# Patient Record
Sex: Female | Born: 1935
Health system: Southern US, Community
[De-identification: ages and names within clinical notes are randomized; demographics above are authoritative.]

## PROBLEM LIST (undated history)

## (undated) DIAGNOSIS — I517 Cardiomegaly: Secondary | ICD-10-CM

## (undated) DIAGNOSIS — I340 Nonrheumatic mitral (valve) insufficiency: Secondary | ICD-10-CM

## (undated) DIAGNOSIS — E538 Deficiency of other specified B group vitamins: Secondary | ICD-10-CM

## (undated) DIAGNOSIS — Z87891 Personal history of nicotine dependence: Secondary | ICD-10-CM

## (undated) DIAGNOSIS — I7090 Unspecified atherosclerosis: Secondary | ICD-10-CM

## (undated) DIAGNOSIS — I519 Heart disease, unspecified: Secondary | ICD-10-CM

## (undated) DIAGNOSIS — I10 Essential (primary) hypertension: Secondary | ICD-10-CM

## (undated) DIAGNOSIS — N183 Chronic kidney disease, stage 3 unspecified: Secondary | ICD-10-CM

## (undated) DIAGNOSIS — I739 Peripheral vascular disease, unspecified: Secondary | ICD-10-CM

## (undated) DIAGNOSIS — E785 Hyperlipidemia, unspecified: Secondary | ICD-10-CM

## (undated) DIAGNOSIS — E669 Obesity, unspecified: Secondary | ICD-10-CM

## (undated) DIAGNOSIS — E559 Vitamin D deficiency, unspecified: Secondary | ICD-10-CM

## (undated) DIAGNOSIS — R7301 Impaired fasting glucose: Secondary | ICD-10-CM

## (undated) DIAGNOSIS — I701 Atherosclerosis of renal artery: Secondary | ICD-10-CM

## (undated) DIAGNOSIS — I071 Rheumatic tricuspid insufficiency: Secondary | ICD-10-CM

## (undated) DIAGNOSIS — I2510 Atherosclerotic heart disease of native coronary artery without angina pectoris: Secondary | ICD-10-CM

## (undated) HISTORY — DX: Rheumatic tricuspid insufficiency: I07.1

## (undated) HISTORY — DX: Essential (primary) hypertension: I10

## (undated) HISTORY — PX: LAPAROSCOPY: SHX197

## (undated) HISTORY — DX: Personal history of nicotine dependence: Z87.891

## (undated) HISTORY — DX: Deficiency of other specified B group vitamins: E53.8

## (undated) HISTORY — DX: Cardiomegaly: I51.7

## (undated) HISTORY — DX: Heart disease, unspecified: I51.9

## (undated) HISTORY — DX: Atherosclerotic heart disease of native coronary artery without angina pectoris: I25.10

## (undated) HISTORY — DX: Peripheral vascular disease, unspecified: I73.9

## (undated) HISTORY — DX: Chronic kidney disease, stage 3 (moderate): N18.3

## (undated) HISTORY — DX: Obesity, unspecified: E66.9

## (undated) HISTORY — DX: Vitamin D deficiency, unspecified: E55.9

## (undated) HISTORY — DX: Unspecified atherosclerosis: I70.90

## (undated) HISTORY — DX: Hyperlipidemia, unspecified: E78.5

## (undated) HISTORY — DX: Atherosclerosis of renal artery: I70.1

## (undated) HISTORY — DX: Impaired fasting glucose: R73.01

## (undated) HISTORY — DX: Nonrheumatic mitral (valve) insufficiency: I34.0

## (undated) HISTORY — DX: Chronic kidney disease, stage 3 unspecified: N18.30

## (undated) HISTORY — PX: CERVICAL DISCECTOMY: SHX98

---

## 2001-09-06 HISTORY — PX: ILIAC ARTERY STENT: SHX1786

## 2002-10-07 HISTORY — PX: OTHER SURGICAL HISTORY: SHX169

## 2004-10-06 ENCOUNTER — Ambulatory Visit: Payer: Self-pay | Admitting: Family Medicine

## 2005-07-05 ENCOUNTER — Ambulatory Visit: Payer: Self-pay | Admitting: Family Medicine

## 2005-12-16 ENCOUNTER — Ambulatory Visit: Payer: Self-pay | Admitting: Family Medicine

## 2007-01-03 ENCOUNTER — Ambulatory Visit: Payer: Self-pay | Admitting: Family Medicine

## 2008-03-05 ENCOUNTER — Ambulatory Visit: Payer: Self-pay | Admitting: Family Medicine

## 2009-03-06 ENCOUNTER — Ambulatory Visit: Payer: Self-pay | Admitting: Family Medicine

## 2011-09-27 DIAGNOSIS — E7800 Pure hypercholesterolemia, unspecified: Secondary | ICD-10-CM | POA: Diagnosis not present

## 2011-09-27 DIAGNOSIS — Z79899 Other long term (current) drug therapy: Secondary | ICD-10-CM | POA: Diagnosis not present

## 2011-09-29 DIAGNOSIS — I10 Essential (primary) hypertension: Secondary | ICD-10-CM | POA: Diagnosis not present

## 2012-01-07 DIAGNOSIS — H40019 Open angle with borderline findings, low risk, unspecified eye: Secondary | ICD-10-CM | POA: Diagnosis not present

## 2012-02-07 DIAGNOSIS — E7800 Pure hypercholesterolemia, unspecified: Secondary | ICD-10-CM | POA: Diagnosis not present

## 2012-02-07 DIAGNOSIS — R7989 Other specified abnormal findings of blood chemistry: Secondary | ICD-10-CM | POA: Diagnosis not present

## 2012-02-09 DIAGNOSIS — I10 Essential (primary) hypertension: Secondary | ICD-10-CM | POA: Diagnosis not present

## 2012-02-11 DIAGNOSIS — M76899 Other specified enthesopathies of unspecified lower limb, excluding foot: Secondary | ICD-10-CM | POA: Diagnosis not present

## 2012-06-14 DIAGNOSIS — Z23 Encounter for immunization: Secondary | ICD-10-CM | POA: Diagnosis not present

## 2013-01-17 DIAGNOSIS — H40019 Open angle with borderline findings, low risk, unspecified eye: Secondary | ICD-10-CM | POA: Diagnosis not present

## 2013-01-17 DIAGNOSIS — H2510 Age-related nuclear cataract, unspecified eye: Secondary | ICD-10-CM | POA: Diagnosis not present

## 2013-02-22 DIAGNOSIS — E669 Obesity, unspecified: Secondary | ICD-10-CM | POA: Diagnosis not present

## 2013-02-22 DIAGNOSIS — I10 Essential (primary) hypertension: Secondary | ICD-10-CM | POA: Diagnosis not present

## 2013-02-22 DIAGNOSIS — E7800 Pure hypercholesterolemia, unspecified: Secondary | ICD-10-CM | POA: Diagnosis not present

## 2013-02-22 DIAGNOSIS — R7301 Impaired fasting glucose: Secondary | ICD-10-CM | POA: Diagnosis not present

## 2013-02-22 DIAGNOSIS — Z79899 Other long term (current) drug therapy: Secondary | ICD-10-CM | POA: Diagnosis not present

## 2013-02-22 DIAGNOSIS — F17200 Nicotine dependence, unspecified, uncomplicated: Secondary | ICD-10-CM | POA: Diagnosis not present

## 2013-03-21 DIAGNOSIS — I10 Essential (primary) hypertension: Secondary | ICD-10-CM | POA: Diagnosis not present

## 2013-03-21 DIAGNOSIS — E7800 Pure hypercholesterolemia, unspecified: Secondary | ICD-10-CM | POA: Diagnosis not present

## 2013-05-31 DIAGNOSIS — Z23 Encounter for immunization: Secondary | ICD-10-CM | POA: Diagnosis not present

## 2013-09-20 DIAGNOSIS — E7800 Pure hypercholesterolemia, unspecified: Secondary | ICD-10-CM | POA: Diagnosis not present

## 2013-09-20 DIAGNOSIS — F17200 Nicotine dependence, unspecified, uncomplicated: Secondary | ICD-10-CM | POA: Diagnosis not present

## 2013-09-20 DIAGNOSIS — I10 Essential (primary) hypertension: Secondary | ICD-10-CM | POA: Diagnosis not present

## 2013-09-20 DIAGNOSIS — R7301 Impaired fasting glucose: Secondary | ICD-10-CM | POA: Diagnosis not present

## 2014-01-15 DIAGNOSIS — H40019 Open angle with borderline findings, low risk, unspecified eye: Secondary | ICD-10-CM | POA: Diagnosis not present

## 2014-01-15 DIAGNOSIS — H2510 Age-related nuclear cataract, unspecified eye: Secondary | ICD-10-CM | POA: Diagnosis not present

## 2014-03-18 DIAGNOSIS — I10 Essential (primary) hypertension: Secondary | ICD-10-CM | POA: Diagnosis not present

## 2014-03-18 DIAGNOSIS — R7301 Impaired fasting glucose: Secondary | ICD-10-CM | POA: Diagnosis not present

## 2014-03-18 DIAGNOSIS — E7800 Pure hypercholesterolemia, unspecified: Secondary | ICD-10-CM | POA: Diagnosis not present

## 2014-05-21 DIAGNOSIS — R3915 Urgency of urination: Secondary | ICD-10-CM | POA: Diagnosis not present

## 2014-05-21 DIAGNOSIS — E7800 Pure hypercholesterolemia, unspecified: Secondary | ICD-10-CM | POA: Diagnosis not present

## 2014-05-21 DIAGNOSIS — I10 Essential (primary) hypertension: Secondary | ICD-10-CM | POA: Diagnosis not present

## 2014-05-21 DIAGNOSIS — N183 Chronic kidney disease, stage 3 unspecified: Secondary | ICD-10-CM | POA: Diagnosis not present

## 2014-05-28 DIAGNOSIS — Z23 Encounter for immunization: Secondary | ICD-10-CM | POA: Diagnosis not present

## 2014-07-22 DIAGNOSIS — E559 Vitamin D deficiency, unspecified: Secondary | ICD-10-CM | POA: Diagnosis not present

## 2014-07-22 DIAGNOSIS — I10 Essential (primary) hypertension: Secondary | ICD-10-CM | POA: Diagnosis not present

## 2014-07-22 DIAGNOSIS — N183 Chronic kidney disease, stage 3 (moderate): Secondary | ICD-10-CM | POA: Diagnosis not present

## 2014-07-22 DIAGNOSIS — E669 Obesity, unspecified: Secondary | ICD-10-CM | POA: Diagnosis not present

## 2014-07-22 DIAGNOSIS — R413 Other amnesia: Secondary | ICD-10-CM | POA: Diagnosis not present

## 2014-07-22 DIAGNOSIS — E663 Overweight: Secondary | ICD-10-CM | POA: Diagnosis not present

## 2014-07-22 DIAGNOSIS — E780 Pure hypercholesterolemia: Secondary | ICD-10-CM | POA: Diagnosis not present

## 2014-08-12 DIAGNOSIS — Z0131 Encounter for examination of blood pressure with abnormal findings: Secondary | ICD-10-CM | POA: Diagnosis not present

## 2014-08-12 DIAGNOSIS — N183 Chronic kidney disease, stage 3 (moderate): Secondary | ICD-10-CM | POA: Diagnosis not present

## 2014-08-12 DIAGNOSIS — I10 Essential (primary) hypertension: Secondary | ICD-10-CM | POA: Diagnosis not present

## 2014-08-14 DIAGNOSIS — I701 Atherosclerosis of renal artery: Secondary | ICD-10-CM | POA: Diagnosis not present

## 2014-08-19 DIAGNOSIS — N183 Chronic kidney disease, stage 3 (moderate): Secondary | ICD-10-CM | POA: Diagnosis not present

## 2014-08-21 ENCOUNTER — Ambulatory Visit: Payer: Self-pay | Admitting: Family Medicine

## 2014-08-21 DIAGNOSIS — I358 Other nonrheumatic aortic valve disorders: Secondary | ICD-10-CM | POA: Diagnosis not present

## 2014-08-21 DIAGNOSIS — I071 Rheumatic tricuspid insufficiency: Secondary | ICD-10-CM | POA: Diagnosis not present

## 2014-08-21 DIAGNOSIS — I340 Nonrheumatic mitral (valve) insufficiency: Secondary | ICD-10-CM | POA: Diagnosis not present

## 2014-08-22 ENCOUNTER — Encounter: Payer: Self-pay | Admitting: *Deleted

## 2014-09-04 ENCOUNTER — Ambulatory Visit: Payer: Self-pay | Admitting: Family Medicine

## 2014-09-04 DIAGNOSIS — K760 Fatty (change of) liver, not elsewhere classified: Secondary | ICD-10-CM | POA: Diagnosis not present

## 2014-09-04 DIAGNOSIS — I2510 Atherosclerotic heart disease of native coronary artery without angina pectoris: Secondary | ICD-10-CM | POA: Diagnosis not present

## 2014-09-04 DIAGNOSIS — Z87891 Personal history of nicotine dependence: Secondary | ICD-10-CM | POA: Diagnosis not present

## 2014-09-04 DIAGNOSIS — I739 Peripheral vascular disease, unspecified: Secondary | ICD-10-CM | POA: Diagnosis not present

## 2014-09-09 DIAGNOSIS — I10 Essential (primary) hypertension: Secondary | ICD-10-CM | POA: Diagnosis not present

## 2014-09-09 DIAGNOSIS — N183 Chronic kidney disease, stage 3 (moderate): Secondary | ICD-10-CM | POA: Diagnosis not present

## 2014-09-09 DIAGNOSIS — R809 Proteinuria, unspecified: Secondary | ICD-10-CM | POA: Diagnosis not present

## 2014-09-18 DIAGNOSIS — E782 Mixed hyperlipidemia: Secondary | ICD-10-CM | POA: Diagnosis not present

## 2014-09-18 DIAGNOSIS — R079 Chest pain, unspecified: Secondary | ICD-10-CM | POA: Diagnosis not present

## 2014-09-18 DIAGNOSIS — I739 Peripheral vascular disease, unspecified: Secondary | ICD-10-CM | POA: Diagnosis not present

## 2014-09-18 DIAGNOSIS — I10 Essential (primary) hypertension: Secondary | ICD-10-CM | POA: Diagnosis not present

## 2014-09-23 DIAGNOSIS — I739 Peripheral vascular disease, unspecified: Secondary | ICD-10-CM | POA: Insufficient documentation

## 2014-09-23 DIAGNOSIS — E782 Mixed hyperlipidemia: Secondary | ICD-10-CM | POA: Insufficient documentation

## 2014-09-23 DIAGNOSIS — I10 Essential (primary) hypertension: Secondary | ICD-10-CM | POA: Insufficient documentation

## 2014-09-23 DIAGNOSIS — R0789 Other chest pain: Secondary | ICD-10-CM | POA: Diagnosis not present

## 2014-10-16 DIAGNOSIS — I739 Peripheral vascular disease, unspecified: Secondary | ICD-10-CM | POA: Diagnosis not present

## 2014-10-16 DIAGNOSIS — E782 Mixed hyperlipidemia: Secondary | ICD-10-CM | POA: Diagnosis not present

## 2014-10-16 DIAGNOSIS — I10 Essential (primary) hypertension: Secondary | ICD-10-CM | POA: Diagnosis not present

## 2014-10-28 DIAGNOSIS — I10 Essential (primary) hypertension: Secondary | ICD-10-CM | POA: Diagnosis not present

## 2014-10-28 DIAGNOSIS — E785 Hyperlipidemia, unspecified: Secondary | ICD-10-CM | POA: Diagnosis not present

## 2014-11-12 DIAGNOSIS — I10 Essential (primary) hypertension: Secondary | ICD-10-CM | POA: Diagnosis not present

## 2014-11-12 DIAGNOSIS — I739 Peripheral vascular disease, unspecified: Secondary | ICD-10-CM | POA: Diagnosis not present

## 2014-11-12 DIAGNOSIS — E782 Mixed hyperlipidemia: Secondary | ICD-10-CM | POA: Diagnosis not present

## 2014-11-14 DIAGNOSIS — R809 Proteinuria, unspecified: Secondary | ICD-10-CM | POA: Diagnosis not present

## 2014-11-14 DIAGNOSIS — R609 Edema, unspecified: Secondary | ICD-10-CM | POA: Diagnosis not present

## 2014-11-14 DIAGNOSIS — N183 Chronic kidney disease, stage 3 (moderate): Secondary | ICD-10-CM | POA: Diagnosis not present

## 2014-11-14 DIAGNOSIS — I10 Essential (primary) hypertension: Secondary | ICD-10-CM | POA: Diagnosis not present

## 2014-12-05 DIAGNOSIS — E785 Hyperlipidemia, unspecified: Secondary | ICD-10-CM | POA: Diagnosis not present

## 2014-12-05 DIAGNOSIS — I6529 Occlusion and stenosis of unspecified carotid artery: Secondary | ICD-10-CM | POA: Diagnosis not present

## 2014-12-05 DIAGNOSIS — I722 Aneurysm of renal artery: Secondary | ICD-10-CM | POA: Diagnosis not present

## 2014-12-05 DIAGNOSIS — I70213 Atherosclerosis of native arteries of extremities with intermittent claudication, bilateral legs: Secondary | ICD-10-CM | POA: Diagnosis not present

## 2014-12-05 DIAGNOSIS — I150 Renovascular hypertension: Secondary | ICD-10-CM | POA: Diagnosis not present

## 2014-12-05 DIAGNOSIS — E669 Obesity, unspecified: Secondary | ICD-10-CM | POA: Diagnosis not present

## 2014-12-06 HISTORY — PX: OTHER SURGICAL HISTORY: SHX169

## 2014-12-10 DIAGNOSIS — E782 Mixed hyperlipidemia: Secondary | ICD-10-CM | POA: Diagnosis not present

## 2014-12-10 DIAGNOSIS — I10 Essential (primary) hypertension: Secondary | ICD-10-CM | POA: Diagnosis not present

## 2014-12-10 DIAGNOSIS — I739 Peripheral vascular disease, unspecified: Secondary | ICD-10-CM | POA: Diagnosis not present

## 2014-12-17 ENCOUNTER — Ambulatory Visit
Admit: 2014-12-17 | Disposition: A | Discharge status: Home / Self Care | Payer: Self-pay | Attending: Vascular Surgery | Admitting: Vascular Surgery

## 2014-12-17 DIAGNOSIS — I739 Peripheral vascular disease, unspecified: Secondary | ICD-10-CM | POA: Diagnosis not present

## 2014-12-17 DIAGNOSIS — I701 Atherosclerosis of renal artery: Secondary | ICD-10-CM | POA: Diagnosis not present

## 2014-12-17 DIAGNOSIS — I10 Essential (primary) hypertension: Secondary | ICD-10-CM | POA: Diagnosis not present

## 2014-12-17 DIAGNOSIS — Z79899 Other long term (current) drug therapy: Secondary | ICD-10-CM | POA: Diagnosis not present

## 2014-12-17 DIAGNOSIS — J449 Chronic obstructive pulmonary disease, unspecified: Secondary | ICD-10-CM | POA: Diagnosis not present

## 2014-12-17 DIAGNOSIS — E785 Hyperlipidemia, unspecified: Secondary | ICD-10-CM | POA: Diagnosis not present

## 2014-12-17 DIAGNOSIS — E669 Obesity, unspecified: Secondary | ICD-10-CM | POA: Diagnosis not present

## 2014-12-17 LAB — BUN: BUN: 18 mg/dL

## 2014-12-17 LAB — CREATININE, SERUM
Creatinine: 0.86 mg/dL
EGFR (African American): >60
EGFR (Non-African Amer.): >60

## 2014-12-20 ENCOUNTER — Ambulatory Visit
Admit: 2014-12-20 | Disposition: A | Discharge status: Home / Self Care | Payer: Self-pay | Attending: Vascular Surgery | Admitting: Vascular Surgery

## 2014-12-20 DIAGNOSIS — I6529 Occlusion and stenosis of unspecified carotid artery: Secondary | ICD-10-CM | POA: Diagnosis not present

## 2014-12-20 DIAGNOSIS — J449 Chronic obstructive pulmonary disease, unspecified: Secondary | ICD-10-CM | POA: Diagnosis not present

## 2014-12-20 DIAGNOSIS — I10 Essential (primary) hypertension: Secondary | ICD-10-CM | POA: Diagnosis not present

## 2014-12-20 DIAGNOSIS — Z79899 Other long term (current) drug therapy: Secondary | ICD-10-CM | POA: Diagnosis not present

## 2014-12-20 DIAGNOSIS — I7090 Unspecified atherosclerosis: Secondary | ICD-10-CM | POA: Diagnosis not present

## 2014-12-20 DIAGNOSIS — I499 Cardiac arrhythmia, unspecified: Secondary | ICD-10-CM | POA: Diagnosis not present

## 2014-12-20 DIAGNOSIS — I70293 Other atherosclerosis of native arteries of extremities, bilateral legs: Secondary | ICD-10-CM | POA: Diagnosis not present

## 2014-12-20 DIAGNOSIS — I7100 Dissection of unspecified site of aorta: Secondary | ICD-10-CM | POA: Diagnosis not present

## 2014-12-20 DIAGNOSIS — E785 Hyperlipidemia, unspecified: Secondary | ICD-10-CM | POA: Diagnosis not present

## 2014-12-20 DIAGNOSIS — E669 Obesity, unspecified: Secondary | ICD-10-CM | POA: Diagnosis not present

## 2014-12-20 DIAGNOSIS — I701 Atherosclerosis of renal artery: Secondary | ICD-10-CM | POA: Diagnosis not present

## 2014-12-21 DIAGNOSIS — I70293 Other atherosclerosis of native arteries of extremities, bilateral legs: Secondary | ICD-10-CM | POA: Diagnosis not present

## 2014-12-21 DIAGNOSIS — E785 Hyperlipidemia, unspecified: Secondary | ICD-10-CM | POA: Diagnosis not present

## 2014-12-21 DIAGNOSIS — Z79899 Other long term (current) drug therapy: Secondary | ICD-10-CM | POA: Diagnosis not present

## 2014-12-21 DIAGNOSIS — I6529 Occlusion and stenosis of unspecified carotid artery: Secondary | ICD-10-CM | POA: Diagnosis not present

## 2014-12-21 DIAGNOSIS — I10 Essential (primary) hypertension: Secondary | ICD-10-CM | POA: Diagnosis not present

## 2014-12-21 DIAGNOSIS — I701 Atherosclerosis of renal artery: Secondary | ICD-10-CM | POA: Diagnosis not present

## 2014-12-30 DIAGNOSIS — E669 Obesity, unspecified: Secondary | ICD-10-CM | POA: Diagnosis not present

## 2014-12-30 DIAGNOSIS — I7100 Dissection of unspecified site of aorta: Secondary | ICD-10-CM | POA: Diagnosis not present

## 2014-12-30 DIAGNOSIS — I150 Renovascular hypertension: Secondary | ICD-10-CM | POA: Diagnosis not present

## 2014-12-30 DIAGNOSIS — I701 Atherosclerosis of renal artery: Secondary | ICD-10-CM | POA: Diagnosis not present

## 2014-12-30 DIAGNOSIS — I7090 Unspecified atherosclerosis: Secondary | ICD-10-CM | POA: Diagnosis not present

## 2014-12-30 DIAGNOSIS — E785 Hyperlipidemia, unspecified: Secondary | ICD-10-CM | POA: Diagnosis not present

## 2014-12-30 DIAGNOSIS — I10 Essential (primary) hypertension: Secondary | ICD-10-CM | POA: Diagnosis not present

## 2015-01-05 NOTE — Op Note (Signed)
PATIENT NAME:  Angela Duffy, GELINAS MR#:  [id number] DATE OF BIRTH:  Jan 28, 1935  DATE OF PROCEDURE:  12/17/2014  PREOPERATIVE DIAGNOSES:  1. Malignant hypertension.  2. Renal artery stenosis.  3. Peripheral vascular disease.    POSTOPERATIVE DIAGNOSES: 1. Malignant hypertension.  2. Renal artery stenosis.  3. Peripheral vascular disease.   4. Complication, vascular device with occlusion of aortic stent.   PROCEDURE PERFORMED:   1. Introduction catheter into right common femoral artery.  2. Introduction catheter into left common femoral artery.   SURGEON:  Katha Cabal, MD.  SEDATION:   Versed plus fentanyl.   CONTRAST USED:  Isovue 12 mL.   FLUOROSCOPY TIME:  8.2 minutes.   INDICATIONS:  Ms. Hladik is a 80 year old woman with malignant hypertension and renal artery stenosis by noninvasive studies.  She is presenting for intervention of the renal artery  for treatment of her blood pressure.  The risks and benefits are reviewed.  All questions answered.  The patient agrees to proceed.   DESCRIPTION OF PROCEDURE:  The patient is taken to special procedures and placed in the supine position.  After adequate sedation is achieved, both groins are prepped and draped in a sterile fashion.  Ultrasound is placed in a sterile sleeve and the left common femoral artery is identified.  It is echolucent and pulsatile indicating patency, images recorded for the permanent record, and under real-time visualization, a microneedle is inserted.  Microwire is inserted, microsheath followed by a J-wire and a 5-French sheath.  The wire will not advance, so a catheter is utilized.  Hand injection through the catheter demonstrates occlusion of the common iliac.  Therefore, attempts to obtain access to the renal artery from the right side are now undertaken.  Ultrasound is utilized.  The femoral is noted to be pulsatile.  Images recorded for the permanent record and access is obtained in a similar  fashion.  Again, a sheath is utilized. Catheter and wire are advanced; however, once again there is non-crossing of the common and therefore hand injection is utilized which demonstrates occlusion of the common.  Attempts at crossing these lesions are unsuccessful and therefore access to the renal artery is not available at this point, and the procedure is terminated.  Sheaths are pulled, pressure is held, and there are no immediate complications.   INTERPRETATION:  Imaging demonstrates bilateral common iliac artery occlusion.  Stent is in the aorta which is likely also occluded.  Patient will be rescheduled for a brachial approach with an open cutdown in order to obtain access to the right renal artery.    ____________________________ Katha Cabal, MD ggs:kc D: 12/18/2014 17:56:00 ET T: 12/18/2014 22:46:50 ET JOB#: [id number]  cc: Katha Cabal, MD, <Dictator> Katha Cabal MD ELECTRONICALLY SIGNED 12/24/2014 12:55

## 2015-01-05 NOTE — Op Note (Signed)
PATIENT NAME:  Angela Duffy, Angela Duffy MR#:  [id number] DATE OF BIRTH:  04/29/1935  PREOPERATIVE DIAGNOSES:  1. Malignant hypertension.  2. Renal artery stenosis.  3. Peripheral vascular disease.    POSTOPERATIVE DIAGNOSES: 1. Malignant hypertension.  2. Renal artery stenosis.  3. Peripheral vascular disease.   4. Complication, vascular device with occlusion of aortic stent.   PROCEDURE PERFORMED:   1. Introduction catheter into right common femoral artery.  2. Introduction catheter into left common femoral artery.   SURGEON:  Katha Cabal, MD.  SEDATION:   Versed plus fentanyl.   CONTRAST USED:  Isovue 12 mL.   FLUOROSCOPY TIME:  8.2 minutes.   INDICATIONS:  [name] is a 80 year old woman with malignant hypertension and renal artery stenosis by noninvasive studies.  She is presenting for intervention of the renal artery  for treatment of her blood pressure.  The risks and benefits are reviewed.  All questions answered.  The patient agrees to proceed.   DESCRIPTION OF PROCEDURE:  The patient is taken to special procedures and placed in the supine position.  After adequate sedation is achieved, both groins are prepped and draped in a sterile fashion.  Ultrasound is placed in a sterile sleeve and the left common femoral artery is identified.  It is echolucent and pulsatile indicating patency, images recorded for the permanent record, and under real-time visualization, a microneedle is inserted.  Microwire is inserted, microsheath followed by a J-wire and a 5-French sheath.  The wire will not advance, so a catheter is utilized.  Hand injection through the catheter demonstrates occlusion of the common iliac.  Therefore, attempts to obtain access to the renal artery from the right side are now undertaken.  Ultrasound is utilized.  The femoral is noted to be pulsatile.  Images recorded for the permanent record and access is obtained in a similar fashion.  Again, a sheath is utilized.  Catheter and wire are advanced; however, once again there is non-crossing of the common and therefore hand injection is utilized which demonstrates occlusion of the common.  Attempts at crossing these lesions are unsuccessful and therefore access to the renal artery is not available at this point, and the procedure is terminated.  Sheaths are pulled, pressure is held, and there are no immediate complications.   INTERPRETATION:  Imaging demonstrates bilateral common iliac artery occlusion.  Stent is in the aorta which is likely also occluded.  Patient will be rescheduled for a brachial approach with an open cutdown in order to obtain access to the right renal artery.    ____________________________ Katha Cabal, MD ggs:kc D: 12/18/2014 17:56:54 ET T: 12/18/2014 22:46:50 ET JOB#: [id number]  cc: Katha Cabal, MD, <Dictator>

## 2015-01-05 NOTE — Op Note (Signed)
PATIENT NAME:  Angela Duffy, CARLINO MR#:  [id number] DATE OF BIRTH:  [birth year]  DATE OF PROCEDURE:  12/20/2014  PREOPERATIVE DIAGNOSES:  1.  Malignant hypertension.  2.  Renal artery stenosis.  3.  Atherosclerotic occlusive disease with distal aortic occlusion.   POSTOPERATIVE DIAGNOSES: 1.  Malignant hypertension.  2.  Renal artery stenosis.  3.  Atherosclerotic occlusive disease with distal aortic occlusion  PROCEDURES PERFORMED:  1.  Open exposure of left brachial artery via cutdown.  2.  Introduction catheter into renal artery for selective imaging.  3.  Angioplasty and stent placement right renal artery.   SURGEON: Hortencia Pilar, M.D.   SEDATION:  Anesthesia was present for MAC anesthesia.   CONTRAST USED:  53 mL Isovue.   FLUOROSCOPY TIME:  7.9 minutes.   ACCESS:  Open brachial artery 6-French sheath left side.   INDICATIONS FOR PROCEDURE:  [name] is a 80 year old woman with profound hypertension and consistently having systolics in the 474 range who is presenting for treatment of her renal artery stenosis.  She recently underwent angiography from a femoral approach and was found to have distal aortic occlusion.  Therefore she is now undergoing approach via the brachial artery.  The risks and benefits are reviewed.  All questions answered.  The patient has agreed to proceed.   DESCRIPTION OF PROCEDURE:  The patient is taken to special procedures and placed in the supine position after adequate anesthesia is induced and appropriate invasive monitors have been placed.  She is positioned supine with her left arm extended, palm upward.  The left arm is prepped and draped in a sterile fashion.  Appropriate timeout is called.   A small linear incision is created just above the antecubital crease medially and the brachial artery is exposed.  It is then looped proximally and distally.  Using a micropuncture needle, access to the brachial artery is obtained.  A microwire was  followed by micro sheath.  J-wire was followed by a 6-French sheath.  Once access has been secured, 4000 units of heparin was given.   Working with [employer] and a Kumpe catheter, the wire and catheter are negotiated into the aortic arch and then down the descending.  Wire is then advanced down into the distal aorta and subsequently a 6-French sheath is advanced.  AP projection of the aorta is obtained through a pigtail catheter.  The renal arteries are localized and initially diagnostic imaging is performed.  With the pigtail catheter positioned just above the renal arteries, magnified imaging in several different views is obtained of the left from the catheter in the aorta, verifying, although there atherosclerotic disease present, it does not appear to exceed 30% diameter reduction.   A multipurpose catheter and Glidewire are then advanced into the right renal artery where distal imaging is obtained verifying that the stenosis is at the origin and distally the renal artery is patent.  A 0.014 wire is then exchanged for the Glide.  The sheath is advanced down to just above the origin of the right renal artery, and magnified imaging is obtained on the right.  A 5 x 18 Herculink stent is then selected, advanced across the lesion, and inflated to 12 atmospheres for 30 seconds.  Followup imaging demonstrates excellent result.  There is no less than 5% residual stenosis.   The wire and sheath are then removed.  The brachial artery is controlled with the Silastic      vessel loops and 3 interrupted 6-0 Prolene sutures were used  to repair the brachial artery.  The wound was then irrigated.  Surgicel was placed and the subcutaneous tissues were reapproximated with 3-0 Vicryl and the skin was closed with 4-0 Monocryl subcuticular and Dermabond was applied.  The patient tolerated the procedure well.  There were no other immediate complications.   INTERPRETATION:  The initial imaging in the AP projection demonstrates  diffuse atherosclerotic changes.  Previously placed aortic stent is noted.  It is occluded.  The left renal artery as noted above under magnified imaging in different views demonstrates approximately a 30% diameter reduction.  The right was greater than 90.  Following angioplasty and stent placement, there appears to be less than 5% residual stenosis with rapid flow to the right kidney. Distal runoff is preserved.   SUMMARY:  Successful open angioplasty and stent placement right renal artery.    ____________________________ Katha Cabal, MD ggs:852 D: 12/23/2014 09:14:33 ET T: 12/23/2014 15:47:45 ET JOB#: [id number]  cc: Katha Cabal, MD, <Dictator> Katha Cabal MD ELECTRONICALLY SIGNED 12/24/2014 12:56

## 2015-01-05 NOTE — Discharge Summary (Signed)
Dates of Admission and Diagnosis:  Date of Admission 20-Dec-2014   Date of Discharge 21-Dec-2014   Admitting Diagnosis Renal Artery Stenosis/ Hypertension   Final Diagnosis Renal Artery Stenosis/Hypertension    Chief Complaint/History of Present Illness Patient with hypertension noted to have severe renal artery stenosis. Admitted for right renal artery angioplasty and stenting.   Allergies:  Penicillin: Rash  Neomycin: Itching, Rash, Swelling  Metoprolol: Swelling  Sulfa drugs: N/V/Diarrhea  Cozaar: Swelling  Lisinopril: Dizzy/Fainting  Felodipine: Swelling  Pertinent Past History:  Pertinent Past History Hypertension   Hospital Course:  Hospital Course Right renal artery stenosis. Patient had left brachial approach right renal artery stenting. She tolerated the procedure well. Remained hemodynamically stable. Discharged on Post operative day 1   Condition on Discharge Good   Code Status:  Code Status Full Code   DISCHARGE INSTRUCTIONS HOME MEDS:  Medication Reconciliation: Patient's Home Medications at Discharge:     Medication Instructions  simvastatin 40 mg oral tablet  1 tab(s) orally once a day (at bedtime)   clonidine 0.1 mg oral tablet  1 tab(s) orally 2 times a day   clopidogrel 75 mg oral tablet  1 tab(s) orally once a day   hydrochlorothiazide-triamterene 25 mg-37.5 mg oral tablet  1 tab(s) orally once a day   aspirin 81 mg oral tablet  1 tab(s) orally once a day   vitamin b12  1 milliliter(s) orally once a day   magnesium gluconate 250 mg oral tablet  1 tab(s) orally once a day     Physician's Instructions:  Home Health? No   Treatments None   Dressing Care may shower in 2 days, keep dry   Home Oxygen? No   Diet Low Fat, Low Cholesterol   Activity Limitations No heavy lifting   Return to Work 1 week   Time frame for Follow Up Appointment 1-2 weeks  Dr. Wynonia Sours, Gregory(Attending Physician): [facility] [address], [place], [place] [address], [place] [phone]  TIME SPENT:  Total Time: Greater than 30 minutes   Electronic Signatures: Esco, Miechia A (MD)  (Signed 16-Apr-16 10:04)  Authored: ADMISSION DATE AND DIAGNOSIS, CHIEF COMPLAINT/HPI, Allergies, PERTINENT PAST HISTORY, HOSPITAL COURSE, DISCHARGE INSTRUCTIONS HOME MEDS, PATIENT INSTRUCTIONS, Follow Up Physician, TIME SPENT   Last Updated: 16-Apr-16 10:04 by Evaristo Bury (MD)

## 2015-01-16 DIAGNOSIS — H40013 Open angle with borderline findings, low risk, bilateral: Secondary | ICD-10-CM | POA: Diagnosis not present

## 2015-01-16 DIAGNOSIS — H2513 Age-related nuclear cataract, bilateral: Secondary | ICD-10-CM | POA: Diagnosis not present

## 2015-01-22 IMAGING — CT CT ANGIO CHEST
2 of 6 series · 15 of 46 positions shown · IV contrast (APPLIED)
Comparison: None.

CLINICAL DATA: Diminished right upper extremity blood pressure
compared to left. History of left carotid endarterectomy, peripheral
vascular disease with prior bilateral iliac stent placement and
long-term smoking.

EXAM:
CT ANGIOGRAPHY CHEST WITH CONTRAST
TECHNIQUE: Multidetector CT imaging of the chest was performed using the
standard protocol during bolus administration of intravenous
contrast. Multiplanar CT image reconstructions and MIPs were
obtained to evaluate the vascular anatomy.
CONTRAST:  100 mL Omnipaque 350 IV

[Series 5: axial arterial · axial · arterial · 0.98mm/px · z∈[-631,-367]mm · 12 of 146 slices shown]
[im 7/146  lung]
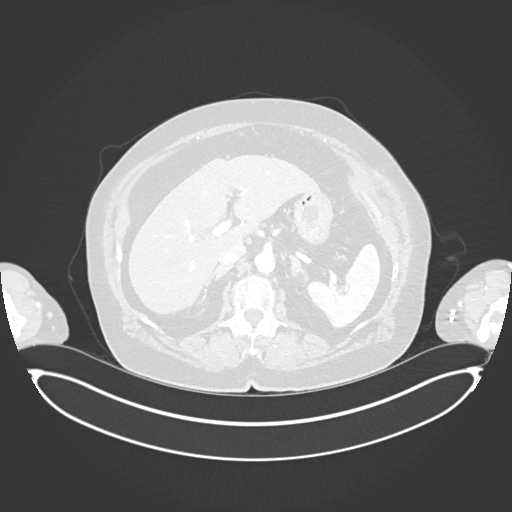
[im 20/146  soft-tissue]
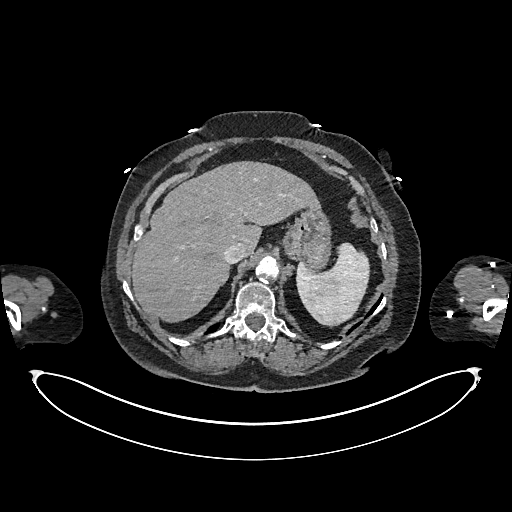
[im 33/146  lung]
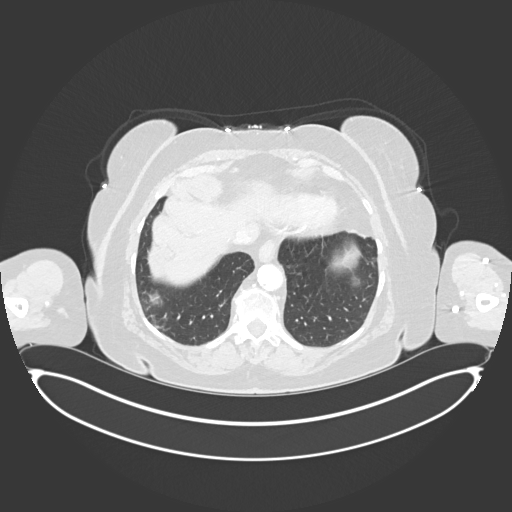
[im 47/146  soft-tissue]
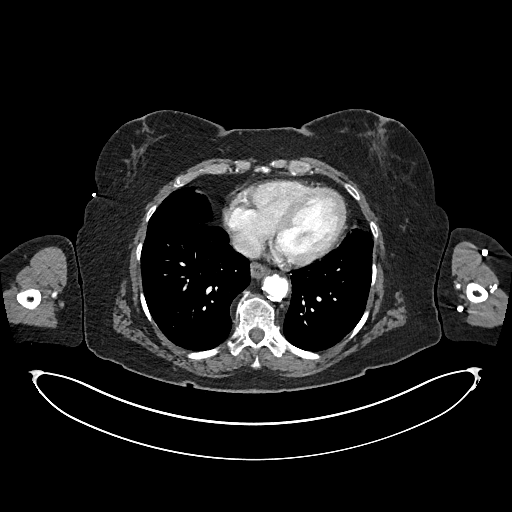
[im 53/146  lung]
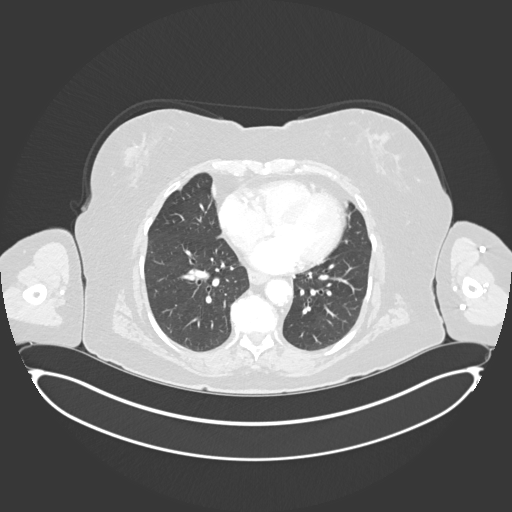
[im 66/146  soft-tissue]
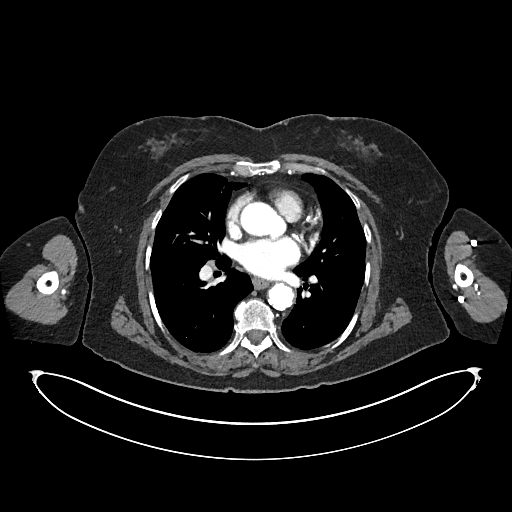
[im 80/146  lung]
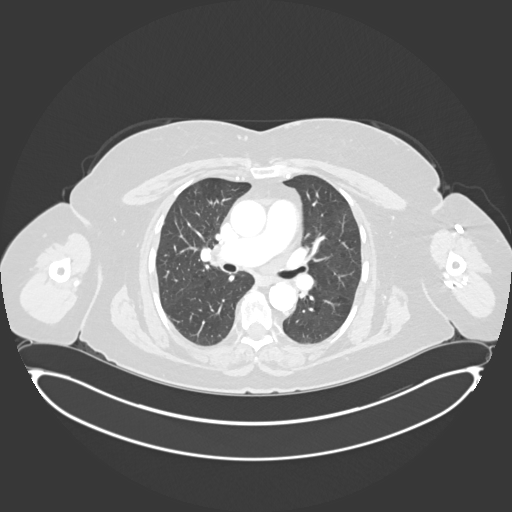
[im 93/146  soft-tissue]
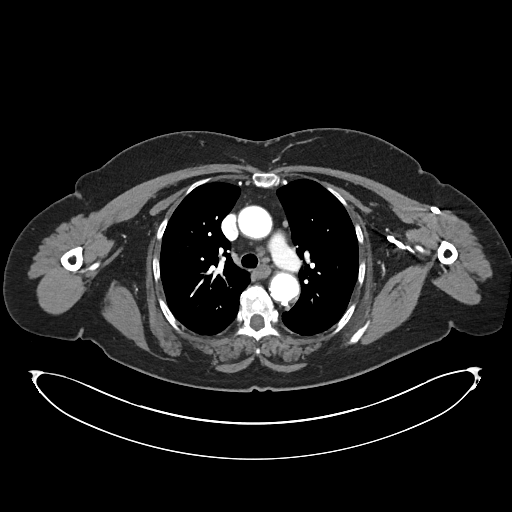
[im 99/146  lung]
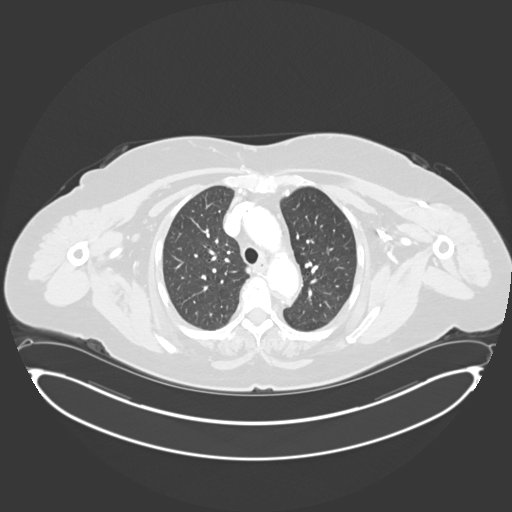
[im 113/146  soft-tissue]
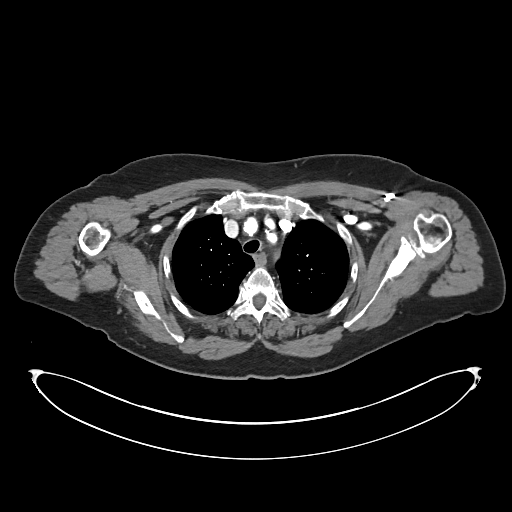
[im 126/146  lung]
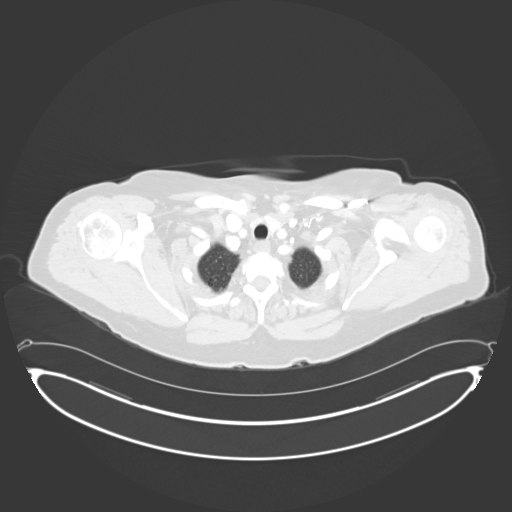
[im 139/146  soft-tissue]
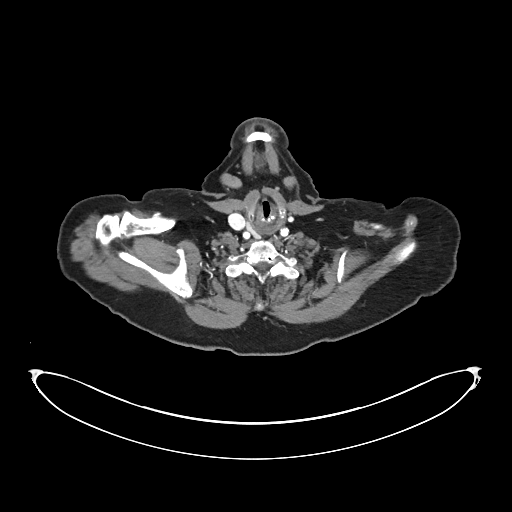

[Series 9: cor mpr · coronal · 0.59mm/px · 3 of 153 slices shown]
[im 39/153  soft-tissue]
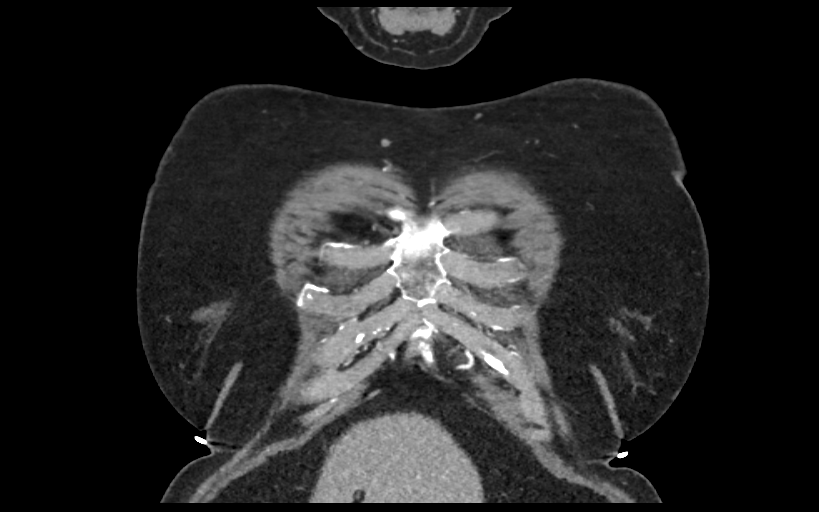
[im 77/153  soft-tissue]
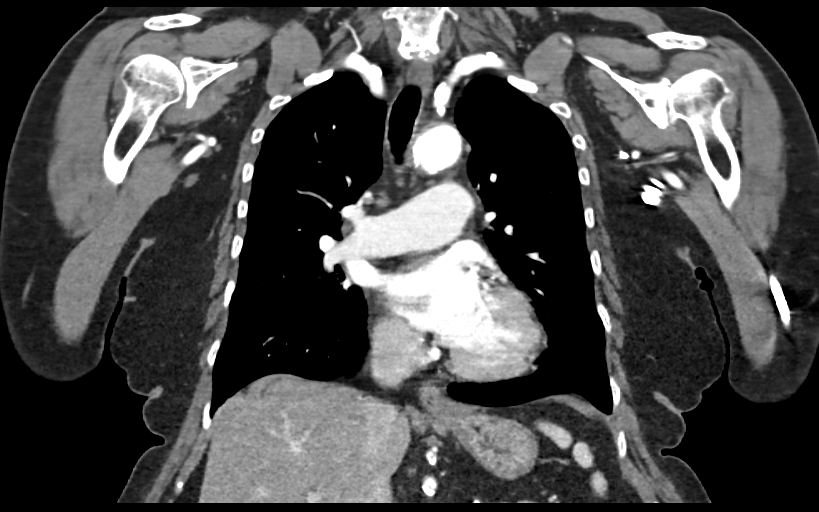
[im 115/153  soft-tissue]
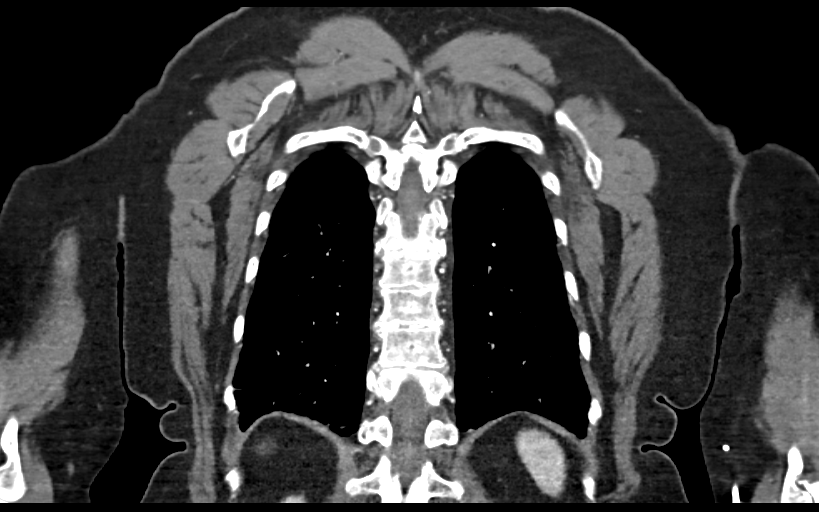

[15 of 46 positions shown; findings below may reference images not displayed]

FINDINGS: The thoracic aorta is heavily calcified with significant
atherosclerosis affecting the arch and descending thoracic aorta. No
aneurysmal disease is identified with maximal ascending aortic
diameter of 3.5 cm. There is a large eccentric calcified plaque at
the origin of the innominate artery with additional component of
soft plaque present. This causes irregular stenosis of approximately
60-70% at the innominate origin. Additional significant obstructive
disease is identified at the origin of the right subclavian artery
with heavily calcified plaque present. Stenosis at the origin
approaches 70- 80% in estimated caliber. Tandem disease involving
the innominate and subclavian origins would account for the
depressed right arm blood pressure relative to the left.

Additional calcified plaque in the right subclavian artery lies at
the vertebral artery origin. The right vertebral artery may be
stenotic at its origin but is open and small in caliber. The
contralateral left vertebral artery is open.

Irregular plaque is seen at the origin of the left common carotid
artery and within the proximal left subclavian artery without
significant stenosis identified. The rest of the visualized common
carotid artery segments axillary arteries and proximal brachial
arteries bilaterally show normal patency.

There is evidence of coronary atherosclerosis with calcified plaque
seen near the origin of the left main coronary artery as well as
calcified plaque throughout most of the proximal LAD and in the
proximal left circumflex artery. Calcified plaque is also suspected
in the right coronary artery. The heart size is normal.

No evidence of pleural or pericardial fluid. Lungs show no evidence
of edema, consolidation or nodule. No enlarged lymph nodes are seen.
Visualized airways are normally patent. No gross esophageal
abnormalities. The thyroid gland appears normal.

The visualized upper abdomen shows steatosis of the liver and
probable early cirrhotic changes of the liver with enlargement of
the left lobe and recanalization of the umbilical vein. Significant
atherosclerosis is identified in the proximal abdominal aorta with
both calcified and noncalcified plaque causing roughly 40% luminal
narrowing just above the celiac axis origin. There likely is
significant stenosis of the proximal celiac axis by plaque
approaching 70- 75% in estimated caliber. The visualized proximal
superior mesenteric artery shows mild disease.

Bony structures show degenerative changes of the thoracic spine
without evidence of fracture or lesion.

Review of the MIP images confirms the above findings.
IMPRESSION: 1. Tandem significant obstructive disease involving origins of both
the innominate artery and right subclavian artery which would
account for the depressed right arm blood pressure. Disease at the
right subclavian origin appears more significant with estimated 70-
80% stenosis. Scattered disease is also seen throughout other great
vessels, as above, an as well as throughout the thoracic aorta.
2. Coronary atherosclerosis with 3 vessel distribution of calcified
plaque is well as calcified plaque near the origin of the left main
coronary artery.
3. Hepatic steatosis and evidence by CT of early cirrhosis of the
liver.
4. Visible proximal celiac axis stenosis of 70- 75%.

## 2015-01-27 DIAGNOSIS — E780 Pure hypercholesterolemia: Secondary | ICD-10-CM | POA: Diagnosis not present

## 2015-01-27 DIAGNOSIS — E559 Vitamin D deficiency, unspecified: Secondary | ICD-10-CM | POA: Diagnosis not present

## 2015-01-27 DIAGNOSIS — I10 Essential (primary) hypertension: Secondary | ICD-10-CM | POA: Diagnosis not present

## 2015-01-27 DIAGNOSIS — I739 Peripheral vascular disease, unspecified: Secondary | ICD-10-CM | POA: Diagnosis not present

## 2015-01-27 DIAGNOSIS — R7301 Impaired fasting glucose: Secondary | ICD-10-CM | POA: Diagnosis not present

## 2015-01-27 DIAGNOSIS — N183 Chronic kidney disease, stage 3 (moderate): Secondary | ICD-10-CM | POA: Diagnosis not present

## 2015-01-27 DIAGNOSIS — T7840XA Allergy, unspecified, initial encounter: Secondary | ICD-10-CM | POA: Diagnosis not present

## 2015-01-27 DIAGNOSIS — E669 Obesity, unspecified: Secondary | ICD-10-CM | POA: Diagnosis not present

## 2015-02-06 ENCOUNTER — Other Ambulatory Visit: Payer: Self-pay | Admitting: *Deleted

## 2015-02-06 MED ORDER — CLOPIDOGREL BISULFATE 75 MG PO TABS: 75.0000 mg | ORAL_TABLET | Freq: Every day | ORAL | Status: DC

## 2015-02-06 NOTE — Telephone Encounter (Signed)
Pt came in office and was concern about her medication Clopidogrel. Pt have rx for a 30 day supply, pt ran out of meds. Pharmacy gave her an emergency supply of meds. She wants to know if Dr. Sanda Klein can prescribe her a 6 month supply of this med to the pharmacy.  Pt requesting a call back

## 2015-03-31 DIAGNOSIS — I150 Renovascular hypertension: Secondary | ICD-10-CM | POA: Diagnosis not present

## 2015-03-31 DIAGNOSIS — I7100 Dissection of unspecified site of aorta: Secondary | ICD-10-CM | POA: Diagnosis not present

## 2015-03-31 DIAGNOSIS — I701 Atherosclerosis of renal artery: Secondary | ICD-10-CM | POA: Diagnosis not present

## 2015-03-31 DIAGNOSIS — E669 Obesity, unspecified: Secondary | ICD-10-CM | POA: Diagnosis not present

## 2015-03-31 DIAGNOSIS — I722 Aneurysm of renal artery: Secondary | ICD-10-CM | POA: Diagnosis not present

## 2015-03-31 DIAGNOSIS — I70213 Atherosclerosis of native arteries of extremities with intermittent claudication, bilateral legs: Secondary | ICD-10-CM | POA: Diagnosis not present

## 2015-03-31 DIAGNOSIS — I7090 Unspecified atherosclerosis: Secondary | ICD-10-CM | POA: Diagnosis not present

## 2015-03-31 DIAGNOSIS — M79609 Pain in unspecified limb: Secondary | ICD-10-CM | POA: Diagnosis not present

## 2015-03-31 DIAGNOSIS — E785 Hyperlipidemia, unspecified: Secondary | ICD-10-CM | POA: Diagnosis not present

## 2015-03-31 DIAGNOSIS — I10 Essential (primary) hypertension: Secondary | ICD-10-CM | POA: Diagnosis not present

## 2015-04-09 ENCOUNTER — Other Ambulatory Visit: Payer: Self-pay

## 2015-04-10 MED ORDER — SIMVASTATIN 40 MG PO TABS: 40.0000 mg | ORAL_TABLET | Freq: Every day | ORAL | Status: DC

## 2015-04-11 DIAGNOSIS — I2510 Atherosclerotic heart disease of native coronary artery without angina pectoris: Secondary | ICD-10-CM | POA: Insufficient documentation

## 2015-04-11 DIAGNOSIS — E669 Obesity, unspecified: Secondary | ICD-10-CM | POA: Insufficient documentation

## 2015-04-11 DIAGNOSIS — I350 Nonrheumatic aortic (valve) stenosis: Secondary | ICD-10-CM | POA: Insufficient documentation

## 2015-04-11 DIAGNOSIS — I739 Peripheral vascular disease, unspecified: Secondary | ICD-10-CM | POA: Insufficient documentation

## 2015-04-11 DIAGNOSIS — I340 Nonrheumatic mitral (valve) insufficiency: Secondary | ICD-10-CM | POA: Insufficient documentation

## 2015-04-11 DIAGNOSIS — I7090 Unspecified atherosclerosis: Secondary | ICD-10-CM | POA: Insufficient documentation

## 2015-04-11 DIAGNOSIS — I10 Essential (primary) hypertension: Secondary | ICD-10-CM | POA: Insufficient documentation

## 2015-04-11 DIAGNOSIS — I701 Atherosclerosis of renal artery: Secondary | ICD-10-CM | POA: Insufficient documentation

## 2015-04-11 DIAGNOSIS — N1831 Chronic kidney disease, stage 3a: Secondary | ICD-10-CM | POA: Insufficient documentation

## 2015-04-11 DIAGNOSIS — I517 Cardiomegaly: Secondary | ICD-10-CM | POA: Insufficient documentation

## 2015-04-11 DIAGNOSIS — E785 Hyperlipidemia, unspecified: Secondary | ICD-10-CM | POA: Insufficient documentation

## 2015-04-11 DIAGNOSIS — I519 Heart disease, unspecified: Secondary | ICD-10-CM | POA: Insufficient documentation

## 2015-04-11 DIAGNOSIS — I071 Rheumatic tricuspid insufficiency: Secondary | ICD-10-CM | POA: Insufficient documentation

## 2015-04-11 DIAGNOSIS — E559 Vitamin D deficiency, unspecified: Secondary | ICD-10-CM | POA: Insufficient documentation

## 2015-04-11 DIAGNOSIS — R7301 Impaired fasting glucose: Secondary | ICD-10-CM | POA: Insufficient documentation

## 2015-04-11 DIAGNOSIS — Z87891 Personal history of nicotine dependence: Secondary | ICD-10-CM | POA: Insufficient documentation

## 2015-04-11 DIAGNOSIS — N183 Chronic kidney disease, stage 3 (moderate): Secondary | ICD-10-CM

## 2015-04-24 ENCOUNTER — Ambulatory Visit (INDEPENDENT_AMBULATORY_CARE_PROVIDER_SITE_OTHER): Payer: Medicare Other | Admitting: Family Medicine

## 2015-04-24 ENCOUNTER — Other Ambulatory Visit: Payer: Self-pay | Admitting: Family Medicine

## 2015-04-24 ENCOUNTER — Encounter: Payer: Self-pay | Admitting: Family Medicine

## 2015-04-24 VITALS — BP 170/71 | HR 65 | Temp 98.3°F | Ht 60.0 in | Wt 161.0 lb

## 2015-04-24 DIAGNOSIS — R809 Proteinuria, unspecified: Secondary | ICD-10-CM | POA: Diagnosis not present

## 2015-04-24 DIAGNOSIS — Z5181 Encounter for therapeutic drug level monitoring: Secondary | ICD-10-CM | POA: Diagnosis not present

## 2015-04-24 DIAGNOSIS — I10 Essential (primary) hypertension: Secondary | ICD-10-CM | POA: Diagnosis not present

## 2015-04-24 DIAGNOSIS — E785 Hyperlipidemia, unspecified: Secondary | ICD-10-CM | POA: Diagnosis not present

## 2015-04-24 DIAGNOSIS — E559 Vitamin D deficiency, unspecified: Secondary | ICD-10-CM

## 2015-04-24 DIAGNOSIS — Z87891 Personal history of nicotine dependence: Secondary | ICD-10-CM

## 2015-04-24 DIAGNOSIS — R7301 Impaired fasting glucose: Secondary | ICD-10-CM

## 2015-04-24 MED ORDER — CLOPIDOGREL BISULFATE 75 MG PO TABS: 75.0000 mg | ORAL_TABLET | Freq: Every day | ORAL | Status: DC

## 2015-04-24 MED ORDER — SIMVASTATIN 40 MG PO TABS: 40.0000 mg | ORAL_TABLET | Freq: Every day | ORAL | Status: DC

## 2015-04-24 MED ORDER — MAGNESIUM 250 MG PO TABS: 1.0000 | ORAL_TABLET | Freq: Every day | ORAL | Status: DC

## 2015-04-24 MED ORDER — VITAMIN D3 50 MCG (2000 UT) PO CAPS: 2000.0000 [IU] | ORAL_CAPSULE | Freq: Every day | ORAL | Status: DC

## 2015-04-24 NOTE — Assessment & Plan Note (Signed)
So proud of her for quitting and remaining off of cigarettes for over a year now; she qualifies for screening low-dose chest CT yearly until 2030

## 2015-04-24 NOTE — Patient Instructions (Addendum)
We'll contact you about your labs and send you a letter I am so proud of you for stopping your cigarettes and staying away from smoke You should get a yearly chest CT to screen for lung cancer until 2030 Keep trying to eat healthy and stay active Start back on your clonidine that Dr. Ubaldo Glassing prescribed Please call and give Amy the dose and frequency so she can add that to your medicine list Try to follow the DASH guidelines to help lower your blood pressure naturally  DASH Eating Plan DASH stands for "Dietary Approaches to Stop Hypertension." The DASH eating plan is a healthy eating plan that has been shown to reduce high blood pressure (hypertension). Additional health benefits may include reducing the risk of type 2 diabetes mellitus, heart disease, and stroke. The DASH eating plan may also help with weight loss. WHAT DO I NEED TO KNOW ABOUT THE DASH EATING PLAN? For the DASH eating plan, you will follow these general guidelines:  Choose foods with a percent daily value for sodium of less than 5% (as listed on the food label).  Use salt-free seasonings or herbs instead of table salt or sea salt.  Check with your health care provider or pharmacist before using salt substitutes.  Eat lower-sodium products, often labeled as "lower sodium" or "no salt added."  Eat fresh foods.  Eat more vegetables, fruits, and low-fat dairy products.  Choose whole grains. Look for the word "whole" as the first word in the ingredient list.  Choose fish and skinless chicken or [place] more often than red meat. Limit fish, poultry, and meat to 6 oz (170 g) each day.  Limit sweets, desserts, sugars, and sugary drinks.  Choose heart-healthy fats.  Limit cheese to 1 oz (28 g) per day.  Eat more home-cooked food and less restaurant, buffet, and fast food.  Limit fried foods.  Cook foods using methods other than frying.  Limit canned vegetables. If you do use them, rinse them well to decrease the  sodium.  When eating at a restaurant, ask that your food be prepared with less salt, or no salt if possible. WHAT FOODS CAN I EAT? Seek help from a dietitian for individual calorie needs. Grains Whole grain or whole wheat bread. Brown rice. Whole grain or whole wheat pasta. Quinoa, bulgur, and whole grain cereals. Low-sodium cereals. Corn or whole wheat flour tortillas. Whole grain cornbread. Whole grain crackers. Low-sodium crackers. Vegetables Fresh or frozen vegetables (raw, steamed, roasted, or grilled). Low-sodium or reduced-sodium tomato and vegetable juices. Low-sodium or reduced-sodium tomato sauce and paste. Low-sodium or reduced-sodium canned vegetables.  Fruits All fresh, canned (in natural juice), or frozen fruits. Meat and Other Protein Products Ground beef (85% or leaner), grass-fed beef, or beef trimmed of fat. Skinless chicken or [place]. Ground chicken or [place]. Pork trimmed of fat. All fish and seafood. Eggs. Dried beans, peas, or lentils. Unsalted nuts and seeds. Unsalted canned beans. Dairy Low-fat dairy products, such as skim or 1% milk, 2% or reduced-fat cheeses, low-fat ricotta or cottage cheese, or plain low-fat yogurt. Low-sodium or reduced-sodium cheeses. Fats and Oils Tub margarines without trans fats. Light or reduced-fat mayonnaise and salad dressings (reduced sodium). Avocado. Safflower, olive, or canola oils. Natural peanut or almond butter. Other Unsalted popcorn and pretzels. The items listed above may not be a complete list of recommended foods or beverages. Contact your dietitian for more options. WHAT FOODS ARE NOT RECOMMENDED? Grains White bread. White pasta. White rice. Refined cornbread. Bagels and croissants.  Crackers that contain trans fat. Vegetables Creamed or fried vegetables. Vegetables in a cheese sauce. Regular canned vegetables. Regular canned tomato sauce and paste. Regular tomato and vegetable juices. Fruits Dried fruits. Canned fruit in  light or heavy syrup. Fruit juice. Meat and Other Protein Products Fatty cuts of meat. Ribs, chicken wings, bacon, sausage, bologna, salami, chitterlings, fatback, hot dogs, bratwurst, and packaged luncheon meats. Salted nuts and seeds. Canned beans with salt. Dairy Whole or 2% milk, cream, half-and-half, and cream cheese. Whole-fat or sweetened yogurt. Full-fat cheeses or blue cheese. Nondairy creamers and whipped toppings. Processed cheese, cheese spreads, or cheese curds. Condiments Onion and garlic salt, seasoned salt, table salt, and sea salt. Canned and packaged gravies. Worcestershire sauce. Tartar sauce. Barbecue sauce. Teriyaki sauce. Soy sauce, including reduced sodium. Steak sauce. Fish sauce. Oyster sauce. Cocktail sauce. Horseradish. Ketchup and mustard. Meat flavorings and tenderizers. Bouillon cubes. Hot sauce. Tabasco sauce. Marinades. Taco seasonings. Relishes. Fats and Oils Butter, stick margarine, lard, shortening, ghee, and bacon fat. Coconut, palm kernel, or palm oils. Regular salad dressings. Other Pickles and olives. Salted popcorn and pretzels. The items listed above may not be a complete list of foods and beverages to avoid. Contact your dietitian for more information. WHERE CAN I FIND MORE INFORMATION? National Heart, Lung, and Blood Institute: travelstabloid.com Document Released: 08/12/2011 Document Revised: 01/07/2014 Document Reviewed: 06/27/2013 [facility] Patient Information 2015 [place], [place]. This information is not intended to replace advice given to you by your health care provider. Make sure you discuss any questions you have with your health care provider.

## 2015-04-24 NOTE — Assessment & Plan Note (Signed)
Start back on clonidine; patient will let us know strength and frequency to update her chart; recheck in 3 weeks

## 2015-04-24 NOTE — Assessment & Plan Note (Signed)
Check urine today; if positive, has not tolerated ACE-I or ARB

## 2015-04-24 NOTE — Assessment & Plan Note (Signed)
Check lipids today and sgpt; goal LDL less than 100 at least and under 70 ideally; reduce saturated fats, no more than 3 eggs per week

## 2015-04-24 NOTE — Assessment & Plan Note (Addendum)
I wanted to check level today but Medicare red flag appeared; will have her continue to use 2,000 iu vit D3 daily

## 2015-04-24 NOTE — Progress Notes (Signed)
BP 170/71 mmHg  Pulse 65  Temp(Src) 98.3 F (36.8 C)  Ht 5' (1.524 m)  Wt 161 lb (73.029 kg)  BMI 31.44 kg/m2  SpO2 98%   Subjective:    Patient ID: Angela Duffy, female    DOB: 09-15-1934, 80 y.o.   MRN: [id number]  HPI: Angela Duffy is a 80 y.o. female  Chief Complaint  Patient presents with  . Hyperlipidemia  . Hypertension  . IFG   Patient is here for f/u of high cholesterol; still on cholesterol medicine; last labs reviewed from February; limiting fried chicken to once a month; on statin without side effects  She has high blood pressure; 201/75 at first recheck; recheck after she sat for several minutes was 170/71; she is going to go back on the clonidine she says; she has not been taking any blood pressure medicine since leaving the hospital, it was in the 130s and 140s; she had surgery with Dr. Ronalee Belts; her arteries were blocked and her stent was blocked; she saw him for f/u; systolic was 027 mmHg in May at his office; last note reviewed; Dr. Ubaldo Glassing gave her clonidine and she has plenty of that at home; she says she'll start back, but doesn't know the strength or the frequency  She has prediabetes; limiting sweets; last A1C in January was 6.1  Vitamin D deficiency on Jan 27, 2015 was 12.5  She has positive microalbumin; she cannot tolerate ACE-I or ARB due to side effects  She was her prescriptions extended longer  She does not want DEXA, colonoscopy, tetanus today; she says we can bring up again next visit she says  Relevant past medical, surgical, family and social history reviewed and updated as indicated. Interim medical history since our last visit reviewed. Allergies and medications reviewed and updated.  Review of Systems  Cardiovascular: Positive for leg swelling. Negative for chest pain.    Per HPI unless specifically indicated above     Objective:    BP 170/71 mmHg  Pulse 65  Temp(Src) 98.3 F (36.8 C)  Ht 5' (1.524 m)  Wt 161 lb  (73.029 kg)  BMI 31.44 kg/m2  SpO2 98%  Wt Readings from Last 3 Encounters:  04/24/15 161 lb (73.029 kg)  01/27/15 156 lb (70.761 kg)    Physical Exam  Constitutional: She appears well-developed and well-nourished. No distress.  HENT:  Head: Normocephalic and atraumatic.  Eyes: EOM are normal. No scleral icterus.  Neck: No thyromegaly present.  Cardiovascular: Normal rate, regular rhythm and normal heart sounds.   No murmur heard. Pulmonary/Chest: Effort normal and breath sounds normal. No respiratory distress. She has no wheezes.  Abdominal: Soft. Bowel sounds are normal. She exhibits no distension.  Musculoskeletal: Normal range of motion. She exhibits no edema.  Neurological: She is alert. She exhibits normal muscle tone.  Skin: Skin is warm and dry. She is not diaphoretic. No pallor.  Ecchymoses on the sun-exposed surfaces of the arms  Psychiatric: She has a normal mood and affect. Her behavior is normal. Judgment and thought content normal.  talkative    Results for orders placed or performed during the hospital encounter of 12/17/14  BUN  Result Value Ref Range   BUN 18 mg/dL  Creatinine, serum  Result Value Ref Range   Creatinine 0.86 mg/dL   EGFR (African American) >60    EGFR (Non-African Amer.) >60       Assessment & Plan:   Problem List Items Addressed This Visit  Cardiovascular and Mediastinum   Hypertension    Start back on clonidine; patient will let us know strength and frequency to update her chart; recheck in 3 weeks      Relevant Medications   simvastatin (ZOCOR) 40 MG tablet     Endocrine   IFG (impaired fasting glucose)    Watch diet, limit sweets and simple carbs like white bread, white rice and work on weight loss; check A1C today      Relevant Orders   Hgb A1c w/o eAG     Other   Hyperlipidemia - Primary    Check lipids today and sgpt; goal LDL less than 100 at least and under 70 ideally; reduce saturated fats, no more than 3 eggs  per week      Relevant Medications   simvastatin (ZOCOR) 40 MG tablet   Other Relevant Orders   Lipid Panel w/o Chol/HDL Ratio   History of tobacco use    So proud of her for quitting and remaining off of cigarettes for over a year now; she qualifies for screening low-dose chest CT yearly until 2030      Vitamin D deficiency disease    I wanted to check level today but Medicare red flag appeared; will have her continue to use 2,000 iu vit D3 daily      Microalbuminuria    Check urine today; if positive, has not tolerated ACE-I or ARB      Relevant Orders   Microalbumin / creatinine urine ratio    Other Visit Diagnoses    Medication monitoring encounter        Relevant Orders    CBC with Differential/Platelet    Comprehensive metabolic panel       Follow up plan: Return in about 3 weeks (around 05/15/2015) for for hypertension.  An after-visit summary was printed and given to the patient at [place].  Please see the patient instructions which may contain other information and recommendations beyond what is mentioned above in the assessment and plan.  Orders Placed This Encounter  Procedures  . CBC with Differential/Platelet  . Comprehensive metabolic panel  . Lipid Panel w/o Chol/HDL Ratio  . Hgb A1c w/o eAG  . Microalbumin / creatinine urine ratio   Meds ordered this encounter  Medications  . simvastatin (ZOCOR) 40 MG tablet    Sig: Take 1 tablet (40 mg total) by mouth at bedtime.    Dispense:  90 tablet    Refill:  3  . clopidogrel (PLAVIX) 75 MG tablet    Sig: Take 1 tablet (75 mg total) by mouth daily.    Dispense:  90 tablet    Refill:  3  . Magnesium 250 MG TABS    Sig: Take 1 tablet (250 mg total) by mouth daily.    Dispense:  90 tablet    Refill:  1  . Cholecalciferol (VITAMIN D3) 2000 UNITS capsule    Sig: Take 1 capsule (2,000 Units total) by mouth daily.

## 2015-04-24 NOTE — Assessment & Plan Note (Signed)
Watch diet, limit sweets and simple carbs like white bread, white rice and work on weight loss; check A1C today

## 2015-04-25 LAB — COMPREHENSIVE METABOLIC PANEL
ALT: 18 IU/L (ref 0–32)
AST: 25 IU/L (ref 0–40)
Albumin/Globulin Ratio: 2 (ref 1.1–2.5)
Albumin: 4.5 g/dL (ref 3.5–4.8)
Alkaline Phosphatase: 74 IU/L (ref 39–117)
BUN/Creatinine Ratio: 15 (ref 11–26)
BUN: 12 mg/dL (ref 8–27)
Bilirubin Total: 0.4 mg/dL (ref 0.0–1.2)
CALCIUM: 9.6 mg/dL (ref 8.7–10.3)
CO2: 28 mmol/L (ref 18–29)
Chloride: 101 mmol/L (ref 97–108)
Creatinine, Ser: 0.82 mg/dL (ref 0.57–1.00)
GFR, EST AFRICAN AMERICAN: 80 mL/min/{1.73_m2} (ref >59)
GFR, EST NON AFRICAN AMERICAN: 69 mL/min/{1.73_m2} (ref >59)
GLUCOSE: 91 mg/dL (ref 65–99)
Globulin, Total: 2.3 g/dL (ref 1.5–4.5)
Potassium: 4.2 mmol/L (ref 3.5–5.2)
Sodium: 145 mmol/L — ABNORMAL HIGH (ref 134–144)
Total Protein: 6.8 g/dL (ref 6.0–8.5)

## 2015-04-25 LAB — CBC WITH DIFFERENTIAL/PLATELET
Basophils Absolute: 0 10*3/uL (ref 0.0–0.2)
Basos: 1 %
EOS (ABSOLUTE): 0.1 10*3/uL (ref 0.0–0.4)
Eos: 1 %
Hematocrit: 37.5 % (ref 34.0–46.6)
Hemoglobin: 12.4 g/dL (ref 11.1–15.9)
IMMATURE GRANULOCYTES: 0 %
Immature Grans (Abs): 0 10*3/uL (ref 0.0–0.1)
LYMPHS: 18 %
Lymphocytes Absolute: 0.9 10*3/uL (ref 0.7–3.1)
MCH: 27.8 pg (ref 26.6–33.0)
MCHC: 33.1 g/dL (ref 31.5–35.7)
MCV: 84 fL (ref 79–97)
Monocytes Absolute: 0.5 10*3/uL (ref 0.1–0.9)
Monocytes: 11 %
NEUTROS PCT: 69 %
Neutrophils Absolute: 3.5 10*3/uL (ref 1.4–7.0)
PLATELETS: 208 10*3/uL (ref 150–379)
RBC: 4.46 x10E6/uL (ref 3.77–5.28)
RDW: 15.9 % — ABNORMAL HIGH (ref 12.3–15.4)
WBC: 5.1 10*3/uL (ref 3.4–10.8)

## 2015-04-25 LAB — HGB A1C W/O EAG: Hgb A1c MFr Bld: 5.8 % — ABNORMAL HIGH (ref 4.8–5.6)

## 2015-04-25 LAB — MICROALBUMIN / CREATININE URINE RATIO

## 2015-04-25 LAB — LIPID PANEL W/O CHOL/HDL RATIO
Cholesterol, Total: 196 mg/dL (ref 100–199)
HDL: 60 mg/dL (ref >39)
LDL Calculated: 114 mg/dL — ABNORMAL HIGH (ref 0–99)
Triglycerides: 112 mg/dL (ref 0–149)
VLDL Cholesterol Cal: 22 mg/dL (ref 5–40)

## 2015-04-26 LAB — MICROALBUMIN / CREATININE URINE RATIO
Creatinine, Urine: 101.2 mg/dL
MICROALB/CREAT RATIO: 392.9 mg/g{creat} — AB (ref 0.0–30.0)
Microalbumin, Urine: 397.6 ug/mL

## 2015-04-30 ENCOUNTER — Telehealth: Payer: Self-pay | Admitting: Family Medicine

## 2015-05-13 MED ORDER — SIMVASTATIN 40 MG PO TABS: 40.0000 mg | ORAL_TABLET | Freq: Every day | ORAL | Status: DC

## 2015-05-13 NOTE — Telephone Encounter (Signed)
She had missed some days of the statin, now back on it She does eat cheese; limit saturated fats She is trying Discussed goal LDL; if she can bring it down, that's great She has not taken pneumonia vaccines at our office She took the first pneumonia shot at [facility] at age 80 Amy brought it in there to her and left it on the shelf and was coming back and she read about it and decided to not get it She has not had the PCV-13 at all; she wants it out of the chart Limit animal meat; avoid NSAIDs; discussed microalbuminuria ----------------------- AMY -- please REMOVE the PCV-13 vaccine from her history/immunizations, as she says she never got it Change the date of the PPSV-23 (Pneumovax) to age 51 (okay to put down as having it had on her 67th birthday), which is around when she got it, not given here she says Thank you

## 2015-05-14 ENCOUNTER — Ambulatory Visit: Payer: Medicare Other | Admitting: Family Medicine

## 2015-05-14 NOTE — Telephone Encounter (Signed)
Immunizations updated/corrected.

## 2015-05-21 ENCOUNTER — Encounter: Payer: Self-pay | Admitting: Family Medicine

## 2015-05-21 ENCOUNTER — Ambulatory Visit (INDEPENDENT_AMBULATORY_CARE_PROVIDER_SITE_OTHER): Payer: Medicare Other | Admitting: Family Medicine

## 2015-05-21 VITALS — BP 182/70 | HR 59 | Temp 98.4°F | Ht 60.1 in | Wt 158.0 lb

## 2015-05-21 DIAGNOSIS — I10 Essential (primary) hypertension: Secondary | ICD-10-CM

## 2015-05-21 DIAGNOSIS — I519 Heart disease, unspecified: Secondary | ICD-10-CM | POA: Diagnosis not present

## 2015-05-21 DIAGNOSIS — I517 Cardiomegaly: Secondary | ICD-10-CM

## 2015-05-21 DIAGNOSIS — E785 Hyperlipidemia, unspecified: Secondary | ICD-10-CM

## 2015-05-21 MED ORDER — TRIAMTERENE-HCTZ 37.5-25 MG PO TABS: 1.0000 | ORAL_TABLET | Freq: Every day | ORAL | Status: DC

## 2015-05-21 MED ORDER — SIMVASTATIN 40 MG PO TABS: 40.0000 mg | ORAL_TABLET | Freq: Every day | ORAL | Status: DC

## 2015-05-21 NOTE — Progress Notes (Signed)
BP 182/70 mmHg  Pulse 59  Temp(Src) 98.4 F (36.9 C)  Ht 5' 0.1" (1.527 m)  Wt 158 lb (71.668 kg)  BMI 30.74 kg/m2  SpO2 97%   Subjective:    Patient ID: Angela Duffy, female    DOB: 1935/04/29, 80 y.o.   MRN: [id number]  HPI: Angela Duffy is a 80 y.o. female  Chief Complaint  Patient presents with  . Hypertension  . Joint Swelling    ankles, started Sunday   She says the clonidine makes her feel terrible; she had to weed it out; she took one and then waited and then did a research thing; she has gained weight; "my boobs have gotten bigger" she complains; she has gotten short of breath; mouth is dry, eyes water; she bought a pill book a the drug store and she has 8 out of the side effects; she took one this morning, she took just one yesterday; she will plan to take one daily for the next 3 days and then stop per the druggist's recommendation; the druggist gave her these instructions; she wants to go back on triamterene She had enough swelling in her legs that on Saturday, she took a triamterene; she wanted to make sure it is safe to take; she used to take triamterene but stopped it when BP came back down to normal; now it's back up; she did not have any bad side effects Since getting the fluid off with the fluid pill, she says the fluid is coming off and her breathing is better; a little short-winded when doing other things; over 2-3 weeks; last echo was about 7-9 months ago; Aug 21, 2014 echocardiogram 60-65% LVEF (we reviewed it in her chart together) She can get a deep breath and hold it for a long time She ate a bag of potato chips on Saturday  High cholesterol; limits eggs; found something to cook eggs in microwave without the butter and grease; occasional omelets; needs refill of statin; mostly has oatmeal and raisins for breakfast; no RUQ pain or muscle aches on the statin  Relevant past medical, surgical, family and social history reviewed and updated as  indicated. Interim medical history since our last visit reviewed. Allergies and medications reviewed and updated. Review of Systems  Constitutional: Positive for unexpected weight change.  HENT: Negative for nosebleeds.   Respiratory: Positive for shortness of breath.   Cardiovascular: Positive for leg swelling. Negative for chest pain and palpitations.  Gastrointestinal: Negative for blood in stool.  Genitourinary: Negative for hematuria.  Skin:       Clearing brush in the yard, scraps and bruises on arms  Neurological: Negative for tremors.       Brief zip in the arm occasionally right arm  Hematological: Bruises/bleeds easily (on arms from plavix).  Per HPI unless specifically indicated above     Objective:    BP 182/70 mmHg  Pulse 59  Temp(Src) 98.4 F (36.9 C)  Ht 5' 0.1" (1.527 m)  Wt 158 lb (71.668 kg)  BMI 30.74 kg/m2  SpO2 97%  Wt Readings from Last 3 Encounters:  05/21/15 158 lb (71.668 kg)  04/24/15 161 lb (73.029 kg)  01/27/15 156 lb (70.761 kg)    Physical Exam  Constitutional: She appears well-developed and well-nourished. No distress.  HENT:  Head: Normocephalic and atraumatic.  Eyes: EOM are normal. No scleral icterus.  Neck: No JVD present. No thyromegaly present.  Cardiovascular: Normal rate, regular rhythm and normal heart sounds.   No  murmur heard. Pulmonary/Chest: Effort normal and breath sounds normal. No respiratory distress. She has no wheezes. She has no rales.  Abdominal: Soft. Bowel sounds are normal. She exhibits no distension.  Musculoskeletal: Normal range of motion. She exhibits edema (trace edema both ankles, lower extremities; skin is slightly wrinkled suggesting recent swelling which is resolving).  Neurological: She is alert. She exhibits normal muscle tone.  Skin: Skin is warm and dry. She is not diaphoretic. No pallor.  Psychiatric: She has a normal mood and affect. Her behavior is normal. Judgment and thought content normal.    Results for orders placed or performed in visit on 04/24/15  CBC with Differential/Platelet  Result Value Ref Range   WBC 5.1 3.4 - 10.8 x10E3/uL   RBC 4.46 3.77 - 5.28 x10E6/uL   Hemoglobin 12.4 11.1 - 15.9 g/dL   Hematocrit 37.5 34.0 - 46.6 %   MCV 84 79 - 97 fL   MCH 27.8 26.6 - 33.0 pg   MCHC 33.1 31.5 - 35.7 g/dL   RDW 15.9 (H) 12.3 - 15.4 %   Platelets 208 150 - 379 x10E3/uL   Neutrophils 69 %   Lymphs 18 %   Monocytes 11 %   Eos 1 %   Basos 1 %   Neutrophils Absolute 3.5 1.4 - 7.0 x10E3/uL   Lymphocytes Absolute 0.9 0.7 - 3.1 x10E3/uL   Monocytes Absolute 0.5 0.1 - 0.9 x10E3/uL   EOS (ABSOLUTE) 0.1 0.0 - 0.4 x10E3/uL   Basophils Absolute 0.0 0.0 - 0.2 x10E3/uL   Immature Granulocytes 0 %   Immature Grans (Abs) 0.0 0.0 - 0.1 x10E3/uL  Comprehensive metabolic panel  Result Value Ref Range   Glucose 91 65 - 99 mg/dL   BUN 12 8 - 27 mg/dL   Creatinine, Ser 0.82 0.57 - 1.00 mg/dL   GFR calc non Af Amer 69 >59 mL/min/1.73   GFR calc Af Amer 80 >59 mL/min/1.73   BUN/Creatinine Ratio 15 11 - 26   Sodium 145 (H) 134 - 144 mmol/L   Potassium 4.2 3.5 - 5.2 mmol/L   Chloride 101 97 - 108 mmol/L   CO2 28 18 - 29 mmol/L   Calcium 9.6 8.7 - 10.3 mg/dL   Total Protein 6.8 6.0 - 8.5 g/dL   Albumin 4.5 3.5 - 4.8 g/dL   Globulin, Total 2.3 1.5 - 4.5 g/dL   Albumin/Globulin Ratio 2.0 1.1 - 2.5   Bilirubin Total 0.4 0.0 - 1.2 mg/dL   Alkaline Phosphatase 74 39 - 117 IU/L   AST 25 0 - 40 IU/L   ALT 18 0 - 32 IU/L  Lipid Panel w/o Chol/HDL Ratio  Result Value Ref Range   Cholesterol, Total 196 100 - 199 mg/dL   Triglycerides 112 0 - 149 mg/dL   HDL 60 >39 mg/dL   VLDL Cholesterol Cal 22 5 - 40 mg/dL   LDL Calculated 114 (H) 0 - 99 mg/dL  Hgb A1c w/o eAG  Result Value Ref Range   Hgb A1c MFr Bld 5.8 (H) 4.8 - 5.6 %  Microalbumin / creatinine urine ratio  Result Value Ref Range   Creatinine, Urine CANCELED mg/dL   Microalbum.,U,Random CANCELED       Assessment & Plan:    Problem List Items Addressed This Visit      Cardiovascular and Mediastinum   Hypertension    She wishes to stop the clonidine and use just the triamterene/hctz; DASH guidelines encouraged; she will monitor and notify me if not to  goal before her follow-up appt; return in one month      Relevant Medications   triamterene-hydrochlorothiazide (MAXZIDE-25) 37.5-25 MG per tablet   simvastatin (ZOCOR) 40 MG tablet   Left ventricular diastolic dysfunction - Primary    LVEF 60% on last echo; heart rate 59 so additional beta-blocker would not be ideal, plus she reports allergic reaction to metoprolol; limit salt, work on weight loss; sodium limitation      Relevant Medications   triamterene-hydrochlorothiazide (MAXZIDE-25) 37.5-25 MG per tablet   simvastatin (ZOCOR) 40 MG tablet   Left ventricular hypertrophy    Control BP and weight      Relevant Medications   triamterene-hydrochlorothiazide (MAXZIDE-25) 37.5-25 MG per tablet   simvastatin (ZOCOR) 40 MG tablet     Other   Hyperlipidemia    Discussed dietary recommendations; LDL not to goal, but she does not want to increase med; she will try dietary modifications; continue statin, refills provided      Relevant Medications   triamterene-hydrochlorothiazide (MAXZIDE-25) 37.5-25 MG per tablet   simvastatin (ZOCOR) 40 MG tablet       Follow up plan: Return in about 4 weeks (around 06/18/2015) for blood pressure.  Meds ordered this encounter  Medications  . Cyanocobalamin (VITAMIN B-12) 1000 MCG SUBL    Sig: Place under the tongue daily.   Marland Kitchen DISCONTD: cloNIDine (CATAPRES) 0.1 MG tablet    Sig: Take 0.1 mg by mouth 2 (two) times daily.  Marland Kitchen triamterene-hydrochlorothiazide (MAXZIDE-25) 37.5-25 MG per tablet    Sig: Take 1 tablet by mouth daily.    Dispense:  30 tablet    Refill:  3  . simvastatin (ZOCOR) 40 MG tablet    Sig: Take 1 tablet (40 mg total) by mouth at bedtime.    Dispense:  30 tablet    Refill:  11   An  after-visit summary was printed and given to the patient at [place].  Please see the patient instructions which may contain other information and recommendations beyond what is mentioned above in the assessment and plan.

## 2015-05-21 NOTE — Patient Instructions (Signed)
Go back on the medicine from Dr. Ubaldo Glassing Stop the clonidine over the next couple of days Really try to limit salt / sodium Try the DASH guidelines Let me know if your swelling worsens or the shortness of breath does not go away Let Dr. Ubaldo Glassing know about your symptoms too please Our goal blood pressure is less than 150 on top; check blood pressure at home and let me or Dr. Ubaldo Glassing know if you are not to goal Limit egg yolks to three per week

## 2015-05-25 NOTE — Assessment & Plan Note (Signed)
LVEF 60% on last echo; heart rate 59 so additional beta-blocker would not be ideal, plus she reports allergic reaction to metoprolol; limit salt, work on weight loss; sodium limitation

## 2015-05-25 NOTE — Assessment & Plan Note (Signed)
Discussed dietary recommendations; LDL not to goal, but she does not want to increase med; she will try dietary modifications; continue statin, refills provided

## 2015-05-25 NOTE — Assessment & Plan Note (Signed)
Control BP and weight

## 2015-05-25 NOTE — Assessment & Plan Note (Addendum)
She wishes to stop the clonidine and use just the triamterene/hctz; DASH guidelines encouraged; she will monitor and notify me if not to goal before her follow-up appt; return in one month

## 2015-06-12 DIAGNOSIS — Z23 Encounter for immunization: Secondary | ICD-10-CM | POA: Diagnosis not present

## 2015-06-18 ENCOUNTER — Encounter: Payer: Self-pay | Admitting: Family Medicine

## 2015-06-18 ENCOUNTER — Ambulatory Visit (INDEPENDENT_AMBULATORY_CARE_PROVIDER_SITE_OTHER): Payer: Medicare Other | Admitting: Family Medicine

## 2015-06-18 VITALS — BP 196/78 | HR 75 | Temp 97.6°F | Ht 60.0 in | Wt 156.0 lb

## 2015-06-18 DIAGNOSIS — I10 Essential (primary) hypertension: Secondary | ICD-10-CM

## 2015-06-18 NOTE — Progress Notes (Signed)
BP 196/78 mmHg  Pulse 75  Temp(Src) 97.6 F (36.4 C)  Ht 5' (1.524 m)  Wt 156 lb (70.761 kg)  BMI 30.47 kg/m2  SpO2 98%   Subjective:    Patient ID: Angela Duffy, female    DOB: 1934/11/22, 80 y.o.   MRN: [id number]  HPI: Angela Duffy is a 80 y.o. female  Chief Complaint  Patient presents with  . Hypertension    She states she read in a book and wonders if the cholesterol med could be causing it to stay up   She does not think her blood pressure will ever be right; she has been taking medicine for years; it came down right after surgery, but she was in the bed a lot and her blood pressure got low; for the first six weeks, her BP was controlled; she also suspects that her cuff might not be working; when she saw Dr. Ronalee Belts, her systolic was 323 No headache and no chest pain; her eyes are getting weak, saw her eye doctor and has appt to consider getting cataracts taken out The eye surgeon had to cancel her surgery because something came up for the doctor She has family [place] which [place] just hit by a hurricane, but she is trying to not stress over it She has been on so many medicines for high blood pressure She has lost some weight; got off of the clonidine and back on triamterene; lost down from 168; hard to lose weight when you can't exercise like you want The salt is what she has to work on, she admits; she realizes that salt is hidden in everything, she eats some snacks she knows she shouldn't eat She is aware of risk of stroke; she had an event in the 1980s, trouble getting words out for a few months after fainting at work; didn't miss the step, just like a faint; they called rescue unit She does not want her medicines adjusted today  Relevant past medical, surgical, family and social history reviewed and updated as indicated. Interim medical history since our last visit reviewed. Allergies and medications reviewed and updated.  Review of  Systems  Per HPI unless specifically indicated above     Objective:    BP 196/78 mmHg  Pulse 75  Temp(Src) 97.6 F (36.4 C)  Ht 5' (1.524 m)  Wt 156 lb (70.761 kg)  BMI 30.47 kg/m2  SpO2 98%  Wt Readings from Last 3 Encounters:  06/18/15 156 lb (70.761 kg)  05/21/15 158 lb (71.668 kg)  04/24/15 161 lb (73.029 kg)    Physical Exam  Constitutional: She appears well-developed and well-nourished.  HENT:  Mouth/Throat: Mucous membranes are normal.  Eyes: EOM are normal. No scleral icterus.  Cardiovascular: Normal rate and regular rhythm.   Pulmonary/Chest: Effort normal and breath sounds normal.  Musculoskeletal: She exhibits no edema.  Psychiatric: She has a normal mood and affect. Her behavior is normal.   Results for orders placed or performed in visit on 04/24/15  CBC with Differential/Platelet  Result Value Ref Range   WBC 5.1 3.4 - 10.8 x10E3/uL   RBC 4.46 3.77 - 5.28 x10E6/uL   Hemoglobin 12.4 11.1 - 15.9 g/dL   Hematocrit 37.5 34.0 - 46.6 %   MCV 84 79 - 97 fL   MCH 27.8 26.6 - 33.0 pg   MCHC 33.1 31.5 - 35.7 g/dL   RDW 15.9 (H) 12.3 - 15.4 %   Platelets 208 150 - 379  x10E3/uL   Neutrophils 69 %   Lymphs 18 %   Monocytes 11 %   Eos 1 %   Basos 1 %   Neutrophils Absolute 3.5 1.4 - 7.0 x10E3/uL   Lymphocytes Absolute 0.9 0.7 - 3.1 x10E3/uL   Monocytes Absolute 0.5 0.1 - 0.9 x10E3/uL   EOS (ABSOLUTE) 0.1 0.0 - 0.4 x10E3/uL   Basophils Absolute 0.0 0.0 - 0.2 x10E3/uL   Immature Granulocytes 0 %   Immature Grans (Abs) 0.0 0.0 - 0.1 x10E3/uL  Comprehensive metabolic panel  Result Value Ref Range   Glucose 91 65 - 99 mg/dL   BUN 12 8 - 27 mg/dL   Creatinine, Ser 0.82 0.57 - 1.00 mg/dL   GFR calc non Af Amer 69 >59 mL/min/1.73   GFR calc Af Amer 80 >59 mL/min/1.73   BUN/Creatinine Ratio 15 11 - 26   Sodium 145 (H) 134 - 144 mmol/L   Potassium 4.2 3.5 - 5.2 mmol/L   Chloride 101 97 - 108 mmol/L   CO2 28 18 - 29 mmol/L   Calcium 9.6 8.7 - 10.3 mg/dL   Total  Protein 6.8 6.0 - 8.5 g/dL   Albumin 4.5 3.5 - 4.8 g/dL   Globulin, Total 2.3 1.5 - 4.5 g/dL   Albumin/Globulin Ratio 2.0 1.1 - 2.5   Bilirubin Total 0.4 0.0 - 1.2 mg/dL   Alkaline Phosphatase 74 39 - 117 IU/L   AST 25 0 - 40 IU/L   ALT 18 0 - 32 IU/L  Lipid Panel w/o Chol/HDL Ratio  Result Value Ref Range   Cholesterol, Total 196 100 - 199 mg/dL   Triglycerides 112 0 - 149 mg/dL   HDL 60 >39 mg/dL   VLDL Cholesterol Cal 22 5 - 40 mg/dL   LDL Calculated 114 (H) 0 - 99 mg/dL  Hgb A1c w/o eAG  Result Value Ref Range   Hgb A1c MFr Bld 5.8 (H) 4.8 - 5.6 %  Microalbumin / creatinine urine ratio  Result Value Ref Range   Creatinine, Urine CANCELED mg/dL   Microalbum.,U,Random CANCELED       Assessment & Plan:   Problem List Items Addressed This Visit      Cardiovascular and Mediastinum   Hypertension - Primary    Unfortunately, patient does not want to adjust medicine or do anything about her high blood pressure today; discussed risk of stroke, discussed s/s of stroke, reasons to call 911; advised other things she can do besides medications (avoidance of decongestants, NSAIDs, etc.); she will monitor her BP; I encouraged her to call me if she changes her mind about adjusting her blood pressure medicine          Follow up plan: Return in about 4 weeks (around 07/16/2015) for recheck blood pressure, bring your cuff with you.  An after-visit summary was printed and given to the patient at [place].  Please see the patient instructions which may contain other information and recommendations beyond what is mentioned above in the assessment and plan.

## 2015-06-18 NOTE — Patient Instructions (Addendum)
Avoid black licorice and black jelly beans Try to use PLAIN allergy or cold medicine without the decongestant Avoid: phenylephrine, phenylpropanolamine, and pseudoephredine If you need something for aches or pains, try to use Tylenol (acetaminphen) instead of non-steroidals (which include Aleve, ibuprofen, Advil, Motrin, and naproxen); non-steroidals can cause long-term kidney damage Avoid sodium/salt as much as possible and limit total daily intake to no more than 2000 mg per day (1500 mg is an even better target) If you change your mind about seeing a hypertension specialist, please let me know (I do recommend that you see them to get your pressure controlled) If you ever have signs/symptoms of stroke, call 911

## 2015-06-23 NOTE — Assessment & Plan Note (Signed)
Unfortunately, patient does not want to adjust medicine or do anything about her high blood pressure today; discussed risk of stroke, discussed s/s of stroke, reasons to call 911; advised other things she can do besides medications (avoidance of decongestants, NSAIDs, etc.); she will monitor her BP; I encouraged her to call me if she changes her mind about adjusting her blood pressure medicine

## 2015-07-21 ENCOUNTER — Ambulatory Visit (INDEPENDENT_AMBULATORY_CARE_PROVIDER_SITE_OTHER): Payer: Medicare Other | Admitting: Family Medicine

## 2015-07-21 ENCOUNTER — Encounter: Payer: Self-pay | Admitting: Family Medicine

## 2015-07-21 VITALS — BP 194/76 | HR 79 | Temp 97.7°F | Wt 153.0 lb

## 2015-07-21 DIAGNOSIS — I10 Essential (primary) hypertension: Secondary | ICD-10-CM | POA: Diagnosis not present

## 2015-07-21 DIAGNOSIS — M7552 Bursitis of left shoulder: Secondary | ICD-10-CM

## 2015-07-21 DIAGNOSIS — E785 Hyperlipidemia, unspecified: Secondary | ICD-10-CM | POA: Diagnosis not present

## 2015-07-21 DIAGNOSIS — E663 Overweight: Secondary | ICD-10-CM | POA: Diagnosis not present

## 2015-07-21 NOTE — Progress Notes (Signed)
BP 194/76 mmHg  Pulse 79  Temp(Src) 97.7 F (36.5 C)  Wt 153 lb (69.4 kg)  SpO2 96%   Subjective:    Patient ID: Angela Duffy, female    DOB: [birth year], 80 y.o.   MRN: [id number]  HPI: Angela Duffy is a 80 y.o. female  Chief Complaint  Patient presents with  . Hypertension    recheck, on her machine it was 184/83. She has really watched her diet and no salt.   She is working on losing weight on her own; really watching her salt She is checking her blood pressures at home Over the last week, her BPs vary, depending on what she has been doing She had another machine that her son bought her but the cuff was too big; she did not check her BP at home over the last week in all 140-170 at home most of the time, varies a lot; time of day, temperature, exertion, etc. She knows that she has partially clogged arteries when they couldn't get through the groin She wants to take the triamterene She still has clonidine and we looked back through the chart and found that it made her feel terrible; she does not want anything that will confuse her; the clonidine was prescribed by her cardiologist The right renal artery is where she has the stent, and the left side was about 60% open; the right was 80% closed; she sees Dr. Ronalee Belts in January, but her follow-up was Duffy; her BP was 140 at the last visit; I talked with her about seeing him sooner and having her get the left rescanned; he was not worried about the diastolic if in the [id number] She has seen Dr. Ubaldo Glassing (cardiologist), and she had the stress tests; no blockages in her heart arteries she says She has bursitis in her left shoulder and "it's killing me"; has to try to raise her left arm with her right arm; she has a name of a doctor at [facility] and she'll call She is going to have cataracts removed She is getting off of the hot dogs and red meat  Relevant past medical, surgical, family and social history reviewed and updated as  indicated. Interim medical history since our last visit reviewed. Allergies and medications reviewed and updated.  Review of Systems Per HPI unless specifically indicated above     Objective:    BP 194/76 mmHg  Pulse 79  Temp(Src) 97.7 F (36.5 C)  Wt 153 lb (69.4 kg)  SpO2 96%  Wt Readings from Last 3 Encounters:  07/21/15 153 lb (69.4 kg)  06/18/15 156 lb (70.761 kg)  05/21/15 158 lb (71.668 kg)   body mass index is 29.88 kg/(m^2).  Physical Exam  Constitutional: She appears well-developed and well-nourished.  HENT:  Mouth/Throat: Mucous membranes are normal.  Eyes: EOM are normal. No scleral icterus.  Cardiovascular: Normal rate and regular rhythm.   Pulmonary/Chest: Effort normal and breath sounds normal.  Abdominal: Soft. Bowel sounds are normal. She exhibits no abdominal bruit.  Musculoskeletal: She exhibits no edema.       Left shoulder: She exhibits decreased range of motion. She exhibits no swelling and no deformity.  Neurological: She is alert.  Skin: Skin is warm and dry.  Psychiatric: She has a normal mood and affect. Her behavior is normal.   Results for orders placed or performed in visit on 04/24/15  CBC with Differential/Platelet  Result Value Ref Range   WBC 5.1 3.4 - 10.8 x10E3/uL  RBC 4.46 3.77 - 5.28 x10E6/uL   Hemoglobin 12.4 11.1 - 15.9 g/dL   Hematocrit 37.5 34.0 - 46.6 %   MCV 84 79 - 97 fL   MCH 27.8 26.6 - 33.0 pg   MCHC 33.1 31.5 - 35.7 g/dL   RDW 15.9 (H) 12.3 - 15.4 %   Platelets 208 150 - 379 x10E3/uL   Neutrophils 69 %   Lymphs 18 %   Monocytes 11 %   Eos 1 %   Basos 1 %   Neutrophils Absolute 3.5 1.4 - 7.0 x10E3/uL   Lymphocytes Absolute 0.9 0.7 - 3.1 x10E3/uL   Monocytes Absolute 0.5 0.1 - 0.9 x10E3/uL   EOS (ABSOLUTE) 0.1 0.0 - 0.4 x10E3/uL   Basophils Absolute 0.0 0.0 - 0.2 x10E3/uL   Immature Granulocytes 0 %   Immature Grans (Abs) 0.0 0.0 - 0.1 x10E3/uL  Comprehensive metabolic panel  Result Value Ref Range   Glucose  91 65 - 99 mg/dL   BUN 12 8 - 27 mg/dL   Creatinine, Ser 0.82 0.57 - 1.00 mg/dL   GFR calc non Af Amer 69 >59 mL/min/1.73   GFR calc Af Amer 80 >59 mL/min/1.73   BUN/Creatinine Ratio 15 11 - 26   Sodium 145 (H) 134 - 144 mmol/L   Potassium 4.2 3.5 - 5.2 mmol/L   Chloride 101 97 - 108 mmol/L   CO2 28 18 - 29 mmol/L   Calcium 9.6 8.7 - 10.3 mg/dL   Total Protein 6.8 6.0 - 8.5 g/dL   Albumin 4.5 3.5 - 4.8 g/dL   Globulin, Total 2.3 1.5 - 4.5 g/dL   Albumin/Globulin Ratio 2.0 1.1 - 2.5   Bilirubin Total 0.4 0.0 - 1.2 mg/dL   Alkaline Phosphatase 74 39 - 117 IU/L   AST 25 0 - 40 IU/L   ALT 18 0 - 32 IU/L  Lipid Panel w/o Chol/HDL Ratio  Result Value Ref Range   Cholesterol, Total 196 100 - 199 mg/dL   Triglycerides 112 0 - 149 mg/dL   HDL 60 >39 mg/dL   VLDL Cholesterol Cal 22 5 - 40 mg/dL   LDL Calculated 114 (H) 0 - 99 mg/dL  Hgb A1c w/o eAG  Result Value Ref Range   Hgb A1c MFr Bld 5.8 (H) 4.8 - 5.6 %  Microalbumin / creatinine urine ratio  Result Value Ref Range   Creatinine, Urine CANCELED mg/dL   Microalbum.,U,Random CANCELED       Assessment & Plan:   Problem List Items Addressed This Visit      Cardiovascular and Mediastinum   Hypertension - Primary    I put phone calls in to her cardiologist and her vascular doctor; I am concerned that her other renal artery may be contributing to her rising pressures; we will try to get her vascular doctor to see her and I also left word for her cardiologist who prescribed the clonidine, left word about her pressures today and asked for him to contact me (he was doing a procedure at the hospital, I was told); patient will continue to monitor, do DASH guidelines; patient to call her cardiologist and/or renal vascular doctor with questions about their med changes or recommendations        Musculoskeletal and Integument   Bursitis of left shoulder    Patient will see orthopaedist        Other   Hyperlipidemia    So glad to hear  that she is reducing/eliminating hot dogs, red meat; she  is doing a Duffy job with weight loss, as well      Overweight (BMI 25.0-29.9)    Praised patient, weight category is now overweight instead of obese         I personally made phone calls to the vascular doctor and the cardiologist today  Follow up plan: Return if symptoms worsen or fail to improve.  An after-visit summary was printed and given to the patient at [place].  Please see the patient instructions which may contain other information and recommendations beyond what is mentioned above in the assessment and plan.

## 2015-07-21 NOTE — Patient Instructions (Signed)
We'll see what Dr. Ubaldo Glassing and Dr. Ronalee Belts say about your medicines and follow-up Keep up the great job with your DASH guidelines

## 2015-07-25 ENCOUNTER — Telehealth: Payer: Self-pay | Admitting: Family Medicine

## 2015-07-25 NOTE — Telephone Encounter (Signed)
I have not heard back from the cardiologist Please have her go back on the clonidine and call her cardiologist directly

## 2015-07-25 NOTE — Telephone Encounter (Signed)
She remembered why she had stopped it, she had a bunch of swelling especially in her breasts and she does not want to start it back. I advised her to call her cardiologist on Monday, she understood.

## 2015-07-26 DIAGNOSIS — M7552 Bursitis of left shoulder: Secondary | ICD-10-CM | POA: Insufficient documentation

## 2015-07-26 NOTE — Assessment & Plan Note (Signed)
So glad to hear that she is reducing/eliminating hot dogs, red meat; she is doing a good job with weight loss, as well

## 2015-07-26 NOTE — Assessment & Plan Note (Signed)
Praised patient, weight category is now overweight instead of obese

## 2015-07-26 NOTE — Assessment & Plan Note (Signed)
Patient will see orthopaedist

## 2015-07-26 NOTE — Assessment & Plan Note (Signed)
I put phone calls in to her cardiologist and her vascular doctor; I am concerned that her other renal artery may be contributing to her rising pressures; we will try to get her vascular doctor to see her and I also left word for her cardiologist who prescribed the clonidine, left word about her pressures today and asked for him to contact me (he was doing a procedure at the hospital, I was told); patient will continue to monitor, do DASH guidelines; patient to call her cardiologist and/or renal vascular doctor with questions about their med changes or recommendations

## 2015-08-22 ENCOUNTER — Ambulatory Visit (INDEPENDENT_AMBULATORY_CARE_PROVIDER_SITE_OTHER): Payer: Medicare Other | Admitting: Family Medicine

## 2015-08-22 DIAGNOSIS — Z5321 Procedure and treatment not carried out due to patient leaving prior to being seen by health care provider: Secondary | ICD-10-CM

## 2015-09-06 NOTE — Progress Notes (Signed)
**  Patient was not seen by the MD**

## 2015-09-10 ENCOUNTER — Ambulatory Visit: Payer: Medicare Other | Admitting: Family Medicine

## 2015-09-17 ENCOUNTER — Ambulatory Visit: Payer: Self-pay | Admitting: Family Medicine

## 2015-09-25 ENCOUNTER — Encounter: Payer: Self-pay | Admitting: Family Medicine

## 2015-09-25 ENCOUNTER — Ambulatory Visit (INDEPENDENT_AMBULATORY_CARE_PROVIDER_SITE_OTHER): Payer: Medicare Other | Admitting: Family Medicine

## 2015-09-25 VITALS — BP 149/81 | HR 81 | Temp 98.8°F | Ht 60.2 in | Wt 157.6 lb

## 2015-09-25 DIAGNOSIS — R7301 Impaired fasting glucose: Secondary | ICD-10-CM | POA: Diagnosis not present

## 2015-09-25 DIAGNOSIS — Z5181 Encounter for therapeutic drug level monitoring: Secondary | ICD-10-CM

## 2015-09-25 DIAGNOSIS — I7090 Unspecified atherosclerosis: Secondary | ICD-10-CM

## 2015-09-25 DIAGNOSIS — M7552 Bursitis of left shoulder: Secondary | ICD-10-CM | POA: Diagnosis not present

## 2015-09-25 DIAGNOSIS — I10 Essential (primary) hypertension: Secondary | ICD-10-CM | POA: Diagnosis not present

## 2015-09-25 DIAGNOSIS — N183 Chronic kidney disease, stage 3 unspecified: Secondary | ICD-10-CM

## 2015-09-25 DIAGNOSIS — R809 Proteinuria, unspecified: Secondary | ICD-10-CM | POA: Diagnosis not present

## 2015-09-25 DIAGNOSIS — I350 Nonrheumatic aortic (valve) stenosis: Secondary | ICD-10-CM | POA: Diagnosis not present

## 2015-09-25 LAB — MICROALBUMIN, URINE WAIVED
CREATININE, URINE WAIVED: 50 mg/dL (ref 10–300)
MICROALB, UR WAIVED: 150 mg/L — AB (ref 0–19)

## 2015-09-25 NOTE — Assessment & Plan Note (Signed)
Check urine today 

## 2015-09-25 NOTE — Progress Notes (Signed)
BP 149/81 mmHg  Pulse 81  Temp(Src) 98.8 F (37.1 C)  Ht 5' 0.2" (1.529 m)  Wt 157 lb 9.6 oz (71.487 kg)  BMI 30.58 kg/m2  SpO2 97%   Subjective:    Patient ID: Angela Duffy, female    DOB: 1935-01-15, 81 y.o.   MRN: [id number]  HPI: Angela Duffy is a 81 y.o. female  Chief Complaint  Patient presents with  . Hyperlipidemia  . Hypertension   She was eating a lot over the snow when she was stuck in the house; she has gained a little weight since last visit; now eating more fruit and trying to cut down on that extra weight  She has high blood pressure, but she hasn't had any of her BP medicine today (combo pill of two meds); because sends her to the bathroom; she thinks that with all the medicines she has been on, not just the ones we prescribed here but from other doctors as well; the clonidine caused breast enlargement; that was from Dr. Ubaldo Glassing; she was going to try it again, but then remembered the breast engorgement and then never started back; her first pill was enalapril and she had a reaction to the first BP pill she has taken; she has taken a little bit of everything; she says that her pills have interfered with her quality of life; she wants to just eat better and limit stress and deal with it that way; took aleve for six days for frozen left shoulder  High cholesterol; atherosclerosis in multiple locations; she goes to see Dr. Ronalee Belts next week; she is on the statin; did have a tic tac and some cream in her coffee this morning, but otherwise fasting  Does have a lot of cramps in her legs, sides of the calves  Relevant past medical, surgical, family and social history reviewed and updated as indicated. Interim medical history since our last visit reviewed. Allergies and medications reviewed and updated.  Review of Systems  Constitutional: Positive for unexpected weight change (gained weight over snow holiday).  Cardiovascular: Negative for chest pain and leg  swelling.  Neurological: Negative for headaches.  Per HPI unless specifically indicated above     Objective:    BP 149/81 mmHg  Pulse 81  Temp(Src) 98.8 F (37.1 C)  Ht 5' 0.2" (1.529 m)  Wt 157 lb 9.6 oz (71.487 kg)  BMI 30.58 kg/m2  SpO2 97%  Wt Readings from Last 3 Encounters:  09/25/15 157 lb 9.6 oz (71.487 kg)  07/21/15 153 lb (69.4 kg)  06/18/15 156 lb (70.761 kg)    Today's Vitals   09/25/15 1052 09/25/15 1120 09/25/15 1121  BP: 195/72 172/80 149/81  Pulse: 81    Temp: 98.8 F (37.1 C)    Height: 5' 0.2" (1.529 m)    Weight: 157 lb 9.6 oz (71.487 kg)    SpO2: 97%    lower blood pressure is in the RIGHT arm  Physical Exam  Constitutional: She appears well-developed and well-nourished. No distress.  HENT:  Head: Normocephalic and atraumatic.  Eyes: EOM are normal. No scleral icterus.  Neck: No thyromegaly present.  Cardiovascular: Normal rate, regular rhythm and normal heart sounds.   No murmur heard. Pulmonary/Chest: Effort normal and breath sounds normal. No respiratory distress. She has no wheezes.  Abdominal: Soft. Bowel sounds are normal. She exhibits no distension.  Musculoskeletal: Normal range of motion. She exhibits no edema.  Neurological: She is alert. She exhibits normal muscle tone.  Skin:  Skin is warm and dry. She is not diaphoretic. No pallor.  Psychiatric: She has a normal mood and affect. Her behavior is normal. Judgment and thought content normal.      Assessment & Plan:   Problem List Items Addressed This Visit      Cardiovascular and Mediastinum   Hypertension - Primary    I had a lengthy talk with patient about her blood pressure, the difference left to right arm, what medicines she has tried in the past, risk of stroke, etc; she does not want any additional medicine, despite the risk of stroke or secondary problems from high pressure; she wants to try relaxation, stress reduction, weight loss, less salt, DASH guidelines, etc.; I again  offered additional medicine, pointed out risk of stroke again and she still declined      Aortic stenosis    She sees Dr. Ubaldo Glassing; widened pulse pressure      Atherosclerosis    on Chest CT 08/2014; 70-80% innominate artery, right subclavian artery, 75% celiac artery, 3 vessel coronary artery involvement; hx of left carotid endarterectomy; she sees vascular doctor, Dr. Ronalee Belts, next week; check lipids today and goal LDL under 70      Relevant Orders   Lipid Panel w/o Chol/HDL Ratio (Completed)     Endocrine   IFG (impaired fasting glucose)    Check A1c      Relevant Orders   Hgb A1c w/o eAG (Completed)     Musculoskeletal and Integument   Bursitis of left shoulder    Patient will call and make her own appt        Genitourinary   CKD (chronic kidney disease) stage 3, GFR 30-59 ml/min    Check creatinine today; avoid NSAIDs        Other   Microalbuminuria    Check urine today      Relevant Orders   Hgb A1c w/o eAG (Completed)   Microalbumin, Urine Waived (Completed)   Medication monitoring encounter   Relevant Orders   CBC with Differential/Platelet (Completed)   Comprehensive metabolic panel (Completed)      Follow up plan: Return in about 4 weeks (around 10/23/2015) for folllow-up, Medicare visit when due.  An after-visit summary was printed and given to the patient at [place].  Please see the patient instructions which may contain other information and recommendations beyond what is mentioned above in the assessment and plan.  Orders Placed This Encounter  Procedures  . Hgb A1c w/o eAG  . Lipid Panel w/o Chol/HDL Ratio  . CBC with Differential/Platelet  . Comprehensive metabolic panel  . Microalbumin, Urine Waived

## 2015-09-25 NOTE — Patient Instructions (Addendum)
Your goal blood pressure is less than 150 mmHg on top. Try to follow the DASH guidelines (DASH stands for Dietary Approaches to Stop Hypertension) Try to limit the sodium in your diet.  Ideally, consume less than 1.5 grams (less than 1,500mg ) per day. Do not add salt when cooking or at the table.  Check the sodium amount on labels when shopping, and choose items lower in sodium when given a choice. Avoid or limit foods that already contain a lot of sodium. Eat a diet rich in fruits and vegetables and whole grains. Please let Dr. Ronalee Belts know that your blood pressure was lower in the right arm today, and he can check your for a blockage there If you need something for aches or pains, try to use Tylenol (acetaminphen) instead of non-steroidals (which include Aleve, ibuprofen, Advil, Motrin, and naproxen); non-steroidals can cause long-term kidney damage Try to use PLAIN allergy medicine without the decongestant Avoid: phenylephrine, phenylpropanolamine, and pseudoephredine Please do take your blood pressure medicine as soon as you get home I am so glad that you are trying to limit your stress Try to limit saturated fats in your diet (bologna, hot dogs, barbeque, cheeseburgers, hamburgers, steak, bacon, sausage, cheese, etc.) and get more fresh fruits, vegetables, and whole grains Try to lose 5 pounds over the next 1-2 months, and that should help bring your blood pressure down as well  DASH Eating Plan DASH stands for "Dietary Approaches to Stop Hypertension." The DASH eating plan is a healthy eating plan that has been shown to reduce high blood pressure (hypertension). Additional health benefits may include reducing the risk of type 2 diabetes mellitus, heart disease, and stroke. The DASH eating plan may also help with weight loss. WHAT DO I NEED TO KNOW ABOUT THE DASH EATING PLAN? For the DASH eating plan, you will follow these general guidelines:  Choose foods with a percent daily value for sodium  of less than 5% (as listed on the food label).  Use salt-free seasonings or herbs instead of table salt or sea salt.  Check with your health care provider or pharmacist before using salt substitutes.  Eat lower-sodium products, often labeled as "lower sodium" or "no salt added."  Eat fresh foods.  Eat more vegetables, fruits, and low-fat dairy products.  Choose whole grains. Look for the word "whole" as the first word in the ingredient list.  Choose fish and skinless chicken or [place] more often than red meat. Limit fish, poultry, and meat to 6 oz (170 g) each day.  Limit sweets, desserts, sugars, and sugary drinks.  Choose heart-healthy fats.  Limit cheese to 1 oz (28 g) per day.  Eat more home-cooked food and less restaurant, buffet, and fast food.  Limit fried foods.  Cook foods using methods other than frying.  Limit canned vegetables. If you do use them, rinse them well to decrease the sodium.  When eating at a restaurant, ask that your food be prepared with less salt, or no salt if possible. WHAT FOODS CAN I EAT? Seek help from a dietitian for individual calorie needs. Grains Whole grain or whole wheat bread. Brown rice. Whole grain or whole wheat pasta. Quinoa, bulgur, and whole grain cereals. Low-sodium cereals. Corn or whole wheat flour tortillas. Whole grain cornbread. Whole grain crackers. Low-sodium crackers. Vegetables Fresh or frozen vegetables (raw, steamed, roasted, or grilled). Low-sodium or reduced-sodium tomato and vegetable juices. Low-sodium or reduced-sodium tomato sauce and paste. Low-sodium or reduced-sodium canned vegetables.  Fruits All fresh,  canned (in natural juice), or frozen fruits. Meat and Other Protein Products Ground beef (85% or leaner), grass-fed beef, or beef trimmed of fat. Skinless chicken or [place]. Ground chicken or [place]. Pork trimmed of fat. All fish and seafood. Eggs. Dried beans, peas, or lentils. Unsalted nuts and seeds. Unsalted  canned beans. Dairy Low-fat dairy products, such as skim or 1% milk, 2% or reduced-fat cheeses, low-fat ricotta or cottage cheese, or plain low-fat yogurt. Low-sodium or reduced-sodium cheeses. Fats and Oils Tub margarines without trans fats. Light or reduced-fat mayonnaise and salad dressings (reduced sodium). Avocado. Safflower, olive, or canola oils. Natural peanut or almond butter. Other Unsalted popcorn and pretzels. The items listed above may not be a complete list of recommended foods or beverages. Contact your dietitian for more options. WHAT FOODS ARE NOT RECOMMENDED? Grains White bread. White pasta. White rice. Refined cornbread. Bagels and croissants. Crackers that contain trans fat. Vegetables Creamed or fried vegetables. Vegetables in a cheese sauce. Regular canned vegetables. Regular canned tomato sauce and paste. Regular tomato and vegetable juices. Fruits Dried fruits. Canned fruit in light or heavy syrup. Fruit juice. Meat and Other Protein Products Fatty cuts of meat. Ribs, chicken wings, bacon, sausage, bologna, salami, chitterlings, fatback, hot dogs, bratwurst, and packaged luncheon meats. Salted nuts and seeds. Canned beans with salt. Dairy Whole or 2% milk, cream, half-and-half, and cream cheese. Whole-fat or sweetened yogurt. Full-fat cheeses or blue cheese. Nondairy creamers and whipped toppings. Processed cheese, cheese spreads, or cheese curds. Condiments Onion and garlic salt, seasoned salt, table salt, and sea salt. Canned and packaged gravies. Worcestershire sauce. Tartar sauce. Barbecue sauce. Teriyaki sauce. Soy sauce, including reduced sodium. Steak sauce. Fish sauce. Oyster sauce. Cocktail sauce. Horseradish. Ketchup and mustard. Meat flavorings and tenderizers. Bouillon cubes. Hot sauce. Tabasco sauce. Marinades. Taco seasonings. Relishes. Fats and Oils Butter, stick margarine, lard, shortening, ghee, and bacon fat. Coconut, palm kernel, or palm oils. Regular  salad dressings. Other Pickles and olives. Salted popcorn and pretzels. The items listed above may not be a complete list of foods and beverages to avoid. Contact your dietitian for more information. WHERE CAN I FIND MORE INFORMATION? National Heart, Lung, and Blood Institute: travelstabloid.com   This information is not intended to replace advice given to you by your health care provider. Make sure you discuss any questions you have with your health care provider.   Document Released: 08/12/2011 Document Revised: 09/13/2014 Document Reviewed: 06/27/2013 Elsevier Interactive Patient Education 2016 Elsevier Inc. Cholesterol Cholesterol is a white, waxy, fat-like substance needed by your body in small amounts. The liver makes all the cholesterol you need. Cholesterol is carried from the liver by the blood through the blood vessels. Deposits of cholesterol (plaque) may build up on blood vessel walls. These make the arteries narrower and stiffer. Cholesterol plaques increase the risk for heart attack and stroke.  You cannot feel your cholesterol level even if it is very high. The only way to know it is high is with a blood test. Once you know your cholesterol levels, you should keep a record of the test results. Work with your health care provider to keep your levels in the desired range.  WHAT DO THE RESULTS MEAN?  Total cholesterol is a rough measure of all the cholesterol in your blood.   LDL is the so-called bad cholesterol. This is the type that deposits cholesterol in the walls of the arteries. You want this level to be low.   HDL is the good cholesterol  because it cleans the arteries and carries the LDL away. You want this level to be high.  Triglycerides are fat that the body can either burn for energy or store. High levels are closely linked to heart disease.  WHAT ARE THE DESIRED LEVELS OF CHOLESTEROL?  Total cholesterol below 200.   LDL below  100 for people at risk, below 70 for those at very high risk.   HDL above 50 is good, above 60 is best.   Triglycerides below 150.  HOW CAN I LOWER MY CHOLESTEROL?  Diet. Follow your diet programs as directed by your health care provider.   Choose fish or white meat chicken and [place], roasted or baked. Limit fatty cuts of red meat, fried foods, and processed meats, such as sausage and lunch meats.   Eat lots of fresh fruits and vegetables.  Choose whole grains, beans, pasta, potatoes, and cereals.   Use only small amounts of olive, corn, or canola oils.   Avoid butter, mayonnaise, shortening, or palm kernel oils.  Avoid foods with trans fats.   Drink skim or nonfat milk and eat low-fat or nonfat yogurt and cheeses. Avoid whole milk, cream, ice cream, egg yolks, and full-fat cheeses.   Healthy desserts include angel food cake, ginger snaps, animal crackers, hard candy, popsicles, and low-fat or nonfat frozen yogurt. Avoid pastries, cakes, pies, and cookies.   Exercise. Follow your exercise programs as directed by your health care provider.   A regular program helps decrease LDL and raise HDL.   A regular program helps with weight control.   Do things that increase your activity level like gardening, walking, or taking the stairs. Ask your health care provider about how you can be more active in your daily life.   Medicine. Take medicine only as directed by your health care provider.   Medicine may be prescribed by your health care provider to help lower cholesterol and decrease the risk for heart disease.   If you have several risk factors, you may need medicine even if your levels are normal.   This information is not intended to replace advice given to you by your health care provider. Make sure you discuss any questions you have with your health care provider.   Document Released: 05/18/2001 Document Revised: 09/13/2014 Document Reviewed:  06/06/2013 Elsevier Interactive Patient Education [employer].

## 2015-09-25 NOTE — Assessment & Plan Note (Signed)
Check A1c. 

## 2015-09-25 NOTE — Assessment & Plan Note (Signed)
She sees Dr. Ubaldo Glassing; widened pulse pressure

## 2015-09-25 NOTE — Assessment & Plan Note (Signed)
Check creatinine today; avoid NSAIDs 

## 2015-09-25 NOTE — Assessment & Plan Note (Signed)
Patient will call and make her own appt

## 2015-09-25 NOTE — Assessment & Plan Note (Signed)
on Chest CT 08/2014; 70-80% innominate artery, right subclavian artery, 75% celiac artery, 3 vessel coronary artery involvement; hx of left carotid endarterectomy; she sees vascular doctor, Dr. Ronalee Belts, next week; check lipids today and goal LDL under 70

## 2015-09-26 LAB — COMPREHENSIVE METABOLIC PANEL
A/G RATIO: 1.6 (ref 1.1–2.5)
ALT: 21 IU/L (ref 0–32)
AST: 26 IU/L (ref 0–40)
Albumin: 4.1 g/dL (ref 3.5–4.8)
Alkaline Phosphatase: 74 IU/L (ref 39–117)
BUN/Creatinine Ratio: 15 (ref 11–26)
BUN: 12 mg/dL (ref 8–27)
Bilirubin Total: 0.3 mg/dL (ref 0.0–1.2)
CALCIUM: 9.5 mg/dL (ref 8.7–10.3)
CO2: 25 mmol/L (ref 18–29)
CREATININE: 0.79 mg/dL (ref 0.57–1.00)
Chloride: 98 mmol/L (ref 96–106)
GFR, EST AFRICAN AMERICAN: 83 mL/min/{1.73_m2} (ref >59)
GFR, EST NON AFRICAN AMERICAN: 72 mL/min/{1.73_m2} (ref >59)
GLOBULIN, TOTAL: 2.6 g/dL (ref 1.5–4.5)
Glucose: 96 mg/dL (ref 65–99)
POTASSIUM: 4 mmol/L (ref 3.5–5.2)
SODIUM: 142 mmol/L (ref 134–144)
TOTAL PROTEIN: 6.7 g/dL (ref 6.0–8.5)

## 2015-09-26 LAB — CBC WITH DIFFERENTIAL/PLATELET
BASOS: 0 %
Basophils Absolute: 0 10*3/uL (ref 0.0–0.2)
EOS (ABSOLUTE): 0.1 10*3/uL (ref 0.0–0.4)
EOS: 1 %
HEMATOCRIT: 34.7 % (ref 34.0–46.6)
Hemoglobin: 11.1 g/dL (ref 11.1–15.9)
IMMATURE GRANS (ABS): 0 10*3/uL (ref 0.0–0.1)
IMMATURE GRANULOCYTES: 0 %
LYMPHS: 13 %
Lymphocytes Absolute: 0.8 10*3/uL (ref 0.7–3.1)
MCH: 26.2 pg — ABNORMAL LOW (ref 26.6–33.0)
MCHC: 32 g/dL (ref 31.5–35.7)
MCV: 82 fL (ref 79–97)
MONOCYTES: 8 %
Monocytes Absolute: 0.5 10*3/uL (ref 0.1–0.9)
NEUTROS PCT: 78 %
Neutrophils Absolute: 4.9 10*3/uL (ref 1.4–7.0)
Platelets: 258 10*3/uL (ref 150–379)
RBC: 4.24 x10E6/uL (ref 3.77–5.28)
RDW: 16.8 % — AB (ref 12.3–15.4)
WBC: 6.3 10*3/uL (ref 3.4–10.8)

## 2015-09-26 LAB — LIPID PANEL W/O CHOL/HDL RATIO
Cholesterol, Total: 218 mg/dL — ABNORMAL HIGH (ref 100–199)
HDL: 50 mg/dL (ref >39)
LDL Calculated: 137 mg/dL — ABNORMAL HIGH (ref 0–99)
Triglycerides: 157 mg/dL — ABNORMAL HIGH (ref 0–149)
VLDL Cholesterol Cal: 31 mg/dL (ref 5–40)

## 2015-09-26 LAB — HGB A1C W/O EAG: HEMOGLOBIN A1C: 5.9 % — AB (ref 4.8–5.6)

## 2015-09-26 NOTE — Assessment & Plan Note (Signed)
I had a lengthy talk with patient about her blood pressure, the difference left to right arm, what medicines she has tried in the past, risk of stroke, etc; she does not want any additional medicine, despite the risk of stroke or secondary problems from high pressure; she wants to try relaxation, stress reduction, weight loss, less salt, DASH guidelines, etc.; I again offered additional medicine, pointed out risk of stroke again and she still declined

## 2015-09-30 ENCOUNTER — Telehealth: Payer: Self-pay | Admitting: Family Medicine

## 2015-09-30 DIAGNOSIS — I7090 Unspecified atherosclerosis: Secondary | ICD-10-CM

## 2015-09-30 DIAGNOSIS — Z5181 Encounter for therapeutic drug level monitoring: Secondary | ICD-10-CM

## 2015-09-30 DIAGNOSIS — R809 Proteinuria, unspecified: Secondary | ICD-10-CM

## 2015-09-30 DIAGNOSIS — I10 Essential (primary) hypertension: Secondary | ICD-10-CM

## 2015-09-30 DIAGNOSIS — N183 Chronic kidney disease, stage 3 unspecified: Secondary | ICD-10-CM

## 2015-09-30 NOTE — Assessment & Plan Note (Signed)
Refer to nephrologist 

## 2015-09-30 NOTE — Telephone Encounter (Signed)
Please let patient know that her prediabetes is stable Her cholesterol is too high; the LDL has actually gone up; I'd like to suggest we switch her simvastatin to something more powerful; if she is agreeable, back to me to start Crestor Her kidneys are spilling proteins, so at this point I do recommend she see a kidney doctor (nephrologist) to help [place] manage that, to preserve her kidneys for years to come, and they may find something that helps bring her BP down; I entered referral already AVOID NSAIDs completely

## 2015-10-01 MED ORDER — ROSUVASTATIN CALCIUM 10 MG PO TABS: 10.0000 mg | ORAL_TABLET | Freq: Every day | ORAL | Status: DC

## 2015-10-01 NOTE — Assessment & Plan Note (Signed)
Change statin to Crestor; recheck lipids in 6-8 weeks; goal LDL under 70

## 2015-10-01 NOTE — Telephone Encounter (Signed)
Patient notified. She she would be happy to take the Crestor. She says she took it when it first came out and it worked wonderful but it was too expensive back then. I advised her it is generic now.

## 2015-10-01 NOTE — Assessment & Plan Note (Addendum)
Check SGPT 6-8 weeks after starting Crestor

## 2015-10-01 NOTE — Telephone Encounter (Signed)
Great; new Rx sent; note to pharmacy cancel old simvastatin refills; new orders for SGPT and lipids entered, check labs 6-8 weeks after making the switch

## 2015-10-02 DIAGNOSIS — I722 Aneurysm of renal artery: Secondary | ICD-10-CM | POA: Diagnosis not present

## 2015-10-02 DIAGNOSIS — I739 Peripheral vascular disease, unspecified: Secondary | ICD-10-CM | POA: Diagnosis not present

## 2015-10-02 DIAGNOSIS — N183 Chronic kidney disease, stage 3 (moderate): Secondary | ICD-10-CM | POA: Diagnosis not present

## 2015-10-02 DIAGNOSIS — E785 Hyperlipidemia, unspecified: Secondary | ICD-10-CM | POA: Diagnosis not present

## 2015-10-02 DIAGNOSIS — I10 Essential (primary) hypertension: Secondary | ICD-10-CM | POA: Diagnosis not present

## 2015-10-02 DIAGNOSIS — I7100 Dissection of unspecified site of aorta: Secondary | ICD-10-CM | POA: Diagnosis not present

## 2015-10-02 DIAGNOSIS — I129 Hypertensive chronic kidney disease with stage 1 through stage 4 chronic kidney disease, or unspecified chronic kidney disease: Secondary | ICD-10-CM | POA: Diagnosis not present

## 2015-10-02 DIAGNOSIS — M79609 Pain in unspecified limb: Secondary | ICD-10-CM | POA: Diagnosis not present

## 2015-10-02 DIAGNOSIS — I70213 Atherosclerosis of native arteries of extremities with intermittent claudication, bilateral legs: Secondary | ICD-10-CM | POA: Diagnosis not present

## 2015-10-02 DIAGNOSIS — I701 Atherosclerosis of renal artery: Secondary | ICD-10-CM | POA: Diagnosis not present

## 2015-10-02 DIAGNOSIS — I7090 Unspecified atherosclerosis: Secondary | ICD-10-CM | POA: Diagnosis not present

## 2015-10-23 ENCOUNTER — Ambulatory Visit (INDEPENDENT_AMBULATORY_CARE_PROVIDER_SITE_OTHER): Payer: Medicare Other | Admitting: Family Medicine

## 2015-10-23 ENCOUNTER — Encounter: Payer: Self-pay | Admitting: Family Medicine

## 2015-10-23 VITALS — BP 143/85 | HR 70 | Temp 97.9°F | Ht 60.1 in | Wt 156.2 lb

## 2015-10-23 DIAGNOSIS — I10 Essential (primary) hypertension: Secondary | ICD-10-CM

## 2015-10-23 DIAGNOSIS — Z5181 Encounter for therapeutic drug level monitoring: Secondary | ICD-10-CM

## 2015-10-23 DIAGNOSIS — N183 Chronic kidney disease, stage 3 unspecified: Secondary | ICD-10-CM

## 2015-10-23 DIAGNOSIS — I739 Peripheral vascular disease, unspecified: Secondary | ICD-10-CM | POA: Diagnosis not present

## 2015-10-23 DIAGNOSIS — R809 Proteinuria, unspecified: Secondary | ICD-10-CM

## 2015-10-23 DIAGNOSIS — I701 Atherosclerosis of renal artery: Secondary | ICD-10-CM | POA: Diagnosis not present

## 2015-10-23 NOTE — Progress Notes (Signed)
BP 143/85 mmHg  Pulse 70  Temp(Src) 97.9 F (36.6 C)  Ht 5' 0.1" (1.527 m)  Wt 156 lb 3.2 oz (70.852 kg)  BMI 30.39 kg/m2  SpO2 97%   Subjective:    Patient ID: Angela Duffy, female    DOB: [birth year], 81 y.o.   MRN: [id number]  HPI: Angela Duffy is a 81 y.o. female  Chief Complaint  Patient presents with  . Hypertension  . Hyperlipidemia  . Medication Problem    pt states she called the pharmacy and they stated they never got the rx for rosuvastatin so she has still been taking simvastatin   She has blockages in her arteries and has seen Dr. Ronalee Belts; she was leaking protein in her urine and he said it was partially blocked; the left kidney artery was fine; he has already put a stent in the right renal artery; she had her left carotid cleaned out at [facility]; he did an [place] of her arteries since she was here last and he said there is no emergency but he wants to do surgery, but not an emergency; he did the scan of the chest and he said she did have some blockages in her chest and she did not surgery quite yet, that is her understanding  She has not started the crestor yet; I sent rx on 10/01/15; she called pharmacy and they said they didn't have it; my CMA called them just now and staff said that they filled it on 10/02/15 and it's there on the shelf; she has been on Crestor when it first came out and was too $$$  She feels great, but when when she takes her medicine, she feels lousy  Cannot use the left arm/shoulder to wash her hair; she does not have an orthopaedist, but the nurse at the cardiology office recommended she see someone at [facility] and she will call him to schedule her own appointment (ortho); no longer taking NSAIDs; offered pain medicine, but she got hooked on pain meds back when she had a cervical disc surgery; does not want to go back on anything like that; she can take tylenol and has heard of turmeric and is taking a little of that  She sees Dr. Candiss Norse  tomorrow for her blood pressure; she is on the triamterene/HCTZ; avoiding NSAIDs; asked about "kidney disease"; she was not aware that he had CKD  Last year is starting like last year with doctor visits and issues  She is prediabetes; last two A1cs reviewed; A1c explained in detail; she watches the sugar she eats  She wants the info so her daughter can see her chart (directing her to Barnett Applebaum for that here in our office)  Relevant past medical, surgical, family and social history reviewed and updated as indicated. Interim medical history since our last visit reviewed. Allergies and medications reviewed and updated.  Review of Systems Per HPI unless specifically indicated above     Objective:    BP 143/85 mmHg  Pulse 70  Temp(Src) 97.9 F (36.6 C)  Ht 5' 0.1" (1.527 m)  Wt 156 lb 3.2 oz (70.852 kg)  BMI 30.39 kg/m2  SpO2 97%  Wt Readings from Last 3 Encounters:  10/23/15 156 lb 3.2 oz (70.852 kg)  09/25/15 157 lb 9.6 oz (71.487 kg)  07/21/15 153 lb (69.4 kg)    Filed Vitals:   10/23/15 1025 10/23/15 1036  BP: 187/76 143/85  Pulse: 70   Temp: 97.9 F (36.6 C)   Height:  5' 0.1" (1.527 m)   Weight: 156 lb 3.2 oz (70.852 kg)   SpO2: 97%    Physical Exam  Constitutional: She appears well-developed and well-nourished. No distress.  Obese, weight down 1 pound since last visit 4 weeks ago  HENT:  Head: Normocephalic and atraumatic.  Eyes: EOM are normal. No scleral icterus.  Neck: No thyromegaly present.  Cardiovascular: Normal rate, regular rhythm and normal heart sounds.   No murmur heard. Pulmonary/Chest: Effort normal and breath sounds normal. No respiratory distress. She has no wheezes.  Musculoskeletal: Normal range of motion. She exhibits no edema.  Neurological: She is alert. She exhibits normal muscle tone.  Skin: Skin is warm and dry. She is not diaphoretic. No pallor.  Psychiatric: She has a normal mood and affect. Her behavior is normal. Judgment and thought  content normal.    Results for orders placed or performed in visit on 09/25/15  Hgb A1c w/o eAG  Result Value Ref Range   Hgb A1c MFr Bld 5.9 (H) 4.8 - 5.6 %  Lipid Panel w/o Chol/HDL Ratio  Result Value Ref Range   Cholesterol, Total 218 (H) 100 - 199 mg/dL   Triglycerides 157 (H) 0 - 149 mg/dL   HDL 50 >39 mg/dL   VLDL Cholesterol Cal 31 5 - 40 mg/dL   LDL Calculated 137 (H) 0 - 99 mg/dL  CBC with Differential/Platelet  Result Value Ref Range   WBC 6.3 3.4 - 10.8 x10E3/uL   RBC 4.24 3.77 - 5.28 x10E6/uL   Hemoglobin 11.1 11.1 - 15.9 g/dL   Hematocrit 34.7 34.0 - 46.6 %   MCV 82 79 - 97 fL   MCH 26.2 (L) 26.6 - 33.0 pg   MCHC 32.0 31.5 - 35.7 g/dL   RDW 16.8 (H) 12.3 - 15.4 %   Platelets 258 150 - 379 x10E3/uL   Neutrophils 78 %   Lymphs 13 %   Monocytes 8 %   Eos 1 %   Basos 0 %   Neutrophils Absolute 4.9 1.4 - 7.0 x10E3/uL   Lymphocytes Absolute 0.8 0.7 - 3.1 x10E3/uL   Monocytes Absolute 0.5 0.1 - 0.9 x10E3/uL   EOS (ABSOLUTE) 0.1 0.0 - 0.4 x10E3/uL   Basophils Absolute 0.0 0.0 - 0.2 x10E3/uL   Immature Granulocytes 0 %   Immature Grans (Abs) 0.0 0.0 - 0.1 x10E3/uL  Comprehensive metabolic panel  Result Value Ref Range   Glucose 96 65 - 99 mg/dL   BUN 12 8 - 27 mg/dL   Creatinine, Ser 0.79 0.57 - 1.00 mg/dL   GFR calc non Af Amer 72 >59 mL/min/1.73   GFR calc Af Amer 83 >59 mL/min/1.73   BUN/Creatinine Ratio 15 11 - 26   Sodium 142 134 - 144 mmol/L   Potassium 4.0 3.5 - 5.2 mmol/L   Chloride 98 96 - 106 mmol/L   CO2 25 18 - 29 mmol/L   Calcium 9.5 8.7 - 10.3 mg/dL   Total Protein 6.7 6.0 - 8.5 g/dL   Albumin 4.1 3.5 - 4.8 g/dL   Globulin, Total 2.6 1.5 - 4.5 g/dL   Albumin/Globulin Ratio 1.6 1.1 - 2.5   Bilirubin Total 0.3 0.0 - 1.2 mg/dL   Alkaline Phosphatase 74 39 - 117 IU/L   AST 26 0 - 40 IU/L   ALT 21 0 - 32 IU/L  Microalbumin, Urine Waived  Result Value Ref Range   Microalb, Ur Waived 150 (H) 0 - 19 mg/L   Creatinine, Urine Waived 50 10 -  300  mg/dL   Microalb/Creat Ratio >300 (H) <30 mg/g      Assessment & Plan:   Problem List Items Addressed This Visit      Cardiovascular and Mediastinum   Hypertension - Primary    We have discussed her blood pressure issues and she will see nephrologist tomorrow; risk of stroke, call 911 with any symptoms; weight loss, DASH guidelines; so glad she quit smoking; suspect vascular component to her elevated pressures, but she says she is following up and has had studies with vascular doctor; we checked her pressures again in left and right arm and she has significant difference so I suspect blockage, and patient will follow with vascular      PVD (peripheral vascular disease) ([place])    Continue to f/u with vascular; she has quit smoking; suggested statin, would love to have some reversal of plaque if we can get LDL under 70; check lipids fasting and sgpt in 6-8 weeks      Relevant Orders   Lipid Panel w/o Chol/HDL Ratio   Renal artery atherosclerosis ([place])    Managed by vascular doctor; suspect this plays a part with her HTN; seeing nephrologist tomorrow and will f/u with vascular; start statin to see if we can get some reversal of plaque      Relevant Orders   Lipid Panel w/o Chol/HDL Ratio     Genitourinary   CKD (chronic kidney disease) stage 3, GFR 30-59 ml/min    Discussed dx with patient, reviewed previous labs; reviewed urine; she sees nephrologist tomorrow        Other   Microalbuminuria    See nephrologist tomorrow; avoid NSAIDs; work to get BP under control with nephrologist, and also see vascular surgeon to see if vascular etiology which can be managed      Medication monitoring encounter    Check sgpt 6-8 weeks after starting statin      Relevant Orders   ALT      Follow up plan: Return 6-8 weeks, for fasting labs only.  cc: Dr. Candiss Norse, Dr. Ronalee Belts  Orders Placed This Encounter  Procedures  . Lipid Panel w/o Chol/HDL Ratio  . ALT   An after-visit summary was  printed and given to the patient at [place].  Please see the patient instructions which may contain other information and recommendations beyond what is mentioned above in the assessment and plan.  Face-to-face time with patient was more than 25 minutes, >50% time spent counseling and coordination of care

## 2015-10-23 NOTE — Patient Instructions (Addendum)
GINA --> please help patient with giving daughter permission to check MyChart Start the generic Crestor (rosuvastatin) Let's recheck your cholesterol (fasting) and one liver enzyme in 6-8 weeks Keep up the great job with limit salt and following DASH guidelines See the kidney doctor tomorrow We'll send a copy of the note to your vascular doctor Limit red meat Try to get more of your protein from plants instead of animals Try to limit saturated fats in your diet (bologna, hot dogs, barbeque, cheeseburgers, hamburgers, steak, bacon, sausage, cheese, etc.) and get more fresh fruits, vegetables, and whole grains

## 2015-10-24 DIAGNOSIS — R809 Proteinuria, unspecified: Secondary | ICD-10-CM | POA: Diagnosis not present

## 2015-10-24 DIAGNOSIS — I701 Atherosclerosis of renal artery: Secondary | ICD-10-CM | POA: Diagnosis not present

## 2015-10-24 DIAGNOSIS — I10 Essential (primary) hypertension: Secondary | ICD-10-CM | POA: Diagnosis not present

## 2015-11-01 NOTE — Assessment & Plan Note (Signed)
Managed by vascular doctor; suspect this plays a part with her HTN; seeing nephrologist tomorrow and will f/u with vascular; start statin to see if we can get some reversal of plaque

## 2015-11-01 NOTE — Assessment & Plan Note (Signed)
We have discussed her blood pressure issues and she will see nephrologist tomorrow; risk of stroke, call 911 with any symptoms; weight loss, DASH guidelines; so glad she quit smoking; suspect vascular component to her elevated pressures, but she says she is following up and has had studies with vascular doctor; we checked her pressures again in left and right arm and she has significant difference so I suspect blockage, and patient will follow with vascular

## 2015-11-01 NOTE — Assessment & Plan Note (Signed)
Check sgpt 6-8 weeks after starting statin

## 2015-11-01 NOTE — Assessment & Plan Note (Signed)
Discussed dx with patient, reviewed previous labs; reviewed urine; she sees nephrologist tomorrow

## 2015-11-01 NOTE — Assessment & Plan Note (Signed)
Continue to f/u with vascular; she has quit smoking; suggested statin, would love to have some reversal of plaque if we can get LDL under 70; check lipids fasting and sgpt in 6-8 weeks

## 2015-11-01 NOTE — Assessment & Plan Note (Signed)
See nephrologist tomorrow; avoid NSAIDs; work to get BP under control with nephrologist, and also see vascular surgeon to see if vascular etiology which can be managed

## 2015-11-12 DIAGNOSIS — H2512 Age-related nuclear cataract, left eye: Secondary | ICD-10-CM | POA: Diagnosis not present

## 2015-11-12 DIAGNOSIS — H353221 Exudative age-related macular degeneration, left eye, with active choroidal neovascularization: Secondary | ICD-10-CM | POA: Diagnosis not present

## 2015-11-12 DIAGNOSIS — H353112 Nonexudative age-related macular degeneration, right eye, intermediate dry stage: Secondary | ICD-10-CM | POA: Diagnosis not present

## 2015-11-12 DIAGNOSIS — H25012 Cortical age-related cataract, left eye: Secondary | ICD-10-CM | POA: Diagnosis not present

## 2015-11-12 DIAGNOSIS — H2511 Age-related nuclear cataract, right eye: Secondary | ICD-10-CM | POA: Diagnosis not present

## 2015-11-12 DIAGNOSIS — H40013 Open angle with borderline findings, low risk, bilateral: Secondary | ICD-10-CM | POA: Diagnosis not present

## 2015-11-12 DIAGNOSIS — H25011 Cortical age-related cataract, right eye: Secondary | ICD-10-CM | POA: Diagnosis not present

## 2015-11-21 ENCOUNTER — Encounter (INDEPENDENT_AMBULATORY_CARE_PROVIDER_SITE_OTHER): Payer: Medicare Other | Admitting: Ophthalmology

## 2015-11-21 DIAGNOSIS — H2513 Age-related nuclear cataract, bilateral: Secondary | ICD-10-CM

## 2015-11-21 DIAGNOSIS — H353112 Nonexudative age-related macular degeneration, right eye, intermediate dry stage: Secondary | ICD-10-CM | POA: Diagnosis not present

## 2015-11-21 DIAGNOSIS — D3131 Benign neoplasm of right choroid: Secondary | ICD-10-CM | POA: Diagnosis not present

## 2015-11-21 DIAGNOSIS — H43813 Vitreous degeneration, bilateral: Secondary | ICD-10-CM | POA: Diagnosis not present

## 2015-11-21 DIAGNOSIS — H353221 Exudative age-related macular degeneration, left eye, with active choroidal neovascularization: Secondary | ICD-10-CM | POA: Diagnosis not present

## 2015-11-21 DIAGNOSIS — H35033 Hypertensive retinopathy, bilateral: Secondary | ICD-10-CM

## 2015-11-21 DIAGNOSIS — I10 Essential (primary) hypertension: Secondary | ICD-10-CM

## 2015-11-25 DIAGNOSIS — H2511 Age-related nuclear cataract, right eye: Secondary | ICD-10-CM | POA: Diagnosis not present

## 2015-12-03 ENCOUNTER — Other Ambulatory Visit: Payer: Medicare Other

## 2015-12-05 DIAGNOSIS — H2511 Age-related nuclear cataract, right eye: Secondary | ICD-10-CM | POA: Diagnosis not present

## 2015-12-26 ENCOUNTER — Encounter (INDEPENDENT_AMBULATORY_CARE_PROVIDER_SITE_OTHER): Payer: Medicare Other | Admitting: Ophthalmology

## 2015-12-29 ENCOUNTER — Encounter (INDEPENDENT_AMBULATORY_CARE_PROVIDER_SITE_OTHER): Payer: Medicare Other | Admitting: Ophthalmology

## 2015-12-29 DIAGNOSIS — H43813 Vitreous degeneration, bilateral: Secondary | ICD-10-CM

## 2015-12-29 DIAGNOSIS — H2512 Age-related nuclear cataract, left eye: Secondary | ICD-10-CM | POA: Diagnosis not present

## 2015-12-29 DIAGNOSIS — I10 Essential (primary) hypertension: Secondary | ICD-10-CM

## 2015-12-29 DIAGNOSIS — H353221 Exudative age-related macular degeneration, left eye, with active choroidal neovascularization: Secondary | ICD-10-CM | POA: Diagnosis not present

## 2015-12-29 DIAGNOSIS — D3131 Benign neoplasm of right choroid: Secondary | ICD-10-CM | POA: Diagnosis not present

## 2015-12-29 DIAGNOSIS — H25012 Cortical age-related cataract, left eye: Secondary | ICD-10-CM | POA: Diagnosis not present

## 2015-12-29 DIAGNOSIS — H353112 Nonexudative age-related macular degeneration, right eye, intermediate dry stage: Secondary | ICD-10-CM

## 2015-12-29 DIAGNOSIS — H40013 Open angle with borderline findings, low risk, bilateral: Secondary | ICD-10-CM | POA: Diagnosis not present

## 2015-12-29 DIAGNOSIS — H35033 Hypertensive retinopathy, bilateral: Secondary | ICD-10-CM | POA: Diagnosis not present

## 2016-01-26 ENCOUNTER — Encounter (INDEPENDENT_AMBULATORY_CARE_PROVIDER_SITE_OTHER): Payer: Medicare Other | Admitting: Ophthalmology

## 2016-01-26 DIAGNOSIS — H353112 Nonexudative age-related macular degeneration, right eye, intermediate dry stage: Secondary | ICD-10-CM

## 2016-01-26 DIAGNOSIS — H43813 Vitreous degeneration, bilateral: Secondary | ICD-10-CM

## 2016-01-26 DIAGNOSIS — I10 Essential (primary) hypertension: Secondary | ICD-10-CM | POA: Diagnosis not present

## 2016-01-26 DIAGNOSIS — H353221 Exudative age-related macular degeneration, left eye, with active choroidal neovascularization: Secondary | ICD-10-CM | POA: Diagnosis not present

## 2016-01-26 DIAGNOSIS — H35033 Hypertensive retinopathy, bilateral: Secondary | ICD-10-CM | POA: Diagnosis not present

## 2016-02-17 DIAGNOSIS — H2512 Age-related nuclear cataract, left eye: Secondary | ICD-10-CM | POA: Diagnosis not present

## 2016-02-23 ENCOUNTER — Encounter (INDEPENDENT_AMBULATORY_CARE_PROVIDER_SITE_OTHER): Payer: Medicare Other | Admitting: Ophthalmology

## 2016-02-23 DIAGNOSIS — H35033 Hypertensive retinopathy, bilateral: Secondary | ICD-10-CM

## 2016-02-23 DIAGNOSIS — I10 Essential (primary) hypertension: Secondary | ICD-10-CM

## 2016-02-23 DIAGNOSIS — D3131 Benign neoplasm of right choroid: Secondary | ICD-10-CM

## 2016-02-23 DIAGNOSIS — H353221 Exudative age-related macular degeneration, left eye, with active choroidal neovascularization: Secondary | ICD-10-CM | POA: Diagnosis not present

## 2016-02-23 DIAGNOSIS — H353112 Nonexudative age-related macular degeneration, right eye, intermediate dry stage: Secondary | ICD-10-CM | POA: Diagnosis not present

## 2016-02-23 DIAGNOSIS — H43813 Vitreous degeneration, bilateral: Secondary | ICD-10-CM

## 2016-02-27 DIAGNOSIS — H2512 Age-related nuclear cataract, left eye: Secondary | ICD-10-CM | POA: Diagnosis not present

## 2016-03-31 ENCOUNTER — Encounter (INDEPENDENT_AMBULATORY_CARE_PROVIDER_SITE_OTHER): Payer: Medicare Other | Admitting: Ophthalmology

## 2016-03-31 DIAGNOSIS — D3131 Benign neoplasm of right choroid: Secondary | ICD-10-CM

## 2016-03-31 DIAGNOSIS — H353112 Nonexudative age-related macular degeneration, right eye, intermediate dry stage: Secondary | ICD-10-CM | POA: Diagnosis not present

## 2016-03-31 DIAGNOSIS — I10 Essential (primary) hypertension: Secondary | ICD-10-CM | POA: Diagnosis not present

## 2016-03-31 DIAGNOSIS — H43813 Vitreous degeneration, bilateral: Secondary | ICD-10-CM | POA: Diagnosis not present

## 2016-03-31 DIAGNOSIS — H353221 Exudative age-related macular degeneration, left eye, with active choroidal neovascularization: Secondary | ICD-10-CM

## 2016-03-31 DIAGNOSIS — H35033 Hypertensive retinopathy, bilateral: Secondary | ICD-10-CM | POA: Diagnosis not present

## 2016-05-01 ENCOUNTER — Telehealth: Payer: Self-pay | Admitting: Family Medicine

## 2016-05-01 NOTE — Telephone Encounter (Signed)
Please contact patient It's been more than six months since her last visit Find out if she's staying at [facility], and if so, she'll want to contact them to get scheduled If she is going to come here to [place], please ask her to schedule an appointment in the next several weeks and we'll get fasting labs then too Thank you

## 2016-05-03 MED ORDER — CLOPIDOGREL BISULFATE 75 MG PO TABS: 75.0000 mg | ORAL_TABLET | Freq: Every day | ORAL | 1 refills | Status: DC

## 2016-05-03 MED ORDER — TRIAMTERENE-HCTZ 37.5-25 MG PO TABS: 1.0000 | ORAL_TABLET | Freq: Every day | ORAL | 1 refills | Status: DC

## 2016-05-03 NOTE — Telephone Encounter (Signed)
We look forward to seeing her; Rxs sent as requested

## 2016-05-06 ENCOUNTER — Encounter (INDEPENDENT_AMBULATORY_CARE_PROVIDER_SITE_OTHER): Payer: Medicare Other | Admitting: Ophthalmology

## 2016-05-06 DIAGNOSIS — D3131 Benign neoplasm of right choroid: Secondary | ICD-10-CM | POA: Diagnosis not present

## 2016-05-06 DIAGNOSIS — H353112 Nonexudative age-related macular degeneration, right eye, intermediate dry stage: Secondary | ICD-10-CM

## 2016-05-06 DIAGNOSIS — H35033 Hypertensive retinopathy, bilateral: Secondary | ICD-10-CM | POA: Diagnosis not present

## 2016-05-06 DIAGNOSIS — H353221 Exudative age-related macular degeneration, left eye, with active choroidal neovascularization: Secondary | ICD-10-CM

## 2016-05-06 DIAGNOSIS — H43813 Vitreous degeneration, bilateral: Secondary | ICD-10-CM | POA: Diagnosis not present

## 2016-05-06 DIAGNOSIS — I10 Essential (primary) hypertension: Secondary | ICD-10-CM

## 2016-05-11 ENCOUNTER — Other Ambulatory Visit: Payer: Self-pay | Admitting: Family Medicine

## 2016-05-11 MED ORDER — ROSUVASTATIN CALCIUM 10 MG PO TABS: 10.0000 mg | ORAL_TABLET | Freq: Every day | ORAL | 0 refills | Status: DC

## 2016-05-11 NOTE — Telephone Encounter (Signed)
Please clarify two things: 1.  She's requesting simvastatin, but we have rosuvastatin on her list. Which one does she want and why? 2. Her appt is with Dr. Ancil Boozer. Is that correct? If she's switching, please make sure okay with Dr. Ancil Boozer. If just wrong box got clicked, please move her to my schedule. Thanks!

## 2016-05-11 NOTE — Telephone Encounter (Signed)
Pt got confused she is on rosuvastatin. Appt has also been switched to you.

## 2016-05-11 NOTE — Telephone Encounter (Signed)
Thank you :)

## 2016-05-18 ENCOUNTER — Ambulatory Visit: Payer: Medicare Other | Admitting: Family Medicine

## 2016-05-26 ENCOUNTER — Ambulatory Visit: Payer: Medicare Other | Admitting: Family Medicine

## 2016-06-02 ENCOUNTER — Encounter: Payer: Self-pay | Admitting: Family Medicine

## 2016-06-02 ENCOUNTER — Telehealth: Payer: Self-pay | Admitting: Family Medicine

## 2016-06-02 NOTE — Telephone Encounter (Signed)
Yes, I'll be glad to write her a letter for this; will be ready in five minutes

## 2016-06-02 NOTE — Telephone Encounter (Signed)
Called daughter debbie back, she will call with fax #

## 2016-06-02 NOTE — Telephone Encounter (Signed)
Patients daughter Jackelyn Poling called stating she needs a letter for the postal service stating her mom has PAD and its hard for her to walk. Daughter is trying to get her mothers mailbox moved to her side of the road so she does not have to cross the road. Daughter states it is ok to leave a message if she is unable to answer. Please advise.

## 2016-06-09 ENCOUNTER — Telehealth: Payer: Self-pay | Admitting: Family Medicine

## 2016-06-09 NOTE — Telephone Encounter (Signed)
Pt has an appt next week and will be out of Triamterene and Clopidogret to [employer].

## 2016-06-09 NOTE — Telephone Encounter (Signed)
Called pharmacy and notified.

## 2016-06-09 NOTE — Telephone Encounter (Signed)
Please verify with pharmacy I sent in a 90 day supply of both of those on 05/03/16, so she should have enough until after Thanksgiving Thank you

## 2016-06-17 ENCOUNTER — Encounter (INDEPENDENT_AMBULATORY_CARE_PROVIDER_SITE_OTHER): Payer: Medicare Other | Admitting: Ophthalmology

## 2016-06-17 DIAGNOSIS — D3131 Benign neoplasm of right choroid: Secondary | ICD-10-CM | POA: Diagnosis not present

## 2016-06-17 DIAGNOSIS — H353221 Exudative age-related macular degeneration, left eye, with active choroidal neovascularization: Secondary | ICD-10-CM

## 2016-06-17 DIAGNOSIS — H353112 Nonexudative age-related macular degeneration, right eye, intermediate dry stage: Secondary | ICD-10-CM | POA: Diagnosis not present

## 2016-06-17 DIAGNOSIS — H35033 Hypertensive retinopathy, bilateral: Secondary | ICD-10-CM

## 2016-06-17 DIAGNOSIS — H43813 Vitreous degeneration, bilateral: Secondary | ICD-10-CM | POA: Diagnosis not present

## 2016-06-17 DIAGNOSIS — I10 Essential (primary) hypertension: Secondary | ICD-10-CM

## 2016-06-18 ENCOUNTER — Ambulatory Visit (INDEPENDENT_AMBULATORY_CARE_PROVIDER_SITE_OTHER): Payer: Medicare Other | Admitting: Family Medicine

## 2016-06-18 ENCOUNTER — Encounter: Payer: Self-pay | Admitting: Family Medicine

## 2016-06-18 VITALS — BP 148/90 | HR 80 | Temp 99.2°F | Resp 16 | Ht 60.0 in | Wt 161.0 lb

## 2016-06-18 DIAGNOSIS — R7301 Impaired fasting glucose: Secondary | ICD-10-CM | POA: Diagnosis not present

## 2016-06-18 DIAGNOSIS — Z6831 Body mass index (BMI) 31.0-31.9, adult: Secondary | ICD-10-CM | POA: Diagnosis not present

## 2016-06-18 DIAGNOSIS — E782 Mixed hyperlipidemia: Secondary | ICD-10-CM | POA: Diagnosis not present

## 2016-06-18 DIAGNOSIS — N183 Chronic kidney disease, stage 3 unspecified: Secondary | ICD-10-CM

## 2016-06-18 DIAGNOSIS — E6609 Other obesity due to excess calories: Secondary | ICD-10-CM | POA: Diagnosis not present

## 2016-06-18 DIAGNOSIS — I701 Atherosclerosis of renal artery: Secondary | ICD-10-CM

## 2016-06-18 DIAGNOSIS — I10 Essential (primary) hypertension: Secondary | ICD-10-CM | POA: Diagnosis not present

## 2016-06-18 DIAGNOSIS — R809 Proteinuria, unspecified: Secondary | ICD-10-CM | POA: Diagnosis not present

## 2016-06-18 DIAGNOSIS — I350 Nonrheumatic aortic (valve) stenosis: Secondary | ICD-10-CM

## 2016-06-18 DIAGNOSIS — I2510 Atherosclerotic heart disease of native coronary artery without angina pectoris: Secondary | ICD-10-CM | POA: Diagnosis not present

## 2016-06-18 DIAGNOSIS — Z5181 Encounter for therapeutic drug level monitoring: Secondary | ICD-10-CM | POA: Diagnosis not present

## 2016-06-18 LAB — CBC WITH DIFFERENTIAL/PLATELET
BASOS ABS: 0 {cells}/uL (ref 0–200)
Basophils Relative: 0 %
EOS ABS: 50 {cells}/uL (ref 15–500)
Eosinophils Relative: 1 %
HCT: 38.1 % (ref 35.0–45.0)
HEMOGLOBIN: 12.4 g/dL (ref 11.7–15.5)
LYMPHS ABS: 800 {cells}/uL — AB (ref 850–3900)
Lymphocytes Relative: 16 %
MCH: 27 pg (ref 27.0–33.0)
MCHC: 32.5 g/dL (ref 32.0–36.0)
MCV: 83 fL (ref 80.0–100.0)
MPV: 10.2 fL (ref 7.5–12.5)
Monocytes Absolute: 500 cells/uL (ref 200–950)
Monocytes Relative: 10 %
NEUTROS ABS: 3650 {cells}/uL (ref 1500–7800)
NEUTROS PCT: 73 %
Platelets: 221 10*3/uL (ref 140–400)
RBC: 4.59 MIL/uL (ref 3.80–5.10)
RDW: 16.2 % — ABNORMAL HIGH (ref 11.0–15.0)
WBC: 5 10*3/uL (ref 3.8–10.8)

## 2016-06-18 LAB — COMPLETE METABOLIC PANEL WITH GFR
ALT: 15 U/L (ref 6–29)
AST: 23 U/L (ref 10–35)
Albumin: 3.9 g/dL (ref 3.6–5.1)
Alkaline Phosphatase: 65 U/L (ref 33–130)
BILIRUBIN TOTAL: 0.4 mg/dL (ref 0.2–1.2)
BUN: 13 mg/dL (ref 7–25)
CO2: 27 mmol/L (ref 20–31)
Calcium: 9.2 mg/dL (ref 8.6–10.4)
Chloride: 101 mmol/L (ref 98–110)
Creat: 0.9 mg/dL (ref 0.60–0.93)
GFR, EST NON AFRICAN AMERICAN: 61 mL/min (ref >=60)
GFR, Est African American: 70 mL/min (ref >=60)
GLUCOSE: 102 mg/dL — AB (ref 65–99)
Potassium: 4 mmol/L (ref 3.5–5.3)
SODIUM: 141 mmol/L (ref 135–146)
TOTAL PROTEIN: 6.6 g/dL (ref 6.1–8.1)

## 2016-06-18 LAB — LIPID PANEL
CHOLESTEROL: 206 mg/dL — AB (ref 125–200)
HDL: 54 mg/dL (ref >=46)
LDL Cholesterol: 120 mg/dL (ref <130)
TRIGLYCERIDES: 158 mg/dL — AB (ref <150)
Total CHOL/HDL Ratio: 3.8 Ratio (ref <=5.0)
VLDL: 32 mg/dL — ABNORMAL HIGH (ref <30)

## 2016-06-18 LAB — HEMOGLOBIN A1C
Hgb A1c MFr Bld: 5.6 % (ref <5.7)
MEAN PLASMA GLUCOSE: 114 mg/dL

## 2016-06-18 NOTE — Assessment & Plan Note (Signed)
Encouraged patient to see her heart doctor

## 2016-06-18 NOTE — Assessment & Plan Note (Signed)
Avoid NSAIDs 

## 2016-06-18 NOTE — Patient Instructions (Signed)
Please do see Dr. Ubaldo Glassing soon to follow-up on your heart issues We'll get labs today Your goal blood pressure is less than 140 mmHg on top. Try to follow the DASH guidelines (DASH stands for Dietary Approaches to Stop Hypertension) Try to limit the sodium in your diet.  Ideally, consume less than 1.5 grams (less than 1,500mg ) per day. Do not add salt when cooking or at the table.  Check the sodium amount on labels when shopping, and choose items lower in sodium when given a choice. Avoid or limit foods that already contain a lot of sodium. Eat a diet rich in fruits and vegetables and whole grains. Try to limit saturated fats in your diet (bologna, hot dogs, barbeque, cheeseburgers, hamburgers, steak, bacon, sausage, cheese, etc.) and get more fresh fruits, vegetables, and whole grains

## 2016-06-18 NOTE — Assessment & Plan Note (Signed)
Check today 

## 2016-06-18 NOTE — Assessment & Plan Note (Signed)
Work on modest weight loss 

## 2016-06-18 NOTE — Assessment & Plan Note (Signed)
Check labs 

## 2016-06-18 NOTE — Progress Notes (Signed)
BP (!) 148/90 (BP Location: Right Arm, Patient Position: Sitting, Cuff Size: Normal)   Pulse 80   Temp 99.2 F (37.3 C) (Oral)   Resp 16   Ht 5' (1.524 m)   Wt 161 lb (73 kg)   SpO2 94%   BMI 31.44 kg/m    Subjective:    Patient ID: Angela Duffy, female    DOB: [birth year], 81 y.o.   MRN: [id number]  HPI: Angela Duffy is a 81 y.o. female  Chief Complaint  Patient presents with  . Medication Refill   Patient is here for f/u  She is getting injections in the eye; goes once a month for shots wet macular degeneration; has had seven shots already; they showed her films; was bleeding and now it looks like a brand new eye; now getting shots every 6 weeks Both cataracts removed; can see without her glasses except for fine printe  She has gained a little weight, up five pounds from Feb b/c medicine makes her hungry  She is fasting for her labs today  Hypertension; same medicines; the bottom number is up for her today; yesterday, it was 140 something right after the shot  Was seeing Dr. Ronalee Belts for her clogged arteries, and he wanted to do another surgery but she wouldn't let him; she has a stent in right iliac, then had one put in renal artery; he wanted to do that again  She has aortic stenosis; last visit with Dr. Ubaldo Glassing was two years ago; a little DOE; able to [place] her own yard  Prediabetes; avoids sugary drinks except for chocolate milk; no sweet tea or soft drinks  Stage 3 CKD; took an aleve three days ago from working out in the yard and had back pain; just three aleves in a whole year; knows it is not Duffy for her heart and bad for kidneys  Vitamin D deficiency; taking supplement  High cholesterol; had bacon and eggs and biscuits yesterday at [place] yesterday, but that was the first time in a while; does eat "wienies" once in a while; just a treat; getting whole grains  Depression screen [facility] 2/9 06/18/2016 04/24/2015  Decreased Interest 0 1  Down,  Depressed, Hopeless 0 1  PHQ - 2 Score 0 2   Relevant past medical, surgical, family and social history reviewed Past Medical History:  Diagnosis Date  . Aortic stenosis   . Atherosclerosis    on Chest CT 08/2014; 70-80% innominate artery, right subclavian artery, 75% celiac artery, 3 vessel coronary artery involvement  . CKD (chronic kidney disease) stage 3, GFR 30-59 ml/min   . Coronary atherosclerosis   . History of tobacco use   . Hyperlipidemia   . Hypertension   . IFG (impaired fasting glucose)   . Left ventricular diastolic dysfunction   . Left ventricular hypertrophy   . Mitral valve regurgitation   . Obesity   . PVD (peripheral vascular disease) ([place])   . Renal artery atherosclerosis (HCC)    greater than 60% stenosis right renal artery  . Tricuspid regurgitation   . Vitamin B12 deficiency   . Vitamin D deficiency disease    Past Surgical History:  Procedure Laterality Date  . CAROTID SURGERY  Feb 2004  . CERVICAL DISCECTOMY  1970s  . ILIAC ARTERY STENT  2003  . KIDNEY STENT  April 2016  . LAPAROSCOPY  1970s   Family History  Problem Relation Age of Onset  . Cancer Mother  colon  . Emphysema Father   . Heart disease Father   . Cancer Sister 26    breast  . Multiple sclerosis Brother   . Hypertension Sister    Social History  Substance Use Topics  . Smoking status: Former Smoker    Types: Cigarettes    Quit date: 04/14/2014  . Smokeless tobacco: Never Used  . Alcohol use No   Interim medical history since last visit reviewed. Allergies and medications reviewed  Review of Systems  Respiratory: Positive for shortness of breath.   Cardiovascular: Negative for chest pain.   Per HPI unless specifically indicated above     Objective:    BP (!) 148/90 (BP Location: Right Arm, Patient Position: Sitting, Cuff Size: Normal)   Pulse 80   Temp 99.2 F (37.3 C) (Oral)   Resp 16   Ht 5' (1.524 m)   Wt 161 lb (73 kg)   SpO2 94%   BMI 31.44 kg/m     Wt Readings from Last 3 Encounters:  06/18/16 161 lb (73 kg)  10/23/15 156 lb 3.2 oz (70.9 kg)  09/25/15 157 lb 9.6 oz (71.5 kg)    Physical Exam  Constitutional: She appears well-developed and well-nourished. No distress.  obese  HENT:  Head: Normocephalic and atraumatic.  Eyes: EOM are normal. No scleral icterus.  Neck: Carotid bruit is not present. No thyromegaly present.  Surgical scar left side of neck  Cardiovascular: Normal rate, regular rhythm and normal heart sounds.   No murmur heard. Pulmonary/Chest: Effort normal and breath sounds normal. No respiratory distress. She has no wheezes.  Abdominal: Soft. Bowel sounds are normal. She exhibits no distension.  Musculoskeletal: Normal range of motion. She exhibits no edema.  Neurological: She is alert. She exhibits normal muscle tone.  Skin: Skin is warm and dry. She is not diaphoretic. No pallor.  Bandages over right forearm; several ecchymoses on arms  Psychiatric: She has a normal mood and affect. Her behavior is normal. Judgment and thought content normal. Her mood appears not anxious. Cognition and memory are not impaired. She does not exhibit a depressed mood.   Results for orders placed or performed in visit on 09/25/15  Hgb A1c w/o eAG  Result Value Ref Range   Hgb A1c MFr Bld 5.9 (H) 4.8 - 5.6 %  Lipid Panel w/o Chol/HDL Ratio  Result Value Ref Range   Cholesterol, Total 218 (H) 100 - 199 mg/dL   Triglycerides 157 (H) 0 - 149 mg/dL   HDL 50 >39 mg/dL   VLDL Cholesterol Cal 31 5 - 40 mg/dL   LDL Calculated 137 (H) 0 - 99 mg/dL  CBC with Differential/Platelet  Result Value Ref Range   WBC 6.3 3.4 - 10.8 x10E3/uL   RBC 4.24 3.77 - 5.28 x10E6/uL   Hemoglobin 11.1 11.1 - 15.9 g/dL   Hematocrit 34.7 34.0 - 46.6 %   MCV 82 79 - 97 fL   MCH 26.2 (L) 26.6 - 33.0 pg   MCHC 32.0 31.5 - 35.7 g/dL   RDW 16.8 (H) 12.3 - 15.4 %   Platelets 258 150 - 379 x10E3/uL   Neutrophils 78 %   Lymphs 13 %   Monocytes 8 %   Eos 1  %   Basos 0 %   Neutrophils Absolute 4.9 1.4 - 7.0 x10E3/uL   Lymphocytes Absolute 0.8 0.7 - 3.1 x10E3/uL   Monocytes Absolute 0.5 0.1 - 0.9 x10E3/uL   EOS (ABSOLUTE) 0.1 0.0 - 0.4 x10E3/uL  Basophils Absolute 0.0 0.0 - 0.2 x10E3/uL   Immature Granulocytes 0 %   Immature Grans (Abs) 0.0 0.0 - 0.1 x10E3/uL  Comprehensive metabolic panel  Result Value Ref Range   Glucose 96 65 - 99 mg/dL   BUN 12 8 - 27 mg/dL   Creatinine, Ser 0.79 0.57 - 1.00 mg/dL   GFR calc non Af Amer 72 >59 mL/min/1.73   GFR calc Af Amer 83 >59 mL/min/1.73   BUN/Creatinine Ratio 15 11 - 26   Sodium 142 134 - 144 mmol/L   Potassium 4.0 3.5 - 5.2 mmol/L   Chloride 98 96 - 106 mmol/L   CO2 25 18 - 29 mmol/L   Calcium 9.5 8.7 - 10.3 mg/dL   Total Protein 6.7 6.0 - 8.5 g/dL   Albumin 4.1 3.5 - 4.8 g/dL   Globulin, Total 2.6 1.5 - 4.5 g/dL   Albumin/Globulin Ratio 1.6 1.1 - 2.5   Bilirubin Total 0.3 0.0 - 1.2 mg/dL   Alkaline Phosphatase 74 39 - 117 IU/L   AST 26 0 - 40 IU/L   ALT 21 0 - 32 IU/L  Microalbumin, Urine Waived  Result Value Ref Range   Microalb, Ur Waived 150 (H) 0 - 19 mg/L   Creatinine, Urine Waived 50 10 - 300 mg/dL   Microalb/Creat Ratio >300 (H) <30 mg/g      Assessment & Plan:   Problem List Items Addressed This Visit      Cardiovascular and Mediastinum   Hypertension - Primary (Chronic)    Fair control, not ideal; she has been on several medications; controlling stress is important      Coronary atherosclerosis    Check lipids; get back in to see Dr. Ubaldo Glassing; continue aspirin      Aortic stenosis (Chronic)    Encouraged patient to see her heart doctor        Endocrine   IFG (impaired fasting glucose) (Chronic)    Avoid sugary drinks; check A1c      Relevant Orders   Hemoglobin A1c     Genitourinary   CKD (chronic kidney disease) stage 3, GFR 30-59 ml/min (Chronic)    Avoid NSAIDs        Other   Obesity    Work on modest weight loss      Microalbuminuria     Check today      Relevant Orders   Microalbumin / creatinine urine ratio   Medication monitoring encounter    Check labs      Relevant Orders   COMPLETE METABOLIC PANEL WITH GFR   CBC with Differential/Platelet   Hyperlipidemia    Check lipids; avoid saturated fats      Relevant Orders   Lipid panel    Other Visit Diagnoses   None.     Follow up plan: Return in about 6 months (around 12/17/2016) for fasting visit with labs, Dr. Sanda Klein.  An after-visit summary was printed and given to the patient at [place].  Please see the patient instructions which may contain other information and recommendations beyond what is mentioned above in the assessment and plan.  Meds ordered this encounter  Medications  . MAGNESIUM PO    Sig: Take by mouth.    Orders Placed This Encounter  Procedures  . Microalbumin / creatinine urine ratio  . Hemoglobin A1c  . COMPLETE METABOLIC PANEL WITH GFR  . CBC with Differential/Platelet  . Lipid panel

## 2016-06-18 NOTE — Assessment & Plan Note (Signed)
Avoid sugary drinks; check A1c

## 2016-06-18 NOTE — Assessment & Plan Note (Signed)
Check lipids; get back in to see Dr. Ubaldo Glassing; continue aspirin

## 2016-06-18 NOTE — Assessment & Plan Note (Signed)
Fair control, not ideal; she has been on several medications; controlling stress is important

## 2016-06-18 NOTE — Assessment & Plan Note (Signed)
Check lipids; avoid saturated fats 

## 2016-06-19 LAB — MICROALBUMIN / CREATININE URINE RATIO
Creatinine, Urine: 65 mg/dL (ref 20–320)
MICROALB UR: 12.2 mg/dL
Microalb Creat Ratio: 188 mcg/mg creat — ABNORMAL HIGH (ref <30)

## 2016-06-25 DIAGNOSIS — Z23 Encounter for immunization: Secondary | ICD-10-CM | POA: Diagnosis not present

## 2016-06-29 ENCOUNTER — Encounter: Payer: Self-pay | Admitting: Emergency Medicine

## 2016-06-29 ENCOUNTER — Emergency Department
Admission: EM | Admit: 2016-06-29 | Discharge: 2016-06-29 | Disposition: A | Discharge status: Home / Self Care | Payer: Medicare Other | Attending: Emergency Medicine | Admitting: Emergency Medicine

## 2016-06-29 ENCOUNTER — Emergency Department: Payer: Medicare Other

## 2016-06-29 DIAGNOSIS — Y999 Unspecified external cause status: Secondary | ICD-10-CM | POA: Insufficient documentation

## 2016-06-29 DIAGNOSIS — S0990XA Unspecified injury of head, initial encounter: Secondary | ICD-10-CM | POA: Diagnosis not present

## 2016-06-29 DIAGNOSIS — Z87891 Personal history of nicotine dependence: Secondary | ICD-10-CM | POA: Insufficient documentation

## 2016-06-29 DIAGNOSIS — Z79899 Other long term (current) drug therapy: Secondary | ICD-10-CM | POA: Diagnosis not present

## 2016-06-29 DIAGNOSIS — N183 Chronic kidney disease, stage 3 (moderate): Secondary | ICD-10-CM | POA: Diagnosis not present

## 2016-06-29 DIAGNOSIS — Z7982 Long term (current) use of aspirin: Secondary | ICD-10-CM | POA: Diagnosis not present

## 2016-06-29 DIAGNOSIS — Y9389 Activity, other specified: Secondary | ICD-10-CM | POA: Diagnosis not present

## 2016-06-29 DIAGNOSIS — I129 Hypertensive chronic kidney disease with stage 1 through stage 4 chronic kidney disease, or unspecified chronic kidney disease: Secondary | ICD-10-CM | POA: Insufficient documentation

## 2016-06-29 DIAGNOSIS — W01198A Fall on same level from slipping, tripping and stumbling with subsequent striking against other object, initial encounter: Secondary | ICD-10-CM | POA: Insufficient documentation

## 2016-06-29 DIAGNOSIS — W19XXXA Unspecified fall, initial encounter: Secondary | ICD-10-CM | POA: Diagnosis not present

## 2016-06-29 DIAGNOSIS — Y9289 Other specified places as the place of occurrence of the external cause: Secondary | ICD-10-CM | POA: Diagnosis not present

## 2016-06-29 DIAGNOSIS — I2510 Atherosclerotic heart disease of native coronary artery without angina pectoris: Secondary | ICD-10-CM | POA: Insufficient documentation

## 2016-06-29 DIAGNOSIS — S0101XA Laceration without foreign body of scalp, initial encounter: Secondary | ICD-10-CM

## 2016-06-29 LAB — CBC
HCT: 37.6 % (ref 35.0–47.0)
Hemoglobin: 12.6 g/dL (ref 12.0–16.0)
MCH: 27.6 pg (ref 26.0–34.0)
MCHC: 33.4 g/dL (ref 32.0–36.0)
MCV: 82.6 fL (ref 80.0–100.0)
Platelets: 166 10*3/uL (ref 150–440)
RBC: 4.55 MIL/uL (ref 3.80–5.20)
RDW: 16 % — AB (ref 11.5–14.5)
WBC: 15.5 10*3/uL — ABNORMAL HIGH (ref 3.6–11.0)

## 2016-06-29 LAB — BASIC METABOLIC PANEL
ANION GAP: 10 (ref 5–15)
BUN: 17 mg/dL (ref 6–20)
CHLORIDE: 100 mmol/L — AB (ref 101–111)
CO2: 26 mmol/L (ref 22–32)
CREATININE: 0.81 mg/dL (ref 0.44–1.00)
Calcium: 9.1 mg/dL (ref 8.9–10.3)
GFR calc non Af Amer: >60 mL/min (ref >60)
Glucose, Bld: 116 mg/dL — ABNORMAL HIGH (ref 65–99)
POTASSIUM: 3.2 mmol/L — AB (ref 3.5–5.1)
SODIUM: 136 mmol/L (ref 135–145)

## 2016-06-29 MED ORDER — LIDOCAINE-EPINEPHRINE (PF) 1 %-1:200000 IJ SOLN
INTRAMUSCULAR | Status: AC
  Filled 2016-06-29: qty 30

## 2016-06-29 NOTE — ED Triage Notes (Signed)
Pt in via EMS from home; pt outside when she tripped, hitting head on metal pole of carport.  Pt denies LOC, denies any pain at this time, denies any other associating symptoms. EMS reports significant blood loss with "blood squirting" initially; EMS reports bleeding controlled approximately while in route.  Pt ambulatory on scene.  Pt with laceration to left lateral head, unable to identify approximate size at this time due to area of dried blood.  Pt A/Ox4, vitals WDL, no immediate distress at this time.

## 2016-06-29 NOTE — ED Provider Notes (Addendum)
[facility] Emergency Department Provider Note  Time seen: 3:24 PM  I have reviewed the triage vital signs and the nursing notes.   HISTORY  Chief Complaint Fall and Head Injury    HPI Angela Duffy is a 81 y.o. female with a past medical history of CK D, hyperlipidemia, hypertension who presents to the emergency department after a fall. According to the patient she bent over stumbled forward and then hit her head on a metal support post. Patient denies LOC. States it didn't even hurt that bad however when she stood up she felt blood running down her neck. EMS states significant arterial bleed however it resolved holding pressure. Upon arrival to began rebleeding. Patient denies any pain or headache. Denies any lightheadedness or dizziness. States she feels well. The patient does currently take aspirin and Plavix.  Past Medical History:  Diagnosis Date  . Aortic stenosis   . Atherosclerosis    on Chest CT 08/2014; 70-80% innominate artery, right subclavian artery, 75% celiac artery, 3 vessel coronary artery involvement  . CKD (chronic kidney disease) stage 3, GFR 30-59 ml/min   . Coronary atherosclerosis   . History of tobacco use   . Hyperlipidemia   . Hypertension   . IFG (impaired fasting glucose)   . Left ventricular diastolic dysfunction   . Left ventricular hypertrophy   . Mitral valve regurgitation   . Obesity   . PVD (peripheral vascular disease) ([place])   . Renal artery atherosclerosis (HCC)    greater than 60% stenosis right renal artery  . Tricuspid regurgitation   . Vitamin B12 deficiency   . Vitamin D deficiency disease     Patient Active Problem List   Diagnosis Date Noted  . Medication monitoring encounter 09/25/2015  . Bursitis of left shoulder 07/26/2015  . Microalbuminuria 04/24/2015  . IFG (impaired fasting glucose)   . Hyperlipidemia   . Hypertension   . Obesity   . PVD (peripheral vascular disease) ([place])   . History of  tobacco use   . CKD (chronic kidney disease) stage 3, GFR 30-59 ml/min   . Vitamin D deficiency disease   . Aortic stenosis   . Left ventricular diastolic dysfunction   . Left ventricular hypertrophy   . Mitral valve regurgitation   . Tricuspid regurgitation   . Coronary atherosclerosis   . Atherosclerosis   . Renal artery atherosclerosis [facility])     Past Surgical History:  Procedure Laterality Date  . CAROTID SURGERY  Feb 2004  . CERVICAL DISCECTOMY  1970s  . ILIAC ARTERY STENT  2003  . KIDNEY STENT  April 2016  . LAPAROSCOPY  1970s    Prior to Admission medications   Medication Sig Start Date End Date Taking? Authorizing Provider  aspirin EC 81 MG tablet Take 81 mg by mouth daily.    Historical Provider, MD  Cholecalciferol (VITAMIN D3) 2000 UNITS TABS Take 2,000 Units by mouth daily.    Historical Provider, MD  clopidogrel (PLAVIX) 75 MG tablet Take 1 tablet (75 mg total) by mouth daily. 05/03/16   Arnetha Courser, MD  Cyanocobalamin (VITAMIN B-12) 1000 MCG SUBL Place under the tongue daily.     Historical Provider, MD  Magnesium 250 MG TABS Take 1 tablet (250 mg total) by mouth daily. 04/24/15   Arnetha Courser, MD  MAGNESIUM PO Take by mouth.    Historical Provider, MD  rosuvastatin (CRESTOR) 10 MG tablet Take 1 tablet (10 mg total) by mouth at bedtime. (  this replaces simvastatin) 05/11/16   Arnetha Courser, MD  triamterene-hydrochlorothiazide (MAXZIDE-25) 37.5-25 MG tablet Take 1 tablet by mouth daily. 05/03/16   Arnetha Courser, MD    Allergies  Allergen Reactions  . Cozaar [Losartan] Other (See Comments)    cramps  . Felodipine Swelling  . Lisinopril Other (See Comments)  . Metoprolol Swelling  . Sulfur Diarrhea and Nausea And Vomiting  . Neomycin Rash  . Nickel Rash  . Penicillins Rash    Family History  Problem Relation Age of Onset  . Cancer Mother     colon  . Emphysema Father   . Heart disease Father   . Cancer Sister 47    breast  . Multiple sclerosis Brother    . Hypertension Sister     Social History Social History  Substance Use Topics  . Smoking status: Former Smoker    Types: Cigarettes    Quit date: 04/14/2014  . Smokeless tobacco: Never Used  . Alcohol use No    Review of Systems Constitutional: Negative for fever. Cardiovascular: Negative for chest pain. Respiratory: Negative for shortness of breath. Gastrointestinal: Negative for abdominal pain Skin: Left scalp laceration 10-point ROS otherwise negative.  ____________________________________________   PHYSICAL EXAM:  VITAL SIGNS: ED Triage Vitals [06/29/16 1358]  Enc Vitals Group     BP (!) 158/61     Pulse Rate 85     Resp 19     Temp 98.5 F (36.9 C)     Temp Source Oral     SpO2 97 %     Weight 158 lb (71.7 kg)     Height 5' (1.524 m)     Head Circumference      Peak Flow      Pain Score      Pain Loc      Pain Edu?      Excl. in [place]?     Constitutional: Alert and oriented. Well appearing and in no distress. Eyes: Normal exam ENT   Head: Patient has an approximate 2.5 cm laceration to the left parietal scalp with a small arterial bleed.   Mouth/Throat: Mucous membranes are moist. Cardiovascular: Normal rate, regular rhythm.  Respiratory: Normal respiratory effort without tachypnea nor retractions. Breath sounds are clear  Gastrointestinal: Soft and nontender. No distention.   Musculoskeletal: Nontender with normal range of motion in all extremities. Atraumatic extremities. Neurologic:  Normal speech and language. No gross focal neurologic deficits  Skin:  Skin is warm, dry and intact.  Psychiatric: Mood and affect are normal.   ____________________________________________   CT scan of the head shows no acute intracranial abnormality. Left-sided scalp hematoma.   INITIAL IMPRESSION / ASSESSMENT AND PLAN / ED COURSE  Pertinent labs & imaging results that were available during my care of the patient were reviewed by me and considered in my  medical decision making (see chart for details).  The patient presents to the emergency department after a head injury. Denies LOC. Denies nausea or vomiting. Denies headache focal weakness or numbness. Patient does state the only reason she came in was because the bleeding would not stop at home. Patient has a small pulsatile bleed from the laceration. Direct pressure was applied, patient injected with lidocaine with epinephrine and became hemostatic. A thin single layer of Surgicel was placed in the laceration. Patient remained hemostatic for approximately 10 minutes at which time staples were placed. Patient remains hemostatic in the emergency department. CT scan of the head is negative for  intracranial abnormality. We will check basic labs. His lungs basically labs are within normal limits patient will be discharged home with PCP follow-up in 7 days for staple removal. Patient agreeable to plan.   LACERATION REPAIR Performed by: Harvest Dark Authorized by: Harvest Dark Consent: Verbal consent obtained. Risks and benefits: risks, benefits and alternatives were discussed Consent given by: patient Patient identity confirmed: provided demographic data Prepped and Draped in normal sterile fashion Wound explored  Laceration Location: left parietal scalp   Laceration Length: 2.5cm  No Foreign Bodies seen or palpated  Anesthesia: local infiltration  Local anesthetic: lidocaine 1% with epinephrine  Anesthetic total: 8 ml  Irrigation method: syringe Amount of cleaning: standard  Skin closure: staples  Number of staples: 3  Patient tolerance: Patient tolerated the procedure well with no immediate complications.   ____________________________________________   FINAL CLINICAL IMPRESSION(S) / ED DIAGNOSES  Scalp laceration Head injury    Harvest Dark, MD 06/29/16 [place], MD 06/29/16 1537

## 2016-06-29 NOTE — ED Notes (Signed)
MD to bedside at this time to suture laceration to head.

## 2016-06-29 NOTE — Discharge Instructions (Signed)
You may wash her scalp, gently with warm water and soap. Please allow to air dry do not rub. Please follow-up with your doctor or an urgent care in 7-10 days for staple removal. Return to the emergency department for any significant bleeding, lightheadedness or dizziness.

## 2016-06-29 NOTE — ED Notes (Signed)
Patient transported to CT 

## 2016-07-07 ENCOUNTER — Emergency Department
Admission: EM | Admit: 2016-07-07 | Discharge: 2016-07-07 | Disposition: A | Discharge status: Home / Self Care | Payer: Medicare Other | Attending: Emergency Medicine | Admitting: Emergency Medicine

## 2016-07-07 DIAGNOSIS — Z87891 Personal history of nicotine dependence: Secondary | ICD-10-CM | POA: Diagnosis not present

## 2016-07-07 DIAGNOSIS — Z7982 Long term (current) use of aspirin: Secondary | ICD-10-CM | POA: Insufficient documentation

## 2016-07-07 DIAGNOSIS — N183 Chronic kidney disease, stage 3 (moderate): Secondary | ICD-10-CM | POA: Insufficient documentation

## 2016-07-07 DIAGNOSIS — I129 Hypertensive chronic kidney disease with stage 1 through stage 4 chronic kidney disease, or unspecified chronic kidney disease: Secondary | ICD-10-CM | POA: Insufficient documentation

## 2016-07-07 DIAGNOSIS — Z4802 Encounter for removal of sutures: Secondary | ICD-10-CM

## 2016-07-07 NOTE — Discharge Instructions (Signed)
Continue to keep area clean and dry. Follow-up with her primary care doctor if any continued problems.

## 2016-07-07 NOTE — ED Provider Notes (Signed)
[facility] Emergency Department Provider Note  ____________________________________________   First MD Initiated Contact with Patient 07/07/16 1223     (approximate)  I have reviewed the triage vital signs and the nursing notes.   HISTORY  Chief Complaint Suture / Staple Removal   HPI Angela Duffy is a 81 y.o. female is here to have 3 staples removed. Patient states that she was in the emergency room for laceration to her scalp one week ago. She denies any problems at the staple site other than it is sore. She denies any fever or chills and has had noted drainage from the site. She denies any pain.   Past Medical History:  Diagnosis Date  . Aortic stenosis   . Atherosclerosis    on Chest CT 08/2014; 70-80% innominate artery, right subclavian artery, 75% celiac artery, 3 vessel coronary artery involvement  . CKD (chronic kidney disease) stage 3, GFR 30-59 ml/min   . Coronary atherosclerosis   . History of tobacco use   . Hyperlipidemia   . Hypertension   . IFG (impaired fasting glucose)   . Left ventricular diastolic dysfunction   . Left ventricular hypertrophy   . Mitral valve regurgitation   . Obesity   . PVD (peripheral vascular disease) ([place])   . Renal artery atherosclerosis (HCC)    greater than 60% stenosis right renal artery  . Tricuspid regurgitation   . Vitamin B12 deficiency   . Vitamin D deficiency disease     Patient Active Problem List   Diagnosis Date Noted  . Medication monitoring encounter 09/25/2015  . Bursitis of left shoulder 07/26/2015  . Microalbuminuria 04/24/2015  . IFG (impaired fasting glucose)   . Hyperlipidemia   . Hypertension   . Obesity   . PVD (peripheral vascular disease) ([place])   . History of tobacco use   . CKD (chronic kidney disease) stage 3, GFR 30-59 ml/min   . Vitamin D deficiency disease   . Aortic stenosis   . Left ventricular diastolic dysfunction   . Left ventricular hypertrophy   .  Mitral valve regurgitation   . Tricuspid regurgitation   . Coronary atherosclerosis   . Atherosclerosis   . Renal artery atherosclerosis [facility])     Past Surgical History:  Procedure Laterality Date  . CAROTID SURGERY  Feb 2004  . CERVICAL DISCECTOMY  1970s  . ILIAC ARTERY STENT  2003  . KIDNEY STENT  April 2016  . LAPAROSCOPY  1970s    Prior to Admission medications   Medication Sig Start Date End Date Taking? Authorizing Provider  aspirin EC 81 MG tablet Take 81 mg by mouth daily.    Historical Provider, MD  Cholecalciferol (VITAMIN D3) 2000 UNITS TABS Take 2,000 Units by mouth daily.    Historical Provider, MD  clopidogrel (PLAVIX) 75 MG tablet Take 1 tablet (75 mg total) by mouth daily. 05/03/16   Arnetha Courser, MD  Cyanocobalamin (VITAMIN B-12) 1000 MCG SUBL Place under the tongue daily.     Historical Provider, MD  Magnesium 250 MG TABS Take 1 tablet (250 mg total) by mouth daily. 04/24/15   Arnetha Courser, MD  MAGNESIUM PO Take by mouth.    Historical Provider, MD  rosuvastatin (CRESTOR) 10 MG tablet Take 1 tablet (10 mg total) by mouth at bedtime. (this replaces simvastatin) 05/11/16   Arnetha Courser, MD  triamterene-hydrochlorothiazide (MAXZIDE-25) 37.5-25 MG tablet Take 1 tablet by mouth daily. 05/03/16   Arnetha Courser, MD  Allergies Cozaar [losartan]; Felodipine; Lisinopril; Metoprolol; Sulfur; Neomycin; Nickel; and Penicillins  Family History  Problem Relation Age of Onset  . Cancer Mother     colon  . Emphysema Father   . Heart disease Father   . Cancer Sister 34    breast  . Multiple sclerosis Brother   . Hypertension Sister     Social History Social History  Substance Use Topics  . Smoking status: Former Smoker    Types: Cigarettes    Quit date: 04/14/2014  . Smokeless tobacco: Never Used  . Alcohol use No    Review of Systems Constitutional: No fever/chills Eyes: No visual changes. Cardiovascular: Denies chest pain. Respiratory: Denies shortness of  breath. Gastrointestinal:   No nausea, no vomiting.   Skin: Staple area scalp.  Ecchymosis left lower eye area Neurological: Negative for headaches, focal weakness or numbness.  10-point ROS otherwise negative.  ____________________________________________   PHYSICAL EXAM:  VITAL SIGNS: ED Triage Vitals [07/07/16 1149]  Enc Vitals Group     BP (!) 158/57     Pulse Rate 87     Resp 16     Temp 98.2 F (36.8 C)     Temp Source Oral     SpO2 90 %     Weight      Height      Head Circumference      Peak Flow      Pain Score      Pain Loc      Pain Edu?      Excl. in [place]?     Constitutional: Alert and oriented. Well appearing and in no acute distress.Patient talkative. Eyes: Conjunctivae are normal. PERRL. EOMI. Head: Atraumatic. Nose: No congestion/rhinnorhea. Neck: No stridor.   Respiratory: Normal respiratory effort.   Musculoskeletal: Moves upper and lower extremities without any difficulty. Neurologic:  Normal speech and language. No gross focal neurologic deficits are appreciated. No gait instability. Skin:  Skin is warm, dry and intact. Stapled area healing without any signs of infection. Psychiatric: Mood and affect are normal. Speech and behavior are normal.  ____________________________________________   LABS (all labs ordered are listed, but only abnormal results are displayed)  Labs Reviewed - No data to display  PROCEDURES  Procedure(s) performed: None  Procedures  Critical Care performed: No  ____________________________________________   INITIAL IMPRESSION / ASSESSMENT AND PLAN / ED COURSE  Pertinent labs & imaging results that were available during my care of the patient were reviewed by me and considered in my medical decision making (see chart for details).    Clinical Course   Staples were removed.  ____________________________________________   FINAL CLINICAL IMPRESSION(S) / ED DIAGNOSES  Final diagnoses:  Encounter for  removal of staples      NEW MEDICATIONS STARTED DURING THIS VISIT:  Discharge Medication List as of 07/07/2016 12:27 PM       Note:  This document was prepared using Dragon voice recognition software and may include unintentional dictation errors.    Johnn Hai, PA-C 07/07/16 1639    Rudene Re, MD 07/08/16 2056

## 2016-07-07 NOTE — ED Triage Notes (Signed)
Pt is here for staple removal.

## 2016-07-07 NOTE — ED Notes (Signed)
Pt has 3 staples in left side of head after a fall, staples have been in 1 week, well approximated incision

## 2016-07-07 NOTE — ED Notes (Signed)
3 staples removed, incision well approximated, no drainage

## 2016-07-26 ENCOUNTER — Encounter (INDEPENDENT_AMBULATORY_CARE_PROVIDER_SITE_OTHER): Payer: Medicare Other | Admitting: Ophthalmology

## 2016-07-26 DIAGNOSIS — H35033 Hypertensive retinopathy, bilateral: Secondary | ICD-10-CM

## 2016-07-26 DIAGNOSIS — H43813 Vitreous degeneration, bilateral: Secondary | ICD-10-CM | POA: Diagnosis not present

## 2016-07-26 DIAGNOSIS — H353112 Nonexudative age-related macular degeneration, right eye, intermediate dry stage: Secondary | ICD-10-CM

## 2016-07-26 DIAGNOSIS — H353221 Exudative age-related macular degeneration, left eye, with active choroidal neovascularization: Secondary | ICD-10-CM

## 2016-07-26 DIAGNOSIS — I10 Essential (primary) hypertension: Secondary | ICD-10-CM

## 2016-07-26 DIAGNOSIS — D3131 Benign neoplasm of right choroid: Secondary | ICD-10-CM | POA: Diagnosis not present

## 2016-09-10 ENCOUNTER — Encounter (INDEPENDENT_AMBULATORY_CARE_PROVIDER_SITE_OTHER): Payer: Medicare Other | Admitting: Ophthalmology

## 2016-09-28 ENCOUNTER — Other Ambulatory Visit: Payer: Self-pay

## 2016-09-28 ENCOUNTER — Other Ambulatory Visit: Payer: Self-pay | Admitting: Family Medicine

## 2016-09-28 MED ORDER — ROSUVASTATIN CALCIUM 10 MG PO TABS: 10.0000 mg | ORAL_TABLET | Freq: Every day | ORAL | 2 refills | Status: DC

## 2016-09-29 ENCOUNTER — Telehealth: Payer: Self-pay | Admitting: Family Medicine

## 2016-09-29 DIAGNOSIS — E876 Hypokalemia: Secondary | ICD-10-CM | POA: Insufficient documentation

## 2016-09-29 DIAGNOSIS — E782 Mixed hyperlipidemia: Secondary | ICD-10-CM

## 2016-09-29 DIAGNOSIS — Z5181 Encounter for therapeutic drug level monitoring: Secondary | ICD-10-CM

## 2016-09-29 NOTE — Assessment & Plan Note (Signed)
Check labs 

## 2016-09-29 NOTE — Telephone Encounter (Signed)
Does patient need bloodwork before refill?

## 2016-09-29 NOTE — Telephone Encounter (Signed)
I refilled the Crestor yesterday, so hopefully that's at the pharmacy We usually give the new medicine a full 6 weeks to work before we look at labs, so she can come in after she's been on the Crestor for 6 weeks; I'll order labs Thank you

## 2016-09-29 NOTE — Assessment & Plan Note (Signed)
Noted on last check at hospital; recheck with other labs soon

## 2016-09-29 NOTE — Telephone Encounter (Signed)
PT SAID THAT THE DR CHANGED HER SYMBISTATAIN AND SHE CHANGED IT TO CRESTOR BUT ONLY DID IT FOR 30 DAYS.SHE NEEDS THIS REFILLED FOR 90 DAYS FOR IT IS WORKING FINE AND SHE HAS AN APPT IN April. PHARM IS [address] IN [place],

## 2016-09-29 NOTE — Telephone Encounter (Signed)
Patient notified

## 2016-10-04 NOTE — Telephone Encounter (Signed)
Request received for crestor; I just approved 30+2 on Jan 23rd Please resolve with pharmacy

## 2016-10-08 ENCOUNTER — Encounter (INDEPENDENT_AMBULATORY_CARE_PROVIDER_SITE_OTHER): Payer: Medicare Other | Admitting: Ophthalmology

## 2016-10-08 DIAGNOSIS — H353112 Nonexudative age-related macular degeneration, right eye, intermediate dry stage: Secondary | ICD-10-CM

## 2016-10-08 DIAGNOSIS — H353221 Exudative age-related macular degeneration, left eye, with active choroidal neovascularization: Secondary | ICD-10-CM | POA: Diagnosis not present

## 2016-10-08 DIAGNOSIS — D3131 Benign neoplasm of right choroid: Secondary | ICD-10-CM

## 2016-10-08 DIAGNOSIS — H43813 Vitreous degeneration, bilateral: Secondary | ICD-10-CM

## 2016-12-03 ENCOUNTER — Encounter (INDEPENDENT_AMBULATORY_CARE_PROVIDER_SITE_OTHER): Payer: Medicare Other | Admitting: Ophthalmology

## 2016-12-03 DIAGNOSIS — H43813 Vitreous degeneration, bilateral: Secondary | ICD-10-CM

## 2016-12-03 DIAGNOSIS — H35033 Hypertensive retinopathy, bilateral: Secondary | ICD-10-CM

## 2016-12-03 DIAGNOSIS — H353231 Exudative age-related macular degeneration, bilateral, with active choroidal neovascularization: Secondary | ICD-10-CM | POA: Diagnosis not present

## 2016-12-03 DIAGNOSIS — I10 Essential (primary) hypertension: Secondary | ICD-10-CM

## 2016-12-15 ENCOUNTER — Other Ambulatory Visit: Payer: Self-pay

## 2016-12-15 ENCOUNTER — Other Ambulatory Visit: Payer: Self-pay | Admitting: Family Medicine

## 2016-12-15 MED ORDER — CLOPIDOGREL BISULFATE 75 MG PO TABS: 75.0000 mg | ORAL_TABLET | Freq: Every day | ORAL | 3 refills | Status: DC

## 2016-12-15 NOTE — Telephone Encounter (Signed)
Pt needs a refill on Clopidogrel to be sent to [facility]. Pt does have a upcoming appt this month.

## 2016-12-28 ENCOUNTER — Ambulatory Visit (INDEPENDENT_AMBULATORY_CARE_PROVIDER_SITE_OTHER): Payer: Medicare Other | Admitting: Family Medicine

## 2016-12-28 ENCOUNTER — Encounter: Payer: Self-pay | Admitting: Family Medicine

## 2016-12-28 DIAGNOSIS — I10 Essential (primary) hypertension: Secondary | ICD-10-CM

## 2016-12-28 DIAGNOSIS — I2510 Atherosclerotic heart disease of native coronary artery without angina pectoris: Secondary | ICD-10-CM | POA: Diagnosis not present

## 2016-12-28 DIAGNOSIS — N183 Chronic kidney disease, stage 3 unspecified: Secondary | ICD-10-CM

## 2016-12-28 DIAGNOSIS — Z5181 Encounter for therapeutic drug level monitoring: Secondary | ICD-10-CM | POA: Diagnosis not present

## 2016-12-28 DIAGNOSIS — E559 Vitamin D deficiency, unspecified: Secondary | ICD-10-CM | POA: Diagnosis not present

## 2016-12-28 DIAGNOSIS — I7090 Unspecified atherosclerosis: Secondary | ICD-10-CM

## 2016-12-28 DIAGNOSIS — E782 Mixed hyperlipidemia: Secondary | ICD-10-CM | POA: Diagnosis not present

## 2016-12-28 DIAGNOSIS — Z6831 Body mass index (BMI) 31.0-31.9, adult: Secondary | ICD-10-CM | POA: Diagnosis not present

## 2016-12-28 DIAGNOSIS — E6609 Other obesity due to excess calories: Secondary | ICD-10-CM | POA: Diagnosis not present

## 2016-12-28 DIAGNOSIS — R7301 Impaired fasting glucose: Secondary | ICD-10-CM

## 2016-12-28 DIAGNOSIS — I350 Nonrheumatic aortic (valve) stenosis: Secondary | ICD-10-CM | POA: Diagnosis not present

## 2016-12-28 LAB — CBC WITH DIFFERENTIAL/PLATELET
Basophils Absolute: 0 cells/uL (ref 0–200)
Basophils Relative: 0 %
EOS PCT: 1 %
Eosinophils Absolute: 52 cells/uL (ref 15–500)
HCT: 39.6 % (ref 35.0–45.0)
HEMOGLOBIN: 12.8 g/dL (ref 11.7–15.5)
LYMPHS ABS: 832 {cells}/uL — AB (ref 850–3900)
Lymphocytes Relative: 16 %
MCH: 27.2 pg (ref 27.0–33.0)
MCHC: 32.3 g/dL (ref 32.0–36.0)
MCV: 84.1 fL (ref 80.0–100.0)
MONOS PCT: 9 %
MPV: 11.1 fL (ref 7.5–12.5)
Monocytes Absolute: 468 cells/uL (ref 200–950)
NEUTROS ABS: 3848 {cells}/uL (ref 1500–7800)
NEUTROS PCT: 74 %
PLATELETS: 223 10*3/uL (ref 140–400)
RBC: 4.71 MIL/uL (ref 3.80–5.10)
RDW: 16.7 % — ABNORMAL HIGH (ref 11.0–15.0)
WBC: 5.2 10*3/uL (ref 3.8–10.8)

## 2016-12-28 NOTE — Assessment & Plan Note (Signed)
Check labs, avoid saturated

## 2016-12-28 NOTE — Assessment & Plan Note (Signed)
Avoid NSAIDs, hydrate, check GFR and Cr

## 2016-12-28 NOTE — Assessment & Plan Note (Signed)
Check glucose and A1c 

## 2016-12-28 NOTE — Progress Notes (Signed)
BP (!) 152/68 (BP Location: Right Arm, Cuff Size: Large)   Pulse 89   Temp 99 F (37.2 C) (Oral)   Resp 16   Wt 159 lb 1.6 oz (72.2 kg)   SpO2 95%   BMI 31.07 kg/m    Subjective:    Patient ID: Angela Duffy, female    DOB: 01-04-1935, 82 y.o.   MRN: [id number]  HPI: Angela Duffy is a 82 y.o. female  Chief Complaint  Patient presents with  . Follow-up    6 month     HPI  She has multiple blockages; sees Dr. Ronalee Belts; she has not seen a vascular doctor for at least 6 months She has a stent in her iliac artery, had carotid surgery Left arm is losing some strength, but does not cold She sees her cardiologist, but not regularly; Dr. Ubaldo Glassing Has macular degeneration She does not want any DEXA scans, no point Not interested in mammograms any more, does not want colonoscopy; it is her choice, not theirs she says She is mowing her own yard Putting on some abdominal weight from the atorvastatin High cholesterol; avoiding fatty meats; started new green pill with spinach and eight different greens in it, drop it in 8 ounces of water and it fizzes and you drink it; it's patented and she's drinking it every day; ordered it off of QVC, can tell a difference in her energy level BP p a little today; issues with high pressure for years; she is pleased with today's readings and does not want medicine changed  Depression screen [facility] 2/9 12/28/2016 06/18/2016 04/24/2015  Decreased Interest 0 0 1  Down, Depressed, Hopeless 0 0 1  PHQ - 2 Score 0 0 2   Relevant past medical, surgical, family and social history reviewed Past Medical History:  Diagnosis Date  . Aortic stenosis   . Atherosclerosis    on Chest CT 08/2014; 70-80% innominate artery, right subclavian artery, 75% celiac artery, 3 vessel coronary artery involvement  . CKD (chronic kidney disease) stage 3, GFR 30-59 ml/min   . Coronary atherosclerosis   . History of tobacco use   . Hyperlipidemia   . Hypertension   .  IFG (impaired fasting glucose)   . Left ventricular diastolic dysfunction   . Left ventricular hypertrophy   . Mitral valve regurgitation   . Obesity   . PVD (peripheral vascular disease) ([facility])   . Renal artery atherosclerosis (HCC)    greater than 60% stenosis right renal artery  . Tricuspid regurgitation   . Vitamin B12 deficiency   . Vitamin D deficiency disease    Past Surgical History:  Procedure Laterality Date  . CAROTID SURGERY  Feb 2004  . CERVICAL DISCECTOMY  1970s  . ILIAC ARTERY STENT  2003  . KIDNEY STENT  April 2016  . LAPAROSCOPY  1970s   Family History  Problem Relation Age of Onset  . Cancer Mother     colon  . Emphysema Father   . Heart disease Father   . Cancer Sister 29    breast  . Multiple sclerosis Brother   . Hypertension Sister    Social History  Substance Use Topics  . Smoking status: Former Smoker    Types: Cigarettes    Quit date: 04/14/2014  . Smokeless tobacco: Never Used  . Alcohol use No   Interim medical history since last visit reviewed. Allergies and medications reviewed  Review of Systems Per HPI unless specifically indicated above  Objective:    BP (!) 152/68 (BP Location: Right Arm, Cuff Size: Large)   Pulse 89   Temp 99 F (37.2 C) (Oral)   Resp 16   Wt 159 lb 1.6 oz (72.2 kg)   SpO2 95%   BMI 31.07 kg/m   Wt Readings from Last 3 Encounters:  12/28/16 159 lb 1.6 oz (72.2 kg)  06/29/16 158 lb (71.7 kg)  06/18/16 161 lb (73 kg)    Physical Exam  Constitutional: She appears well-developed and well-nourished. No distress.  obese  HENT:  Head: Normocephalic and atraumatic.  Eyes: EOM are normal. No scleral icterus.  Neck: Carotid bruit is not present. No thyromegaly present.  Surgical scar left side of neck  Cardiovascular: Normal rate, regular rhythm and normal heart sounds.   No murmur heard. Pulmonary/Chest: Effort normal and breath sounds normal. No respiratory distress. She has no wheezes.  Abdominal:  Soft. Bowel sounds are normal. She exhibits no distension.  Musculoskeletal: Normal range of motion. She exhibits no edema.  Neurological: She is alert. She exhibits normal muscle tone.  Skin: Skin is warm and dry. She is not diaphoretic. No pallor.  Few ecchymoses on arms, extensor surfaces  Psychiatric: She has a normal mood and affect. Her behavior is normal. Judgment and thought content normal. Her mood appears not anxious. Cognition and memory are not impaired. She does not exhibit a depressed mood.   Results for orders placed or performed during the hospital encounter of 06/29/16  CBC  Result Value Ref Range   WBC 15.5 (H) 3.6 - 11.0 K/uL   RBC 4.55 3.80 - 5.20 MIL/uL   Hemoglobin 12.6 12.0 - 16.0 g/dL   HCT 37.6 35.0 - 47.0 %   MCV 82.6 80.0 - 100.0 fL   MCH 27.6 26.0 - 34.0 pg   MCHC 33.4 32.0 - 36.0 g/dL   RDW 16.0 (H) 11.5 - 14.5 %   Platelets 166 150 - 440 K/uL  Basic metabolic panel  Result Value Ref Range   Sodium 136 135 - 145 mmol/L   Potassium 3.2 (L) 3.5 - 5.1 mmol/L   Chloride 100 (L) 101 - 111 mmol/L   CO2 26 22 - 32 mmol/L   Glucose, Bld 116 (H) 65 - 99 mg/dL   BUN 17 6 - 20 mg/dL   Creatinine, Ser 0.81 0.44 - 1.00 mg/dL   Calcium 9.1 8.9 - 10.3 mg/dL   GFR calc non Af Amer >60 >60 mL/min   GFR calc Af Amer >60 >60 mL/min   Anion gap 10 5 - 15      Assessment & Plan:   Problem List Items Addressed This Visit      Cardiovascular and Mediastinum   Hypertension (Chronic)    Not controlled, she is content with her BP today; she does not want to adjust her medicine; try DASH guidelines      Coronary atherosclerosis    Reviewed CT scan report; continue aspirin; goal LDL less than 70; advised back to Dr. Ubaldo Glassing; call 911 if any chest pain      Atherosclerosis    Patient does not want surgery; does not want to see a vascular surgeon right now; says she will call me if she changes her mind      Aortic stenosis (Chronic)    Advised patient to get back in  with Dr. Ubaldo Glassing        Endocrine   IFG (impaired fasting glucose) (Chronic)    Check glucose and  A1c      Relevant Orders   Hemoglobin A1c     Genitourinary   CKD (chronic kidney disease) stage 3, GFR 30-59 ml/min (Chronic)    Avoid NSAIDs, hydrate, check GFR and Cr        Other   Vitamin D deficiency disease    Check level      Relevant Orders   VITAMIN D 25 Hydroxy (Vit-D Deficiency, Fractures)   Obesity    Stable; try to lose 5-10 pounds      Medication monitoring encounter    Check level      Relevant Orders   CBC with Differential/Platelet   COMPLETE METABOLIC PANEL WITH GFR   Hyperlipidemia    Check labs, avoid saturated      Relevant Orders   Lipid panel       Follow up plan: Return in about 6 months (around 06/29/2017) for twenty minute follow-up with fasting labs.  An after-visit summary was printed and given to the patient at [facility].  Please see the patient instructions which may contain other information and recommendations beyond what is mentioned above in the assessment and plan.  Meds ordered this encounter  Medications  . co-enzyme Q-10 30 MG capsule    Sig: Take 100 mg by mouth 3 (three) times daily.    Orders Placed This Encounter  Procedures  . CBC with Differential/Platelet  . COMPLETE METABOLIC PANEL WITH GFR  . VITAMIN D 25 Hydroxy (Vit-D Deficiency, Fractures)  . Hemoglobin A1c  . Lipid panel

## 2016-12-28 NOTE — Assessment & Plan Note (Signed)
Patient does not want surgery; does not want to see a vascular surgeon right now; says she will call me if she changes her mind

## 2016-12-28 NOTE — Assessment & Plan Note (Signed)
Advised patient to get back in with Dr. Ubaldo Glassing

## 2016-12-28 NOTE — Assessment & Plan Note (Signed)
Not controlled, she is content with her BP today; she does not want to adjust her medicine; try DASH guidelines

## 2016-12-28 NOTE — Assessment & Plan Note (Signed)
Check level 

## 2016-12-28 NOTE — Assessment & Plan Note (Signed)
Reviewed CT scan report; continue aspirin; goal LDL less than 70; advised back to Dr. Ubaldo Glassing; call 911 if any chest pain

## 2016-12-28 NOTE — Patient Instructions (Signed)
Work on losing 5-10 pounds before your next visit Avoid nonsteroidals as much as possible Please do see Dr. Ubaldo Glassing soon about your heart issues Try to limit saturated fats in your diet (bologna, hot dogs, barbeque, cheeseburgers, hamburgers, steak, bacon, sausage, cheese, etc.) and get more fresh fruits, vegetables, and whole grains

## 2016-12-28 NOTE — Assessment & Plan Note (Signed)
Stable; try to lose 5-10 pounds

## 2016-12-29 LAB — HEMOGLOBIN A1C
Hgb A1c MFr Bld: 5.5 % (ref <5.7)
MEAN PLASMA GLUCOSE: 111 mg/dL

## 2016-12-29 LAB — LIPID PANEL
CHOL/HDL RATIO: 3 ratio (ref <5.0)
CHOLESTEROL: 201 mg/dL — AB (ref <200)
HDL: 67 mg/dL (ref >50)
LDL Cholesterol: 109 mg/dL — ABNORMAL HIGH (ref <100)
Triglycerides: 125 mg/dL (ref <150)
VLDL: 25 mg/dL (ref <30)

## 2016-12-29 LAB — COMPLETE METABOLIC PANEL WITH GFR
ALBUMIN: 4.3 g/dL (ref 3.6–5.1)
ALT: 16 U/L (ref 6–29)
AST: 25 U/L (ref 10–35)
Alkaline Phosphatase: 55 U/L (ref 33–130)
BUN: 20 mg/dL (ref 7–25)
CHLORIDE: 100 mmol/L (ref 98–110)
CO2: 20 mmol/L (ref 20–31)
Calcium: 9.7 mg/dL (ref 8.6–10.4)
Creat: 0.83 mg/dL (ref 0.60–0.93)
GFR, EST NON AFRICAN AMERICAN: 67 mL/min (ref >=60)
GFR, Est African American: 78 mL/min (ref >=60)
GLUCOSE: 104 mg/dL — AB (ref 65–99)
POTASSIUM: 4 mmol/L (ref 3.5–5.3)
SODIUM: 140 mmol/L (ref 135–146)
Total Bilirubin: 0.6 mg/dL (ref 0.2–1.2)
Total Protein: 6.9 g/dL (ref 6.1–8.1)

## 2016-12-29 LAB — VITAMIN D 25 HYDROXY (VIT D DEFICIENCY, FRACTURES): Vit D, 25-Hydroxy: 40 ng/mL (ref 30–100)

## 2017-01-11 ENCOUNTER — Other Ambulatory Visit: Payer: Self-pay | Admitting: Family Medicine

## 2017-01-11 DIAGNOSIS — I2510 Atherosclerotic heart disease of native coronary artery without angina pectoris: Secondary | ICD-10-CM

## 2017-01-11 DIAGNOSIS — E782 Mixed hyperlipidemia: Secondary | ICD-10-CM

## 2017-01-11 DIAGNOSIS — Z5181 Encounter for therapeutic drug level monitoring: Secondary | ICD-10-CM

## 2017-01-11 DIAGNOSIS — I7090 Unspecified atherosclerosis: Secondary | ICD-10-CM

## 2017-01-11 MED ORDER — TRIAMTERENE-HCTZ 37.5-25 MG PO TABS: 1.0000 | ORAL_TABLET | Freq: Every day | ORAL | 3 refills | Status: DC

## 2017-01-11 NOTE — Telephone Encounter (Signed)
I sent her a note on 01/04/17 asking if she wanted to increase her Crestor Please see lab result note Thank you

## 2017-01-11 NOTE — Telephone Encounter (Signed)
Left detailed voicemail

## 2017-01-11 NOTE — Telephone Encounter (Signed)
Pt states she was her 12/28/16 and was supposed to get a refill on Rosuvastatin and Triamterene to be sent to [facility] in [place]. Pt requesting a 90 day supply.

## 2017-01-12 MED ORDER — ROSUVASTATIN CALCIUM 20 MG PO TABS: 20.0000 mg | ORAL_TABLET | Freq: Every day | ORAL | 1 refills | Status: DC

## 2017-01-12 MED ORDER — ROSUVASTATIN CALCIUM 20 MG PO TABS: 20.0000 mg | ORAL_TABLET | Freq: Every day | ORAL | 0 refills | Status: DC

## 2017-01-12 NOTE — Addendum Note (Signed)
Addended by: LADA, Satira Anis on: 01/12/2017 09:40 AM   Modules accepted: Orders

## 2017-01-12 NOTE — Telephone Encounter (Signed)
Pt notified, yes she is willing to increased dose

## 2017-01-12 NOTE — Telephone Encounter (Signed)
Let's recheck labs in 6 weeks

## 2017-01-14 ENCOUNTER — Encounter (INDEPENDENT_AMBULATORY_CARE_PROVIDER_SITE_OTHER): Payer: Medicare Other | Admitting: Ophthalmology

## 2017-01-14 DIAGNOSIS — H35033 Hypertensive retinopathy, bilateral: Secondary | ICD-10-CM | POA: Diagnosis not present

## 2017-01-14 DIAGNOSIS — H353231 Exudative age-related macular degeneration, bilateral, with active choroidal neovascularization: Secondary | ICD-10-CM | POA: Diagnosis not present

## 2017-01-14 DIAGNOSIS — H43813 Vitreous degeneration, bilateral: Secondary | ICD-10-CM

## 2017-01-14 DIAGNOSIS — D3131 Benign neoplasm of right choroid: Secondary | ICD-10-CM | POA: Diagnosis not present

## 2017-01-14 DIAGNOSIS — I10 Essential (primary) hypertension: Secondary | ICD-10-CM

## 2017-02-28 ENCOUNTER — Encounter (INDEPENDENT_AMBULATORY_CARE_PROVIDER_SITE_OTHER): Payer: Medicare Other | Admitting: Ophthalmology

## 2017-02-28 DIAGNOSIS — H353231 Exudative age-related macular degeneration, bilateral, with active choroidal neovascularization: Secondary | ICD-10-CM | POA: Diagnosis not present

## 2017-02-28 DIAGNOSIS — I10 Essential (primary) hypertension: Secondary | ICD-10-CM | POA: Diagnosis not present

## 2017-02-28 DIAGNOSIS — H35033 Hypertensive retinopathy, bilateral: Secondary | ICD-10-CM | POA: Diagnosis not present

## 2017-02-28 DIAGNOSIS — D3131 Benign neoplasm of right choroid: Secondary | ICD-10-CM | POA: Diagnosis not present

## 2017-02-28 DIAGNOSIS — H43813 Vitreous degeneration, bilateral: Secondary | ICD-10-CM

## 2017-03-16 DIAGNOSIS — H401130 Primary open-angle glaucoma, bilateral, stage unspecified: Secondary | ICD-10-CM | POA: Diagnosis not present

## 2017-03-16 DIAGNOSIS — H353131 Nonexudative age-related macular degeneration, bilateral, early dry stage: Secondary | ICD-10-CM | POA: Diagnosis not present

## 2017-04-15 ENCOUNTER — Encounter (INDEPENDENT_AMBULATORY_CARE_PROVIDER_SITE_OTHER): Payer: Medicare Other | Admitting: Ophthalmology

## 2017-04-15 DIAGNOSIS — D3131 Benign neoplasm of right choroid: Secondary | ICD-10-CM | POA: Diagnosis not present

## 2017-04-15 DIAGNOSIS — I10 Essential (primary) hypertension: Secondary | ICD-10-CM

## 2017-04-15 DIAGNOSIS — H353213 Exudative age-related macular degeneration, right eye, with inactive scar: Secondary | ICD-10-CM

## 2017-04-15 DIAGNOSIS — H43813 Vitreous degeneration, bilateral: Secondary | ICD-10-CM

## 2017-04-18 ENCOUNTER — Other Ambulatory Visit: Payer: Self-pay | Admitting: Family Medicine

## 2017-04-18 ENCOUNTER — Other Ambulatory Visit: Payer: Self-pay

## 2017-04-18 DIAGNOSIS — Z5181 Encounter for therapeutic drug level monitoring: Secondary | ICD-10-CM

## 2017-04-18 DIAGNOSIS — I2510 Atherosclerotic heart disease of native coronary artery without angina pectoris: Secondary | ICD-10-CM

## 2017-04-18 DIAGNOSIS — I7090 Unspecified atherosclerosis: Secondary | ICD-10-CM

## 2017-04-18 DIAGNOSIS — E782 Mixed hyperlipidemia: Secondary | ICD-10-CM

## 2017-04-18 MED ORDER — ROSUVASTATIN CALCIUM 20 MG PO TABS: 20.0000 mg | ORAL_TABLET | Freq: Every day | ORAL | 0 refills | Status: DC

## 2017-04-18 NOTE — Telephone Encounter (Signed)
Patients daughter notified, I looked she is due for labs will come in later this week isn't supposed to drive due to shots in eyes wants to see if you can give a few day supply to last until she gets blood work?

## 2017-04-18 NOTE — Telephone Encounter (Signed)
PT NEEDS REFILL ON ROSUVASTATIN 20MG . IT IS WORKING GOOD. SHE ONLY HAS 1 PILL LEFT. SHE IS NEEDING A 90 DAY SUPPLY. PHARM IS [address] IN [place].

## 2017-04-19 DIAGNOSIS — E782 Mixed hyperlipidemia: Secondary | ICD-10-CM | POA: Diagnosis not present

## 2017-04-19 DIAGNOSIS — Z5181 Encounter for therapeutic drug level monitoring: Secondary | ICD-10-CM | POA: Diagnosis not present

## 2017-04-19 DIAGNOSIS — I7090 Unspecified atherosclerosis: Secondary | ICD-10-CM | POA: Diagnosis not present

## 2017-04-19 DIAGNOSIS — I2510 Atherosclerotic heart disease of native coronary artery without angina pectoris: Secondary | ICD-10-CM | POA: Diagnosis not present

## 2017-04-20 ENCOUNTER — Other Ambulatory Visit: Payer: Self-pay | Admitting: Family Medicine

## 2017-04-20 LAB — LIPID PANEL
CHOLESTEROL: 171 mg/dL (ref <200)
HDL: 61 mg/dL (ref >50)
LDL Cholesterol: 88 mg/dL (ref <100)
TRIGLYCERIDES: 110 mg/dL (ref <150)
Total CHOL/HDL Ratio: 2.8 Ratio (ref <5.0)
VLDL: 22 mg/dL (ref <30)

## 2017-04-20 LAB — ALT: ALT: 14 U/L (ref 6–29)

## 2017-04-20 MED ORDER — ROSUVASTATIN CALCIUM 20 MG PO TABS: 20.0000 mg | ORAL_TABLET | Freq: Every day | ORAL | 3 refills | Status: DC

## 2017-04-20 NOTE — Progress Notes (Signed)
Sgpt and lipids reviewed; refills sent

## 2017-05-27 ENCOUNTER — Encounter (INDEPENDENT_AMBULATORY_CARE_PROVIDER_SITE_OTHER): Payer: Medicare Other | Admitting: Ophthalmology

## 2017-05-27 DIAGNOSIS — H43813 Vitreous degeneration, bilateral: Secondary | ICD-10-CM | POA: Diagnosis not present

## 2017-05-27 DIAGNOSIS — D3131 Benign neoplasm of right choroid: Secondary | ICD-10-CM | POA: Diagnosis not present

## 2017-05-27 DIAGNOSIS — H353231 Exudative age-related macular degeneration, bilateral, with active choroidal neovascularization: Secondary | ICD-10-CM

## 2017-05-27 DIAGNOSIS — I10 Essential (primary) hypertension: Secondary | ICD-10-CM | POA: Diagnosis not present

## 2017-05-27 DIAGNOSIS — H35033 Hypertensive retinopathy, bilateral: Secondary | ICD-10-CM | POA: Diagnosis not present

## 2017-06-29 ENCOUNTER — Ambulatory Visit (INDEPENDENT_AMBULATORY_CARE_PROVIDER_SITE_OTHER): Payer: Medicare Other | Admitting: Family Medicine

## 2017-06-29 ENCOUNTER — Encounter: Payer: Self-pay | Admitting: Family Medicine

## 2017-06-29 VITALS — BP 140/68 | HR 86 | Temp 98.1°F | Resp 16 | Wt 160.0 lb

## 2017-06-29 DIAGNOSIS — E6609 Other obesity due to excess calories: Secondary | ICD-10-CM

## 2017-06-29 DIAGNOSIS — Z789 Other specified health status: Secondary | ICD-10-CM | POA: Diagnosis not present

## 2017-06-29 DIAGNOSIS — E782 Mixed hyperlipidemia: Secondary | ICD-10-CM

## 2017-06-29 DIAGNOSIS — I350 Nonrheumatic aortic (valve) stenosis: Secondary | ICD-10-CM

## 2017-06-29 DIAGNOSIS — I2510 Atherosclerotic heart disease of native coronary artery without angina pectoris: Secondary | ICD-10-CM

## 2017-06-29 DIAGNOSIS — E559 Vitamin D deficiency, unspecified: Secondary | ICD-10-CM | POA: Diagnosis not present

## 2017-06-29 DIAGNOSIS — Z6831 Body mass index (BMI) 31.0-31.9, adult: Secondary | ICD-10-CM

## 2017-06-29 DIAGNOSIS — G8929 Other chronic pain: Secondary | ICD-10-CM

## 2017-06-29 DIAGNOSIS — M25511 Pain in right shoulder: Secondary | ICD-10-CM | POA: Diagnosis not present

## 2017-06-29 DIAGNOSIS — N183 Chronic kidney disease, stage 3 unspecified: Secondary | ICD-10-CM

## 2017-06-29 DIAGNOSIS — R7301 Impaired fasting glucose: Secondary | ICD-10-CM | POA: Diagnosis not present

## 2017-06-29 NOTE — Assessment & Plan Note (Signed)
Resolved on last vitamin D check

## 2017-06-29 NOTE — Assessment & Plan Note (Signed)
Last Cr was in April 2018; stable; avoid NSAIDs

## 2017-06-29 NOTE — Patient Instructions (Addendum)
Do please see Dr. Ubaldo Glassing (cardiologist) for your heart issues; please call to make an appointment soon We'll have you see the doctor about your shoulder (we'll make that appointment) Keep up the great work

## 2017-06-29 NOTE — Progress Notes (Signed)
BP 140/68   Pulse 86   Temp 98.1 F (36.7 C) (Oral)   Resp 16   Wt 160 lb (72.6 kg)   SpO2 94%   BMI 31.25 kg/m    Subjective:    Patient ID: Angela Duffy, female    DOB: 10-13-1934, 82 y.o.   MRN: [id number]  HPI: Angela Duffy is a 82 y.o. female  Chief Complaint  Patient presents with  . Follow-up    6 month    HPI Patient is here for f/u She is having frozen shoulder symptoms on the LEFT since arterial procedure (couple of years ago); worse if she sleeps on that side; can't reach across to put deodorant under right arm, can't reach up brush hair easily; cannot reach behind to hook bra, so has to wear front-hook bra which are more expensive She is taking avastin and having side effects; she has talked to the eye doctor about this already, but has noticed belly weight and breast enlargement; she just wanted me to know about her weight and is eating right and this is why she cannot lose weight she says; some SHOB from increased bra size, belly weight; no chest pain; no leg edema; she is willing to put up with the side effects if it means not going blind and being able to see; she can see a lot better now with this medicine She has high cholesterol; last labs reviewed from August 13th; total chol 171, TG 110, HDL 61, LDL 88; she is on a statin She previously had A1c readings in the "prediabetic" range; August 2016 the A1c was 5.8, and it was 5.9 in January of 2017; since then, she has had two readings in the normal range: 5.6 in October 2017, and 5.5 in April 2018 She says her BP goes up for a short time at the ophthalmologist but then comes down; knowing that she is having tests and shots are coming Last vitamin D was normal She has had both PCV-13 and PPSV-23 since turning 82 years old Flu shot UTD this season She does not want a DEXA scan or mammogram I'm not ready to call it quits  Depression screen [facility] 2/9 06/29/2017 12/28/2016 06/18/2016 04/24/2015  Decreased  Interest 0 0 0 1  Down, Depressed, Hopeless 1 0 0 1  PHQ - 2 Score 1 0 0 2    Relevant past medical, surgical, family and social history reviewed Past Medical History:  Diagnosis Date  . Aortic stenosis   . Atherosclerosis    on Chest CT 08/2014; 70-80% innominate artery, right subclavian artery, 75% celiac artery, 3 vessel coronary artery involvement  . CKD (chronic kidney disease) stage 3, GFR 30-59 ml/min (HCC)   . Coronary atherosclerosis   . History of tobacco use   . Hyperlipidemia   . Hypertension   . IFG (impaired fasting glucose)   . Left ventricular diastolic dysfunction   . Left ventricular hypertrophy   . Mitral valve regurgitation   . Obesity   . PVD (peripheral vascular disease) ([place])   . Renal artery atherosclerosis (HCC)    greater than 60% stenosis right renal artery  . Tricuspid regurgitation   . Vitamin B12 deficiency   . Vitamin D deficiency disease    Past Surgical History:  Procedure Laterality Date  . CAROTID SURGERY  Feb 2004  . CERVICAL DISCECTOMY  1970s  . ILIAC ARTERY STENT  2003  . KIDNEY STENT  April 2016  . LAPAROSCOPY  1970s  Family History  Problem Relation Age of Onset  . Cancer Mother        colon  . Emphysema Father   . Heart disease Father   . Cancer Sister 79       breast  . Multiple sclerosis Brother   . Hypertension Sister   . Cancer Daughter        breast  . Cancer Paternal Grandmother        female   Social History   Social History  . Marital status: Single    Spouse name: N/A  . Number of children: N/A  . Years of education: N/A   Occupational History  . Not on file.   Social History Main Topics  . Smoking status: Former Smoker    Types: Cigarettes    Quit date: 04/14/2014  . Smokeless tobacco: Never Used  . Alcohol use Yes  . Drug use: No  . Sexual activity: Not Currently   Other Topics Concern  . Not on file   Social History Narrative  . No narrative on file    Interim medical history since last  visit reviewed. Allergies and medications reviewed  Review of Systems  Gastrointestinal: Negative for blood in stool.  Genitourinary: Negative for hematuria.  Hematological: Bruises/bleeds easily (on plavix).   Per HPI unless specifically indicated above     Objective:    BP 140/68   Pulse 86   Temp 98.1 F (36.7 C) (Oral)   Resp 16   Wt 160 lb (72.6 kg)   SpO2 94%   BMI 31.25 kg/m   Wt Readings from Last 3 Encounters:  06/29/17 160 lb (72.6 kg)  12/28/16 159 lb 1.6 oz (72.2 kg)  06/29/16 158 lb (71.7 kg)    Physical Exam  Constitutional: She appears well-developed and well-nourished. No distress.  HENT:  Head: Normocephalic and atraumatic.  Eyes: EOM are normal. No scleral icterus.  Neck: No thyromegaly present.  Cardiovascular: Normal rate and regular rhythm.   Murmur heard.  Systolic murmur is present with a grade of 2/6  Pulmonary/Chest: Effort normal and breath sounds normal. No respiratory distress. She has no wheezes.  Abdominal: Soft. Bowel sounds are normal. She exhibits no distension.  Musculoskeletal: She exhibits no edema.       Left shoulder: She exhibits decreased range of motion. She exhibits no tenderness, no bony tenderness, no swelling, no effusion and no deformity.  Neurological: She is alert. She exhibits normal muscle tone.  Skin: Skin is warm and dry. She is not diaphoretic. No pallor.  Psychiatric: She has a normal mood and affect. Her behavior is normal. Judgment and thought content normal.   Results for orders placed or performed in visit on 04/18/17  ALT  Result Value Ref Range   ALT 14 6 - 29 U/L  Lipid panel  Result Value Ref Range   Cholesterol 171 <200 mg/dL   Triglycerides 110 <150 mg/dL   HDL 61 >50 mg/dL   Total CHOL/HDL Ratio 2.8 <5.0 Ratio   VLDL 22 <30 mg/dL   LDL Cholesterol 88 <100 mg/dL      Assessment & Plan:   Problem List Items Addressed This Visit      Cardiovascular and Mediastinum   Aortic stenosis (Chronic)      Encouraged patient to f/u with cardiologist regularly        Endocrine   IFG (impaired fasting glucose) (Chronic)    Last two A1c readings have been completely normal; discussed with patient  Genitourinary   CKD (chronic kidney disease) stage 3, GFR 30-59 ml/min (HCC) (Chronic)    Last Cr was in April 2018; stable; avoid NSAIDs        Other   Vitamin D deficiency disease    Resolved on last vitamin D check      Obesity    Weight stable; see AVS      Hyperlipidemia    Contin ue statin      Full code status    Full code at this time; updated orders      Relevant Orders   Full code    Other Visit Diagnoses    Chronic right shoulder pain    -  Primary   Relevant Orders   Ambulatory referral to Orthopedic Surgery       Follow up plan: Return in about 4 months (around 10/19/2017) for twenty minute follow-up with fasting labs with Dr. Sanda Klein.  An after-visit summary was printed and given to the patient at [facility].  Please see the patient instructions which may contain other information and recommendations beyond what is mentioned above in the assessment and plan.  No orders of the defined types were placed in this encounter.   Orders Placed This Encounter  Procedures  . Ambulatory referral to Orthopedic Surgery  . Full code

## 2017-06-29 NOTE — Assessment & Plan Note (Signed)
Continue statin. 

## 2017-06-29 NOTE — Assessment & Plan Note (Signed)
Full code at this time; updated orders

## 2017-06-29 NOTE — Assessment & Plan Note (Signed)
Last two A1c readings have been completely normal; discussed with patient

## 2017-06-29 NOTE — Assessment & Plan Note (Signed)
Weight stable; see AVS

## 2017-06-29 NOTE — Assessment & Plan Note (Signed)
Encouraged patient to f/u with cardiologist regularly

## 2017-07-04 DIAGNOSIS — M25512 Pain in left shoulder: Secondary | ICD-10-CM | POA: Diagnosis not present

## 2017-07-04 DIAGNOSIS — M19012 Primary osteoarthritis, left shoulder: Secondary | ICD-10-CM | POA: Diagnosis not present

## 2017-07-07 DIAGNOSIS — Z23 Encounter for immunization: Secondary | ICD-10-CM | POA: Diagnosis not present

## 2017-07-18 ENCOUNTER — Encounter (INDEPENDENT_AMBULATORY_CARE_PROVIDER_SITE_OTHER): Payer: Medicare Other | Admitting: Ophthalmology

## 2017-07-18 DIAGNOSIS — D3131 Benign neoplasm of right choroid: Secondary | ICD-10-CM

## 2017-07-18 DIAGNOSIS — I10 Essential (primary) hypertension: Secondary | ICD-10-CM | POA: Diagnosis not present

## 2017-07-18 DIAGNOSIS — H43813 Vitreous degeneration, bilateral: Secondary | ICD-10-CM | POA: Diagnosis not present

## 2017-07-18 DIAGNOSIS — H353231 Exudative age-related macular degeneration, bilateral, with active choroidal neovascularization: Secondary | ICD-10-CM | POA: Diagnosis not present

## 2017-07-18 DIAGNOSIS — H35033 Hypertensive retinopathy, bilateral: Secondary | ICD-10-CM

## 2017-08-24 ENCOUNTER — Encounter (INDEPENDENT_AMBULATORY_CARE_PROVIDER_SITE_OTHER): Payer: Medicare Other | Admitting: Ophthalmology

## 2017-08-24 DIAGNOSIS — I10 Essential (primary) hypertension: Secondary | ICD-10-CM | POA: Diagnosis not present

## 2017-08-24 DIAGNOSIS — H43813 Vitreous degeneration, bilateral: Secondary | ICD-10-CM | POA: Diagnosis not present

## 2017-08-24 DIAGNOSIS — H35033 Hypertensive retinopathy, bilateral: Secondary | ICD-10-CM

## 2017-08-24 DIAGNOSIS — H353231 Exudative age-related macular degeneration, bilateral, with active choroidal neovascularization: Secondary | ICD-10-CM

## 2017-08-24 DIAGNOSIS — D3131 Benign neoplasm of right choroid: Secondary | ICD-10-CM | POA: Diagnosis not present

## 2017-08-26 ENCOUNTER — Encounter (INDEPENDENT_AMBULATORY_CARE_PROVIDER_SITE_OTHER): Payer: Medicare Other | Admitting: Ophthalmology

## 2017-10-07 ENCOUNTER — Encounter (INDEPENDENT_AMBULATORY_CARE_PROVIDER_SITE_OTHER): Payer: Medicare Other | Admitting: Ophthalmology

## 2017-10-07 DIAGNOSIS — H35033 Hypertensive retinopathy, bilateral: Secondary | ICD-10-CM | POA: Diagnosis not present

## 2017-10-07 DIAGNOSIS — H353231 Exudative age-related macular degeneration, bilateral, with active choroidal neovascularization: Secondary | ICD-10-CM

## 2017-10-07 DIAGNOSIS — H43813 Vitreous degeneration, bilateral: Secondary | ICD-10-CM | POA: Diagnosis not present

## 2017-10-07 DIAGNOSIS — I10 Essential (primary) hypertension: Secondary | ICD-10-CM | POA: Diagnosis not present

## 2017-10-07 DIAGNOSIS — D3131 Benign neoplasm of right choroid: Secondary | ICD-10-CM | POA: Diagnosis not present

## 2017-10-31 ENCOUNTER — Encounter: Payer: Self-pay | Admitting: Family Medicine

## 2017-10-31 ENCOUNTER — Ambulatory Visit (INDEPENDENT_AMBULATORY_CARE_PROVIDER_SITE_OTHER): Payer: Medicare Other | Admitting: Family Medicine

## 2017-10-31 VITALS — BP 144/78 | HR 84 | Temp 98.4°F | Resp 14 | Wt 164.7 lb

## 2017-10-31 DIAGNOSIS — Z6832 Body mass index (BMI) 32.0-32.9, adult: Secondary | ICD-10-CM

## 2017-10-31 DIAGNOSIS — R7301 Impaired fasting glucose: Secondary | ICD-10-CM | POA: Diagnosis not present

## 2017-10-31 DIAGNOSIS — E6609 Other obesity due to excess calories: Secondary | ICD-10-CM

## 2017-10-31 DIAGNOSIS — I2510 Atherosclerotic heart disease of native coronary artery without angina pectoris: Secondary | ICD-10-CM

## 2017-10-31 DIAGNOSIS — E782 Mixed hyperlipidemia: Secondary | ICD-10-CM

## 2017-10-31 DIAGNOSIS — M7552 Bursitis of left shoulder: Secondary | ICD-10-CM | POA: Diagnosis not present

## 2017-10-31 DIAGNOSIS — R635 Abnormal weight gain: Secondary | ICD-10-CM

## 2017-10-31 DIAGNOSIS — I739 Peripheral vascular disease, unspecified: Secondary | ICD-10-CM | POA: Diagnosis not present

## 2017-10-31 DIAGNOSIS — I10 Essential (primary) hypertension: Secondary | ICD-10-CM | POA: Diagnosis not present

## 2017-10-31 DIAGNOSIS — Z5181 Encounter for therapeutic drug level monitoring: Secondary | ICD-10-CM

## 2017-10-31 DIAGNOSIS — I350 Nonrheumatic aortic (valve) stenosis: Secondary | ICD-10-CM | POA: Diagnosis not present

## 2017-10-31 DIAGNOSIS — N183 Chronic kidney disease, stage 3 unspecified: Secondary | ICD-10-CM

## 2017-10-31 DIAGNOSIS — I701 Atherosclerosis of renal artery: Secondary | ICD-10-CM

## 2017-10-31 DIAGNOSIS — Z789 Other specified health status: Secondary | ICD-10-CM | POA: Diagnosis not present

## 2017-10-31 NOTE — Assessment & Plan Note (Signed)
Check liver and kidneys 

## 2017-10-31 NOTE — Assessment & Plan Note (Signed)
Check glucose and A1c 

## 2017-10-31 NOTE — Assessment & Plan Note (Signed)
Confirmed today; does not want a DNR in her current status

## 2017-10-31 NOTE — Assessment & Plan Note (Signed)
Encouraged patient to see cardiologist

## 2017-10-31 NOTE — Assessment & Plan Note (Signed)
Managed by vascular and nephrologist

## 2017-10-31 NOTE — Assessment & Plan Note (Signed)
Fair control; DASH guidelines

## 2017-10-31 NOTE — Assessment & Plan Note (Signed)
Saw ortho.  

## 2017-10-31 NOTE — Assessment & Plan Note (Signed)
Encouraged modest weight loss 

## 2017-10-31 NOTE — Assessment & Plan Note (Signed)
Seen by nephrologist; avoid NSAIDs

## 2017-10-31 NOTE — Assessment & Plan Note (Signed)
Check lipids today 

## 2017-10-31 NOTE — Patient Instructions (Addendum)
If you need something for aches or pains, try to use Tylenol (acetaminophen) instead of non-steroidals (which include Aleve, ibuprofen, Advil, Motrin, and naproxen); non-steroidals can cause long-term kidney damage Check out the information at familydoctor.org entitled "Nutrition for Weight Loss: What You Need to Know about Fad Diets" Try to lose between 1-2 pounds per week by taking in fewer calories and burning off more calories You can succeed by limiting portions, limiting foods dense in calories and fat, becoming more active, and drinking 8 glasses of water a day (64 ounces) Don't skip meals, especially breakfast, as skipping meals may alter your metabolism Do not use over-the-counter weight loss pills or gimmicks that claim rapid weight loss A healthy BMI (or body mass index) is between 18.5 and 24.9 You can calculate your ideal BMI at the [employer] website ClubMon[employer].fr If you have not heard anything from my staff in a week about any orders/referrals/studies from today, please contact us here to follow-up [phone]

## 2017-10-31 NOTE — Assessment & Plan Note (Signed)
F/u with cardiologist 

## 2017-10-31 NOTE — Progress Notes (Signed)
BP (!) 144/78   Pulse 84   Temp 98.4 F (36.9 C) (Oral)   Resp 14   Wt 164 lb 11.2 oz (74.7 kg)   SpO2 93%   BMI 32.17 kg/m    Subjective:    Patient ID: Angela Duffy, female    DOB: November 04, 1934, 83 y.o.   MRN: [id number]  HPI: Angela Duffy is a 83 y.o. female  Chief Complaint  Patient presents with  . Follow-up    HPI Patient is here for f/u  Prediabetes Lab Results  Component Value Date   HGBA1C 5.5 12/28/2016   High cholesterol; on statin Lab Results  Component Value Date   CHOL 171 04/18/2017   HDL 61 04/18/2017   [place] 88 04/18/2017   TRIG 110 04/18/2017   CHOLHDL 2.8 04/18/2017   HTN; lots of stress with family health issues, daughter has breast cancer, age 89; reading about increased risk of breast cancer, risk factors; daughter was not getting mammograms regularly; getting chemo and radiation; patient checking her breasts every time she takes a shower; she does not want a mammogram; would not want surgery or treatments like her daughter  Aortic athero; on aspirin and statin; aortic stenosis; has not been back to see Dr. Ubaldo Glassing  PVD; seen by vascular; sent in renal artery and iliac and had carotid cleaned out first  CKD, seen by nephrologist  Seeing eye doctor, having shots in the eyes for macular degeneration; the medicine is causing weight gain she says  Depression screen [facility] 2/9 10/31/2017 06/29/2017 12/28/2016 06/18/2016 04/24/2015  Decreased Interest 0 0 0 0 1  Down, Depressed, Hopeless 0 1 0 0 1  PHQ - 2 Score 0 1 0 0 2    Relevant past medical, surgical, family and social history reviewed Past Medical History:  Diagnosis Date  . Aortic stenosis   . Atherosclerosis    on Chest CT 08/2014; 70-80% innominate artery, right subclavian artery, 75% celiac artery, 3 vessel coronary artery involvement  . CKD (chronic kidney disease) stage 3, GFR 30-59 ml/min (HCC)   . Coronary atherosclerosis   . History of tobacco use   .  Hyperlipidemia   . Hypertension   . IFG (impaired fasting glucose)   . Left ventricular diastolic dysfunction   . Left ventricular hypertrophy   . Mitral valve regurgitation   . Obesity   . PVD (peripheral vascular disease) ([place])   . Renal artery atherosclerosis (HCC)    greater than 60% stenosis right renal artery  . Tricuspid regurgitation   . Vitamin B12 deficiency   . Vitamin D deficiency disease    Past Surgical History:  Procedure Laterality Date  . CAROTID SURGERY  Feb 2004  . CERVICAL DISCECTOMY  1970s  . ILIAC ARTERY STENT  2003  . KIDNEY STENT  April 2016  . LAPAROSCOPY  1970s   Family History  Problem Relation Age of Onset  . Cancer Mother        colon  . Emphysema Father   . Heart disease Father   . Cancer Sister 27       breast  . Multiple sclerosis Brother   . Hypertension Sister   . Cancer Daughter        breast  . Cancer Paternal Grandmother        female   Social History   Tobacco Use  . Smoking status: Former Smoker    Types: Cigarettes    Last attempt to quit: 04/14/2014  Years since quitting: 3.5  . Smokeless tobacco: Never Used  Substance Use Topics  . Alcohol use: Yes  . Drug use: No    Interim medical history since last visit reviewed. Allergies and medications reviewed  Review of Systems  Constitutional: Positive for unexpected weight change.  Respiratory: Positive for shortness of breath (with walking 1/10 of a mile or climbing stairs).   Cardiovascular: Negative for chest pain.  Musculoskeletal:       Bursitis in the left shoulder; saw ortho   Per HPI unless specifically indicated above     Objective:    BP (!) 144/78   Pulse 84   Temp 98.4 F (36.9 C) (Oral)   Resp 14   Wt 164 lb 11.2 oz (74.7 kg)   SpO2 93%   BMI 32.17 kg/m   Wt Readings from Last 3 Encounters:  10/31/17 164 lb 11.2 oz (74.7 kg)  06/29/17 160 lb (72.6 kg)  12/28/16 159 lb 1.6 oz (72.2 kg)    Physical Exam  Constitutional: She appears  well-developed and well-nourished. No distress.  HENT:  Head: Normocephalic and atraumatic.  Eyes: EOM are normal. No scleral icterus.  Neck: Carotid bruit is present (1+ on the LEFT, very soft on the RIGHT). No thyromegaly present.  Cardiovascular: Normal rate and regular rhythm.  Murmur heard.  Systolic murmur is present with a grade of 2/6. Murmur 2+ over LUSB, 1+ over RUSB SEM  Pulmonary/Chest: Effort normal and breath sounds normal. No respiratory distress. She has no wheezes.  Abdominal: Soft. Bowel sounds are normal. She exhibits no distension.  Musculoskeletal: She exhibits no edema.       Left shoulder: She exhibits decreased range of motion. She exhibits no tenderness, no bony tenderness, no swelling, no effusion and no deformity.  Neurological: She is alert. She exhibits normal muscle tone.  Skin: Skin is warm and dry. She is not diaphoretic. No pallor.  Psychiatric: She has a normal mood and affect. Her behavior is normal. Judgment and thought content normal.    Results for orders placed or performed in visit on 04/18/17  ALT  Result Value Ref Range   ALT 14 6 - 29 U/L  Lipid panel  Result Value Ref Range   Cholesterol 171 <200 mg/dL   Triglycerides 110 <150 mg/dL   HDL 61 >50 mg/dL   Total CHOL/HDL Ratio 2.8 <5.0 Ratio   VLDL 22 <30 mg/dL   LDL Cholesterol 88 <100 mg/dL      Assessment & Plan:   Problem List Items Addressed This Visit      Cardiovascular and Mediastinum   Renal artery atherosclerosis ([place])    Managed by vascular and nephrologist      Relevant Orders   Lipid panel   PVD (peripheral vascular disease) ([place])    Checked pulses today      Hypertension - Primary (Chronic)    Fair control; DASH guidelines      Coronary atherosclerosis    Encouraged patient to see cardiologist      Relevant Orders   Lipid panel   Aortic stenosis (Chronic)    F/u with cardiologist        Endocrine   IFG (impaired fasting glucose) (Chronic)    Check  glucose and A1c      Relevant Orders   Hemoglobin A1c     Musculoskeletal and Integument   Bursitis of left shoulder    Saw ortho        Genitourinary   CKD (  chronic kidney disease) stage 3, GFR 30-59 ml/min (HCC) (Chronic)    Seen by nephrologist; avoid NSAIDs        Other   Obesity    Encouraged modest weight loss      Medication monitoring encounter    Check liver and kidneys      Relevant Orders   CBC with Differential/Platelet   COMPLETE METABOLIC PANEL WITH GFR   Hyperlipidemia    Check lipids today      Relevant Orders   Lipid panel   Full code status    Confirmed today; does not want a DNR in her current status       Other Visit Diagnoses    Weight gain       Relevant Orders   TSH       Follow up plan: Return in about 6 months (around 04/30/2018) for follow-up visit with Dr. Sanda Klein.  An after-visit summary was printed and given to the patient at [place].  Please see the patient instructions which may contain other information and recommendations beyond what is mentioned above in the assessment and plan.  No orders of the defined types were placed in this encounter.   Orders Placed This Encounter  Procedures  . CBC with Differential/Platelet  . COMPLETE METABOLIC PANEL WITH GFR  . Hemoglobin A1c  . Lipid panel  . TSH

## 2017-10-31 NOTE — Assessment & Plan Note (Signed)
Checked pulses today

## 2017-11-01 LAB — COMPLETE METABOLIC PANEL WITH GFR
AG Ratio: 1.7 (calc) (ref 1.0–2.5)
ALBUMIN MSPROF: 4.3 g/dL (ref 3.6–5.1)
ALKALINE PHOSPHATASE (APISO): 56 U/L (ref 33–130)
ALT: 19 U/L (ref 6–29)
AST: 25 U/L (ref 10–35)
BILIRUBIN TOTAL: 0.6 mg/dL (ref 0.2–1.2)
BUN: 18 mg/dL (ref 7–25)
CHLORIDE: 102 mmol/L (ref 98–110)
CO2: 31 mmol/L (ref 20–32)
CREATININE: 0.86 mg/dL (ref 0.60–0.88)
Calcium: 9.8 mg/dL (ref 8.6–10.4)
GFR, Est African American: 74 mL/min/{1.73_m2} (ref >=60)
GFR, Est Non African American: 64 mL/min/{1.73_m2} (ref >=60)
GLUCOSE: 109 mg/dL — AB (ref 65–99)
Globulin: 2.6 g/dL (calc) (ref 1.9–3.7)
Potassium: 3.9 mmol/L (ref 3.5–5.3)
SODIUM: 142 mmol/L (ref 135–146)
Total Protein: 6.9 g/dL (ref 6.1–8.1)

## 2017-11-01 LAB — CBC WITH DIFFERENTIAL/PLATELET
Basophils Absolute: 28 cells/uL (ref 0–200)
Basophils Relative: 0.6 %
EOS ABS: 18 {cells}/uL (ref 15–500)
EOS PCT: 0.4 %
HCT: 39.2 % (ref 35.0–45.0)
HEMOGLOBIN: 13.1 g/dL (ref 11.7–15.5)
Lymphs Abs: 667 cells/uL — ABNORMAL LOW (ref 850–3900)
MCH: 27.6 pg (ref 27.0–33.0)
MCHC: 33.4 g/dL (ref 32.0–36.0)
MCV: 82.7 fL (ref 80.0–100.0)
MONOS PCT: 9.7 %
MPV: 11.7 fL (ref 7.5–12.5)
NEUTROS ABS: 3441 {cells}/uL (ref 1500–7800)
NEUTROS PCT: 74.8 %
Platelets: 203 10*3/uL (ref 140–400)
RBC: 4.74 10*6/uL (ref 3.80–5.10)
RDW: 15 % (ref 11.0–15.0)
TOTAL LYMPHOCYTE: 14.5 %
WBC mixed population: 446 cells/uL (ref 200–950)
WBC: 4.6 10*3/uL (ref 3.8–10.8)

## 2017-11-01 LAB — TSH: TSH: 2.53 mIU/L (ref 0.40–4.50)

## 2017-11-01 LAB — LIPID PANEL
CHOLESTEROL: 200 mg/dL — AB (ref <200)
HDL: 59 mg/dL (ref >50)
LDL CHOLESTEROL (CALC): 117 mg/dL — AB
Non-HDL Cholesterol (Calc): 141 mg/dL (calc) — ABNORMAL HIGH (ref <130)
Total CHOL/HDL Ratio: 3.4 (calc) (ref <5.0)
Triglycerides: 127 mg/dL (ref <150)

## 2017-11-01 LAB — HEMOGLOBIN A1C
EAG (MMOL/L): 6.6 (calc)
HEMOGLOBIN A1C: 5.8 %{Hb} — AB (ref <5.7)
MEAN PLASMA GLUCOSE: 120 (calc)

## 2017-11-18 ENCOUNTER — Encounter (INDEPENDENT_AMBULATORY_CARE_PROVIDER_SITE_OTHER): Payer: Medicare Other | Admitting: Ophthalmology

## 2017-11-18 DIAGNOSIS — H353231 Exudative age-related macular degeneration, bilateral, with active choroidal neovascularization: Secondary | ICD-10-CM | POA: Diagnosis not present

## 2017-11-18 DIAGNOSIS — H35033 Hypertensive retinopathy, bilateral: Secondary | ICD-10-CM | POA: Diagnosis not present

## 2017-11-18 DIAGNOSIS — H43813 Vitreous degeneration, bilateral: Secondary | ICD-10-CM

## 2017-11-18 DIAGNOSIS — D3131 Benign neoplasm of right choroid: Secondary | ICD-10-CM

## 2017-11-18 DIAGNOSIS — I10 Essential (primary) hypertension: Secondary | ICD-10-CM | POA: Diagnosis not present

## 2017-12-23 ENCOUNTER — Encounter (INDEPENDENT_AMBULATORY_CARE_PROVIDER_SITE_OTHER): Payer: Medicare Other | Admitting: Ophthalmology

## 2017-12-23 DIAGNOSIS — D3131 Benign neoplasm of right choroid: Secondary | ICD-10-CM

## 2017-12-23 DIAGNOSIS — H35033 Hypertensive retinopathy, bilateral: Secondary | ICD-10-CM | POA: Diagnosis not present

## 2017-12-23 DIAGNOSIS — H353231 Exudative age-related macular degeneration, bilateral, with active choroidal neovascularization: Secondary | ICD-10-CM

## 2017-12-23 DIAGNOSIS — H43813 Vitreous degeneration, bilateral: Secondary | ICD-10-CM | POA: Diagnosis not present

## 2017-12-23 DIAGNOSIS — I10 Essential (primary) hypertension: Secondary | ICD-10-CM | POA: Diagnosis not present

## 2017-12-28 ENCOUNTER — Other Ambulatory Visit: Payer: Self-pay | Admitting: Family Medicine

## 2017-12-28 MED ORDER — TRIAMTERENE-HCTZ 37.5-25 MG PO TABS: 1.0000 | ORAL_TABLET | Freq: Every day | ORAL | 1 refills | Status: DC

## 2017-12-28 MED ORDER — CLOPIDOGREL BISULFATE 75 MG PO TABS: 75.0000 mg | ORAL_TABLET | Freq: Every day | ORAL | 1 refills | Status: DC

## 2017-12-28 NOTE — Telephone Encounter (Signed)
Copied from [place] [id number]. Topic: Quick Communication - Rx Refill/Question >> Dec 28, 2017 11:39 AM Cleaster Corin, NT wrote: Medication: clopidogrel (PLAVIX) 75 MG tablet [[id number]]  triamterene-hydrochlorothiazide (MAXZIDE-25) 37.5-25 MG tablet [[id number]]  rosuvastatin (CRESTOR) 20 MG tablet [[id number]]  Has the patient contacted their pharmacy?no (Agent: If no, request that the patient contact the pharmacy for the refill.) Preferred Pharmacy (with phone number or street name): [facility] [place], [place] - [place] [address] Phone: [phone] Fax: [phone]   Agent: Please be advised that RX refills may take up to 3 business days. We ask that you follow-up with your pharmacy.

## 2017-12-28 NOTE — Telephone Encounter (Signed)
Rosuvastatin rx not due

## 2018-02-03 ENCOUNTER — Encounter (INDEPENDENT_AMBULATORY_CARE_PROVIDER_SITE_OTHER): Payer: Medicare Other | Admitting: Ophthalmology

## 2018-02-03 DIAGNOSIS — D3131 Benign neoplasm of right choroid: Secondary | ICD-10-CM

## 2018-02-03 DIAGNOSIS — H43813 Vitreous degeneration, bilateral: Secondary | ICD-10-CM

## 2018-02-03 DIAGNOSIS — I10 Essential (primary) hypertension: Secondary | ICD-10-CM | POA: Diagnosis not present

## 2018-02-03 DIAGNOSIS — H353231 Exudative age-related macular degeneration, bilateral, with active choroidal neovascularization: Secondary | ICD-10-CM | POA: Diagnosis not present

## 2018-02-03 DIAGNOSIS — H35033 Hypertensive retinopathy, bilateral: Secondary | ICD-10-CM

## 2018-03-16 DIAGNOSIS — H401131 Primary open-angle glaucoma, bilateral, mild stage: Secondary | ICD-10-CM | POA: Diagnosis not present

## 2018-03-16 DIAGNOSIS — H353131 Nonexudative age-related macular degeneration, bilateral, early dry stage: Secondary | ICD-10-CM | POA: Diagnosis not present

## 2018-03-17 ENCOUNTER — Encounter (INDEPENDENT_AMBULATORY_CARE_PROVIDER_SITE_OTHER): Payer: Medicare Other | Admitting: Ophthalmology

## 2018-03-17 DIAGNOSIS — H353231 Exudative age-related macular degeneration, bilateral, with active choroidal neovascularization: Secondary | ICD-10-CM | POA: Diagnosis not present

## 2018-03-17 DIAGNOSIS — H35033 Hypertensive retinopathy, bilateral: Secondary | ICD-10-CM

## 2018-03-17 DIAGNOSIS — H43813 Vitreous degeneration, bilateral: Secondary | ICD-10-CM

## 2018-03-17 DIAGNOSIS — D3131 Benign neoplasm of right choroid: Secondary | ICD-10-CM | POA: Diagnosis not present

## 2018-03-17 DIAGNOSIS — I10 Essential (primary) hypertension: Secondary | ICD-10-CM | POA: Diagnosis not present

## 2018-04-20 ENCOUNTER — Ambulatory Visit (INDEPENDENT_AMBULATORY_CARE_PROVIDER_SITE_OTHER): Payer: Medicare Other

## 2018-04-20 VITALS — BP 160/72 | HR 83 | Temp 98.3°F | Resp 12 | Ht 60.0 in | Wt 163.4 lb

## 2018-04-20 DIAGNOSIS — Z0000 Encounter for general adult medical examination without abnormal findings: Secondary | ICD-10-CM | POA: Diagnosis not present

## 2018-04-20 NOTE — Progress Notes (Signed)
Subjective:   Angela Duffy is a 83 y.o. female who presents for an Initial Medicare Annual Wellness Visit.  Review of Systems     N/A  Cardiac Risk Factors include: advanced age (>46men, >14 women);dyslipidemia;hypertension;obesity (BMI >30kg/m2);sedentary lifestyle     Objective:    Today's Vitals   04/20/18 1434  BP: (!) 160/72  Pulse: 83  Resp: 12  Temp: 98.3 F (36.8 C)  TempSrc: Oral  SpO2: 96%  Weight: 163 lb 6.4 oz (74.1 kg)  Height: 5' (1.524 m)   Body mass index is 31.91 kg/m.  Above B/p discussed with Dr. Sanda Klein. Per Dr. Sanda Klein, she is okay with this B/p given pt complex antihypertensive trials in the past. No changes were made to this pt current regimen.  Advanced Directives 04/20/2018 06/29/2017 12/28/2016 07/07/2016 06/29/2016 06/18/2016  Does Patient Have a Medical Advance Directive? No No No No No No  Would patient like information on creating a medical advance directive? No - Patient declined - - No - patient declined information No - patient declined information No - patient declined information    Current Medications (verified) Outpatient Encounter Medications as of 04/20/2018  Medication Sig  . Bevacizumab (AVASTIN IV) Inject 1.25 mg into the eye every 6 (six) weeks.  . Cholecalciferol (VITAMIN D3) 2000 UNITS TABS Take 2,000 Units by mouth every other day.   . clopidogrel (PLAVIX) 75 MG tablet Take 1 tablet (75 mg total) by mouth daily.  . Cyanocobalamin (VITAMIN B-12) 1000 MCG SUBL Place under the tongue every other day.   . Magnesium 250 MG TABS Take 1 tablet (250 mg total) by mouth every other day.  . Multiple Vitamins-Minerals (ICAPS AREDS 2 PO) Take 1 tablet by mouth 2 (two) times daily.  . rosuvastatin (CRESTOR) 20 MG tablet Take 1 tablet (20 mg total) by mouth daily.  Marland Kitchen triamterene-hydrochlorothiazide (MAXZIDE-25) 37.5-25 MG tablet Take 1 tablet by mouth daily.  Marland Kitchen aspirin EC 81 MG tablet Take 81 mg by mouth daily.  Marland Kitchen co-enzyme Q-10 30 MG  capsule Take 100 mg by mouth 3 (three) times daily.   No facility-administered encounter medications on file as of 04/20/2018.     Allergies (verified) Cozaar [losartan]; Felodipine; Lisinopril; Metoprolol; Sulfur; Neomycin; Nickel; and Penicillins   History: Past Medical History:  Diagnosis Date  . Aortic stenosis   . Atherosclerosis    on Chest CT 08/2014; 70-80% innominate artery, right subclavian artery, 75% celiac artery, 3 vessel coronary artery involvement  . CKD (chronic kidney disease) stage 3, GFR 30-59 ml/min (HCC)   . Coronary atherosclerosis   . History of tobacco use   . Hyperlipidemia   . Hypertension   . IFG (impaired fasting glucose)   . Left ventricular diastolic dysfunction   . Left ventricular hypertrophy   . Mitral valve regurgitation   . Obesity   . PVD (peripheral vascular disease) ([place])   . Renal artery atherosclerosis (HCC)    greater than 60% stenosis right renal artery  . Tricuspid regurgitation   . Vitamin B12 deficiency   . Vitamin D deficiency disease    Past Surgical History:  Procedure Laterality Date  . CAROTID SURGERY  Feb 2004  . CERVICAL DISCECTOMY  1970s  . ILIAC ARTERY STENT  2003  . KIDNEY STENT  April 2016  . LAPAROSCOPY  1970s   Family History  Problem Relation Age of Onset  . Cancer Mother        colon  . Emphysema Father   .  Heart disease Father   . Heart attack Father   . Cancer Sister 29       breast  . Lupus Sister   . Heart disease Sister   . Multiple sclerosis Brother   . Hypertension Sister   . Cancer Daughter        breast  . Cancer Paternal Grandmother        female   Social History   Socioeconomic History  . Marital status: Divorced    Spouse name: Not on file  . Number of children: 3  . Years of education: Not on file  . Highest education level: 11th grade  Occupational History    Employer: RETIRED  Social Needs  . Financial resource strain: Not hard at all  . Food insecurity:    Worry: Never  true    Inability: Never true  . Transportation needs:    Medical: No    Non-medical: No  Tobacco Use  . Smoking status: Former Smoker    Packs/day: 2.00    Years: 64.00    Pack years: 128.00    Types: Cigarettes    Last attempt to quit: 04/14/2014    Years since quitting: 4.0  . Smokeless tobacco: Never Used  . Tobacco comment: smoking cessation materials not required  Substance and Sexual Activity  . Alcohol use: Not Currently  . Drug use: No  . Sexual activity: Not Currently  Lifestyle  . Physical activity:    Days per week: 0 days    Minutes per session: 0 min  . Stress: Not at all  Relationships  . Social connections:    Talks on phone: Patient refused    Gets together: Patient refused    Attends religious service: Patient refused    Active member of club or organization: Patient refused    Attends meetings of clubs or organizations: Patient refused    Relationship status: Patient refused  Other Topics Concern  . Not on file  Social History Narrative  . Not on file    Tobacco Counseling Counseling given: No Comment: smoking cessation materials not required  Clinical Intake:  Pre-visit preparation completed: Yes  Pain : No/denies pain   BMI - recorded: 31.91 Nutritional Status: BMI > 30  Obese Nutritional Risks: None Diabetes: No  How often do you need to have someone help you when you read instructions, pamphlets, or other written materials from your doctor or pharmacy?: 1 - Never  Interpreter Needed?: No  Information entered by :: AE[place]sole, LPN   Activities of Daily Living In your present state of health, do you have any difficulty performing the following activities: 04/20/2018 10/31/2017  Hearing? N N  Comment denies hearing aids -  Vision? N Y  Comment wears eyeglasses -  Difficulty concentrating or making decisions? N N  Walking or climbing stairs? Y N  Comment dyspnea -  Dressing or bathing? N N  Doing errands, shopping? N N  Preparing  Food and eating ? N -  Comment denies dentures -  Using the Toilet? N -  In the past six months, have you accidently leaked urine? Y -  Comment stress incontinence -  Do you have problems with loss of bowel control? N -  Managing your Medications? N -  Managing your Finances? N -  Housekeeping or managing your Housekeeping? N -  Some recent data might be hidden     Immunizations and Health Maintenance Immunization History  Administered Date(s) Administered  . Influenza-Unspecified 06/12/2015,  06/12/2016, 06/09/2017  . Pneumococcal Conjugate-13 03/18/2014  . Pneumococcal Polysaccharide-23 04/30/2002, 09/06/2012   Health Maintenance Due  Topic Date Due  . INFLUENZA VACCINE  04/06/2018    Patient Care Team: Arnetha Courser, MD as PCP - General (Family Medicine) Ubaldo Glassing Javier Docker, MD as Consulting Physician (Cardiology) Schnier, Dolores Lory, MD (Vascular Surgery) Hayden Pedro, MD as Consulting Physician (Ophthalmology)  Indicate any recent Medical Services you may have received from other than [facility] providers in the past year (date may be approximate).     Assessment:   This is a routine wellness examination for [place].  Hearing/Vision screen Vision Screening Comments: Sees Dr. Ellin Mayhew for annual eye exams  Dietary issues and exercise activities discussed: Current Exercise Habits: The patient does not participate in regular exercise at present, Exercise limited by: None identified  Goals    . DIET - INCREASE WATER INTAKE     Recommend to drink at least 6-8 8oz glasses of water per day.      Depression Screen PHQ 2/9 Scores 04/20/2018 10/31/2017 06/29/2017 12/28/2016 06/18/2016 04/24/2015  PHQ - 2 Score 0 0 1 0 0 2  PHQ- 9 Score 0 - - - - -    Fall Risk Fall Risk  04/20/2018 10/31/2017 06/29/2017 12/28/2016 06/18/2016  Falls in the past year? No No No Yes No  Number falls in past yr: - - - 1 -  Injury with Fall? - - - Yes -  Risk for fall due to : Impaired  vision;History of fall(s) - - - -  Risk for fall due to: Comment wears eyeglasses - - - -    FALL RISK PREVENTION PERTAINING TO HOME: Is your home free of loose throw rugs in walkways, pet beds, electrical cords, etc? Yes Is there adequate lighting in your home to reduce risk of falls?  Yes Are there stairs in or around your home WITH handrails? Yes  ASSISTIVE DEVICES UTILIZED TO PREVENT FALLS: Use of a cane, walker or w/c? No Grab bars in the bathroom? Yes  Shower chair or a place to sit while bathing? Yes An elevated toilet seat or a handicapped toilet? Yes  Timed Get Up and Go Performed: Yes. Pt ambulated 10 feet within 20 sec. Gait slow, steady and without the use of an assistive device. No intervention required at this time. Fall risk prevention has been discussed.  Community Resource Referral:  [place] Referral not required at this time.   Cognitive Function:     6CIT Screen 04/20/2018  What Year? 0 points  What month? 0 points  What time? 3 points  Count back from 20 0 points  Months in reverse 0 points  Repeat phrase 0 points  Total Score 3    Screening Tests Health Maintenance  Topic Date Due  . INFLUENZA VACCINE  04/06/2018  . DEXA SCAN  10/31/2018 (Originally 09-01-1935)  . TETANUS/TDAP  10/31/2018 (Originally 04/30/1954)  . PNA vac Low Risk Adult  Completed  . MAMMOGRAM  Discontinued    Qualifies for Shingles Vaccine? Yes. Due for Shingrix. Education has been provided regarding the importance of this vaccine. Pt has been advised to call insurance company to determine out of pocket expense. Advised may also receive vaccine at local pharmacy or Health Dept. Verbalized acceptance and understanding.  Due for Tdap vaccine. Education has been provided regarding the importance of this vaccine. Advised may receive this vaccine at local pharmacy or Health Dept. Aware to provide a copy of the vaccination record  if obtained from local pharmacy or Health Dept.  Verbalized acceptance and understanding.  Cancer Screenings: Lung: Low Dose CT Chest recommended if Age 26-80 years, 30 pack-year currently smoking OR have quit w/in 15years. Patient does not qualify. Breast Screening: Declined Bone Density/Dexa: Declined Colorectal: No loner required  Additional Screenings: Hepatitis C Screening: Does not qualify   Plan:  I have personally reviewed and addressed the Medicare Annual Wellness questionnaire and have noted the following in the patient's chart:  A. Medical and social history B. Use of alcohol, tobacco or illicit drugs  C. Current medications and supplements D. Functional ability and status E.  Nutritional status F.  Physical activity G. Advance directives H. List of other physicians I.  Hospitalizations, surgeries, and ER visits in previous 12 months J.  [place] such as hearing and vision if needed, cognitive and depression L. Referrals and appointments  In addition, I have reviewed and discussed with patient certain preventive protocols, quality metrics, and best practice recommendations. A written personalized care plan for preventive services as well as general preventive health recommendations were provided to patient.  See attached scanned questionnaire for additional information.   Signed,  Aleatha Borer, LPN Nurse Health Advisor

## 2018-04-20 NOTE — Patient Instructions (Signed)
[name] , Thank you for taking time to come for your Medicare Wellness Visit. I appreciate your ongoing commitment to your health goals. Please review the following plan we discussed and let me know if I can assist you in the future.   Screening recommendations/referrals: Colorectal Screening: No longer required Mammogram: Declined Bone Density: Declined  Vision and Dental Exams: Recommended annual ophthalmology exams for early detection of glaucoma and other disorders of the eye Recommended annual dental exams for proper oral hygiene  Vaccinations: Influenza vaccine: Up to date Pneumococcal vaccine: Up to date Tdap vaccine: Declined. Please call your insurance company to determine your out of pocket expense. You may also receive this vaccine at your local pharmacy or Health Dept. Shingles vaccine: Please call your insurance company to determine your out of pocket expense for the Shingrix vaccine. You may also receive this vaccine at your local pharmacy or Health Dept.  Advanced directives: Declined  Goals: Recommend to drink at least 6-8 8oz glasses of water per day.  Next appointment: Please schedule your Annual Wellness Visit with your Nurse Health Advisor in one year.  Preventive Care 16 Years and Older, Female Preventive care refers to lifestyle choices and visits with your health care provider that can promote health and wellness. What does preventive care include?  A yearly physical exam. This is also called an annual well check.  Dental exams once or twice a year.  Routine eye exams. Ask your health care provider how often you should have your eyes checked.  Personal lifestyle choices, including:  Daily care of your teeth and gums.  Regular physical activity.  Eating a healthy diet.  Avoiding tobacco and drug use.  Limiting alcohol use.  Practicing safe sex.  Taking low-dose aspirin every day.  Taking vitamin and mineral supplements as recommended by  your health care provider. What happens during an annual well check? The services and screenings done by your health care provider during your annual well check will depend on your age, overall health, lifestyle risk factors, and family history of disease. Counseling  Your health care provider may ask you questions about your:  Alcohol use.  Tobacco use.  Drug use.  Emotional well-being.  Home and relationship well-being.  Sexual activity.  Eating habits.  History of falls.  Memory and ability to understand (cognition).  Work and work Statistician.  Reproductive health. Screening  You may have the following tests or measurements:  Height, weight, and BMI.  Blood pressure.  Lipid and cholesterol levels. These may be checked every 5 years, or more frequently if you are over 66 years old.  Skin check.  Lung cancer screening. You may have this screening every year starting at age 39 if you have a 30-pack-year history of smoking and currently smoke or have quit within the past 15 years.  Fecal occult blood test (FOBT) of the stool. You may have this test every year starting at age 61.  Flexible sigmoidoscopy or colonoscopy. You may have a sigmoidoscopy every 5 years or a colonoscopy every 10 years starting at age 17.  Hepatitis C blood test.  Hepatitis B blood test.  Sexually transmitted disease (STD) testing.  Diabetes screening. This is done by checking your blood sugar (glucose) after you have not eaten for a while (fasting). You may have this done every 1-3 years.  Bone density scan. This is done to screen for osteoporosis. You may have this done starting at age 63.  Mammogram. This may be done every  1-2 years. Talk to your health care provider about how often you should have regular mammograms. Talk with your health care provider about your test results, treatment options, and if necessary, the need for more tests. Vaccines  Your health care provider may  recommend certain vaccines, such as:  Influenza vaccine. This is recommended every year.  Tetanus, diphtheria, and acellular pertussis (Tdap, Td) vaccine. You may need a Td booster every 10 years.  Zoster vaccine. You may need this after age 21.  Pneumococcal 13-valent conjugate (PCV13) vaccine. One dose is recommended after age 62.  Pneumococcal polysaccharide (PPSV23) vaccine. One dose is recommended after age 76. Talk to your health care provider about which screenings and vaccines you need and how often you need them. This information is not intended to replace advice given to you by your health care provider. Make sure you discuss any questions you have with your health care provider. Document Released: 09/19/2015 Document Revised: 05/12/2016 Document Reviewed: 06/24/2015 Elsevier Interactive Patient Education  2017 [place] Prevention in the Home Falls can cause injuries. They can happen to people of all ages. There are many things you can do to make your home safe and to help prevent falls. What can I do on the outside of my home?  Regularly fix the edges of walkways and driveways and fix any cracks.  Remove anything that might make you trip as you walk through a door, such as a raised step or threshold.  Trim any bushes or trees on the path to your home.  Use bright outdoor lighting.  Clear any walking paths of anything that might make someone trip, such as rocks or tools.  Regularly check to see if handrails are loose or broken. Make sure that both sides of any steps have handrails.  Any raised decks and porches should have guardrails on the edges.  Have any leaves, snow, or ice cleared regularly.  Use sand or salt on walking paths during winter.  Clean up any spills in your garage right away. This includes oil or grease spills. What can I do in the bathroom?  Use night lights.  Install grab bars by the toilet and in the tub and shower. Do not use towel  bars as grab bars.  Use non-skid mats or decals in the tub or shower.  If you need to sit down in the shower, use a plastic, non-slip stool.  Keep the floor dry. Clean up any water that spills on the floor as soon as it happens.  Remove soap buildup in the tub or shower regularly.  Attach bath mats securely with double-sided non-slip rug tape.  Do not have throw rugs and other things on the floor that can make you trip. What can I do in the bedroom?  Use night lights.  Make sure that you have a light by your bed that is easy to reach.  Do not use any sheets or blankets that are too big for your bed. They should not hang down onto the floor.  Have a firm chair that has side arms. You can use this for support while you get dressed.  Do not have throw rugs and other things on the floor that can make you trip. What can I do in the kitchen?  Clean up any spills right away.  Avoid walking on wet floors.  Keep items that you use a lot in easy-to-reach places.  If you need to reach something above you, use a strong  step stool that has a grab bar.  Keep electrical cords out of the way.  Do not use floor polish or wax that makes floors slippery. If you must use wax, use non-skid floor wax.  Do not have throw rugs and other things on the floor that can make you trip. What can I do with my stairs?  Do not leave any items on the stairs.  Make sure that there are handrails on both sides of the stairs and use them. Fix handrails that are broken or loose. Make sure that handrails are as long as the stairways.  Check any carpeting to make sure that it is firmly attached to the stairs. Fix any carpet that is loose or worn.  Avoid having throw rugs at the top or bottom of the stairs. If you do have throw rugs, attach them to the floor with carpet tape.  Make sure that you have a light switch at the top of the stairs and the bottom of the stairs. If you do not have them, ask someone to  add them for you. What else can I do to help prevent falls?  Wear shoes that:  Do not have high heels.  Have rubber bottoms.  Are comfortable and fit you well.  Are closed at the toe. Do not wear sandals.  If you use a stepladder:  Make sure that it is fully opened. Do not climb a closed stepladder.  Make sure that both sides of the stepladder are locked into place.  Ask someone to hold it for you, if possible.  Clearly mark and make sure that you can see:  Any grab bars or handrails.  First and last steps.  Where the edge of each step is.  Use tools that help you move around (mobility aids) if they are needed. These include:  Canes.  Walkers.  Scooters.  Crutches.  Turn on the lights when you go into a dark area. Replace any light bulbs as soon as they burn out.  Set up your furniture so you have a clear path. Avoid moving your furniture around.  If any of your floors are uneven, fix them.  If there are any pets around you, be aware of where they are.  Review your medicines with your doctor. Some medicines can make you feel dizzy. This can increase your chance of falling. Ask your doctor what other things that you can do to help prevent falls. This information is not intended to replace advice given to you by your health care provider. Make sure you discuss any questions you have with your health care provider. Document Released: 06/19/2009 Document Revised: 01/29/2016 Document Reviewed: 09/27/2014 Elsevier Interactive Patient Education  2017 [employer].

## 2018-04-24 ENCOUNTER — Ambulatory Visit: Payer: Medicare Other | Admitting: Family Medicine

## 2018-04-24 ENCOUNTER — Encounter: Payer: Self-pay | Admitting: Family Medicine

## 2018-04-24 ENCOUNTER — Ambulatory Visit (INDEPENDENT_AMBULATORY_CARE_PROVIDER_SITE_OTHER): Payer: Medicare Other | Admitting: Family Medicine

## 2018-04-24 VITALS — BP 142/80 | HR 83 | Temp 98.0°F | Wt 162.1 lb

## 2018-04-24 DIAGNOSIS — N183 Chronic kidney disease, stage 3 unspecified: Secondary | ICD-10-CM

## 2018-04-24 DIAGNOSIS — Z5181 Encounter for therapeutic drug level monitoring: Secondary | ICD-10-CM

## 2018-04-24 DIAGNOSIS — E782 Mixed hyperlipidemia: Secondary | ICD-10-CM

## 2018-04-24 DIAGNOSIS — I2510 Atherosclerotic heart disease of native coronary artery without angina pectoris: Secondary | ICD-10-CM

## 2018-04-24 DIAGNOSIS — E6609 Other obesity due to excess calories: Secondary | ICD-10-CM | POA: Diagnosis not present

## 2018-04-24 DIAGNOSIS — R7301 Impaired fasting glucose: Secondary | ICD-10-CM | POA: Diagnosis not present

## 2018-04-24 DIAGNOSIS — L989 Disorder of the skin and subcutaneous tissue, unspecified: Secondary | ICD-10-CM | POA: Diagnosis not present

## 2018-04-24 DIAGNOSIS — Z6832 Body mass index (BMI) 32.0-32.9, adult: Secondary | ICD-10-CM

## 2018-04-24 DIAGNOSIS — M7552 Bursitis of left shoulder: Secondary | ICD-10-CM

## 2018-04-24 DIAGNOSIS — I10 Essential (primary) hypertension: Secondary | ICD-10-CM | POA: Diagnosis not present

## 2018-04-24 DIAGNOSIS — Z23 Encounter for immunization: Secondary | ICD-10-CM

## 2018-04-24 DIAGNOSIS — I739 Peripheral vascular disease, unspecified: Secondary | ICD-10-CM

## 2018-04-24 DIAGNOSIS — I701 Atherosclerosis of renal artery: Secondary | ICD-10-CM

## 2018-04-24 NOTE — Assessment & Plan Note (Signed)
Managed by vascular  

## 2018-04-24 NOTE — Assessment & Plan Note (Signed)
Try DASH guidelines 

## 2018-04-24 NOTE — Assessment & Plan Note (Signed)
Managed by Dr. Ronalee Belts, vascular; on clopidogrel

## 2018-04-24 NOTE — Patient Instructions (Signed)
Try to limit saturated fats in your diet (bologna, hot dogs, barbeque, cheeseburgers, hamburgers, steak, bacon, sausage, cheese, etc.) and get more fresh fruits, vegetables, and whole grains Let's get labs today Check out the information at familydoctor.org entitled "Nutrition for Weight Loss: What You Need to Know about Fad Diets" Try to lose between 1-2 pounds per week by taking in fewer calories and burning off more calories You can succeed by limiting portions, limiting foods dense in calories and fat, becoming more active, and drinking 8 glasses of water a day (64 ounces) Don't skip meals, especially breakfast, as skipping meals may alter your metabolism Do not use over-the-counter weight loss pills or gimmicks that claim rapid weight loss A healthy BMI (or body mass index) is between 18.5 and 24.9 You can calculate your ideal BMI at the [place] website [employer]fr

## 2018-04-24 NOTE — Assessment & Plan Note (Signed)
Avoiding saturated fats

## 2018-04-24 NOTE — Assessment & Plan Note (Signed)
More mindful shopping and eating

## 2018-04-24 NOTE — Progress Notes (Signed)
BP (!) 142/80 (BP Location: Right Arm)   Pulse 83   Temp 98 F (36.7 C)   Wt 162 lb 1.6 oz (73.5 kg)   SpO2 93%   BMI 31.66 kg/m    Subjective:    Patient ID: Angela Duffy, female    DOB: 03-21-1935, 83 y.o.   MRN: [id number]  HPI: Angela Duffy is a 83 y.o. female  Chief Complaint  Patient presents with  . Follow-up    HPI Patient is here for f/u  She has high blood pressure Initial BP was 140/74, recheck was 142/80; she is pleased with this; has had long hx of HTN  She dropped bowl on the lower left leg; she keeps rubbing the scab off and rubbing it with other leg in her sleep She is having a little shortness of breath; she has ordered a machine that helps with exercising and strengthening her core without having to walk; walking a distance, she'll get pain in her groin and legs from the PAD; no chest pain; just circulation in her legs  She is gaining weight in belly and bra area from the shots in her eyes; that is one of the side effects; she'll lose a pound and gain it back  Aortic stenosis noted in the chart; echo reviewed and no aortic stenosis; just sclerosis; seeing Dr. Ubaldo Glassing; she says it is not aortic stenosis  Arthritis in the left shoulder  Dr. Candiss Norse manages kidneys, CKD stage 3, Dr. Ronalee Belts did the stent in the renal artery  Prediabetes; no dry mouth; drinking lots of Gatorade zero without sugar; she is craving sweets so trying to really watch it; buying frozen fruit, berries; A1c was 5.8  High cholesterol; avoiding beef and other red meat; sausage every 3 months; loves veggies; chicken and fish; last LDL 117  Depression screen [facility] 2/9 04/24/2018 04/20/2018 10/31/2017 06/29/2017 12/28/2016  Decreased Interest 0 0 0 0 0  Down, Depressed, Hopeless 0 0 0 1 0  PHQ - 2 Score 0 0 0 1 0  Altered sleeping - 0 - - -  Tired, decreased energy - 0 - - -  Change in appetite - 0 - - -  Feeling bad or failure about yourself  - 0 - - -  Trouble concentrating  - 0 - - -  Moving slowly or fidgety/restless - 0 - - -  Suicidal thoughts - 0 - - -  PHQ-9 Score - 0 - - -  Difficult doing work/chores - Not difficult at all - - -    Relevant past medical, surgical, family and social history reviewed Past Medical History:  Diagnosis Date  . Atherosclerosis    on Chest CT 08/2014; 70-80% innominate artery, right subclavian artery, 75% celiac artery, 3 vessel coronary artery involvement  . CKD (chronic kidney disease) stage 3, GFR 30-59 ml/min (HCC)   . Coronary atherosclerosis   . History of tobacco use   . Hyperlipidemia   . Hypertension   . IFG (impaired fasting glucose)   . Left ventricular diastolic dysfunction   . Left ventricular hypertrophy   . Mitral valve regurgitation   . Obesity   . PVD (peripheral vascular disease) ([place])   . Renal artery atherosclerosis (HCC)    greater than 60% stenosis right renal artery  . Tricuspid regurgitation   . Vitamin B12 deficiency   . Vitamin D deficiency disease    Past Surgical History:  Procedure Laterality Date  . CAROTID SURGERY  Feb 2004  .  CERVICAL DISCECTOMY  1970s  . ILIAC ARTERY STENT  2003  . KIDNEY STENT  April 2016  . LAPAROSCOPY  1970s   Family History  Problem Relation Age of Onset  . Cancer Mother        colon  . Emphysema Father   . Heart disease Father   . Heart attack Father   . Cancer Sister 37       breast  . Lupus Sister   . Heart disease Sister   . Multiple sclerosis Brother   . Hypertension Sister   . Cancer Daughter        breast  . Cancer Paternal Grandmother        female   Social History   Tobacco Use  . Smoking status: Former Smoker    Packs/day: 2.00    Years: 64.00    Pack years: 128.00    Types: Cigarettes    Last attempt to quit: 04/14/2014    Years since quitting: 4.0  . Smokeless tobacco: Never Used  . Tobacco comment: smoking cessation materials not required  Substance Use Topics  . Alcohol use: Not Currently  . Drug use: No    Interim  medical history since last visit reviewed. Allergies and medications reviewed  Review of Systems Per HPI unless specifically indicated above     Objective:    BP (!) 142/80 (BP Location: Right Arm)   Pulse 83   Temp 98 F (36.7 C)   Wt 162 lb 1.6 oz (73.5 kg)   SpO2 93%   BMI 31.66 kg/m   Wt Readings from Last 3 Encounters:  04/24/18 162 lb 1.6 oz (73.5 kg)  04/20/18 163 lb 6.4 oz (74.1 kg)  10/31/17 164 lb 11.2 oz (74.7 kg)    Physical Exam  Constitutional: She appears well-developed and well-nourished. No distress.  HENT:  Head: Normocephalic and atraumatic.  Eyes: EOM are normal. No scleral icterus.  Neck: No thyromegaly present.  Cardiovascular: Normal rate, regular rhythm and normal heart sounds.  No murmur heard. Pulmonary/Chest: Effort normal and breath sounds normal. No respiratory distress. She has no wheezes.  Abdominal: Soft. Bowel sounds are normal. She exhibits no distension.  Musculoskeletal: She exhibits no edema.  Neurological: She is alert.  Skin: Skin is warm and dry. She is not diaphoretic. No pallor.     Scab on the distal shin LEFT leg; mild erythema around lesion  Psychiatric: She has a normal mood and affect. Her behavior is normal. Judgment and thought content normal.    Results for orders placed or performed in visit on 10/31/17  CBC with Differential/Platelet  Result Value Ref Range   WBC 4.6 3.8 - 10.8 Thousand/uL   RBC 4.74 3.80 - 5.10 Million/uL   Hemoglobin 13.1 11.7 - 15.5 g/dL   HCT 39.2 35.0 - 45.0 %   MCV 82.7 80.0 - 100.0 fL   MCH 27.6 27.0 - 33.0 pg   MCHC 33.4 32.0 - 36.0 g/dL   RDW 15.0 11.0 - 15.0 %   Platelets 203 140 - 400 Thousand/uL   MPV 11.7 7.5 - 12.5 fL   Neutro Abs 3,441 1,500 - 7,800 cells/uL   Lymphs Abs 667 (L) 850 - 3,900 cells/uL   WBC mixed population 446 200 - 950 cells/uL   Eosinophils Absolute 18 15 - 500 cells/uL   Basophils Absolute 28 0 - 200 cells/uL   Neutrophils Relative % 74.8 %   Total  Lymphocyte 14.5 %   Monocytes Relative 9.7 %  Eosinophils Relative 0.4 %   Basophils Relative 0.6 %  COMPLETE METABOLIC PANEL WITH GFR  Result Value Ref Range   Glucose, Bld 109 (H) 65 - 99 mg/dL   BUN 18 7 - 25 mg/dL   Creat 0.86 0.60 - 0.88 mg/dL   GFR, Est Non African American 64 > OR = 60 mL/min/1.46m2   GFR, Est African American 74 > OR = 60 mL/min/1.48m2   BUN/Creatinine Ratio NOT APPLICABLE 6 - 22 (calc)   Sodium 142 135 - 146 mmol/L   Potassium 3.9 3.5 - 5.3 mmol/L   Chloride 102 98 - 110 mmol/L   CO2 31 20 - 32 mmol/L   Calcium 9.8 8.6 - 10.4 mg/dL   Total Protein 6.9 6.1 - 8.1 g/dL   Albumin 4.3 3.6 - 5.1 g/dL   Globulin 2.6 1.9 - 3.7 g/dL (calc)   AG Ratio 1.7 1.0 - 2.5 (calc)   Total Bilirubin 0.6 0.2 - 1.2 mg/dL   Alkaline phosphatase (APISO) 56 33 - 130 U/L   AST 25 10 - 35 U/L   ALT 19 6 - 29 U/L  Hemoglobin A1c  Result Value Ref Range   Hgb A1c MFr Bld 5.8 (H) <5.7 % of total Hgb   Mean Plasma Glucose 120 (calc)   eAG (mmol/L) 6.6 (calc)  Lipid panel  Result Value Ref Range   Cholesterol 200 (H) <200 mg/dL   HDL 59 >50 mg/dL   Triglycerides 127 <150 mg/dL   LDL Cholesterol (Calc) 117 (H) mg/dL (calc)   Total CHOL/HDL Ratio 3.4 <5.0 (calc)   Non-HDL Cholesterol (Calc) 141 (H) <130 mg/dL (calc)  TSH  Result Value Ref Range   TSH 2.53 0.40 - 4.50 mIU/L      Assessment & Plan:   Problem List Items Addressed This Visit      Cardiovascular and Mediastinum   Renal artery atherosclerosis (HCC)    Managed by vascular      PVD (peripheral vascular disease) ([place])    Managed by Dr. Ronalee Belts, vascular; on clopidogrel      Hypertension (Chronic)    Try DASH guidelines      Coronary atherosclerosis    Goal LDL less than 70; seeing Dr. Ubaldo Glassing; no chest pain      Relevant Orders   Lipid panel     Endocrine   IFG (impaired fasting glucose) (Chronic)    Avoiding sugary drinks; check A1c      Relevant Orders   Hemoglobin A1c     Musculoskeletal and  Integument   Bursitis of left shoulder    Chronic, unchanged        Genitourinary   CKD (chronic kidney disease) stage 3, GFR 30-59 ml/min (HCC) (Chronic)    Managed by Dr. Candiss Norse; avoiding NSAIDs        Other   Medication monitoring encounter   Relevant Orders   CBC with Differential/Platelet   COMPLETE METABOLIC PANEL WITH GFR   Obesity    More mindful shopping and eating      Hyperlipidemia    Avoiding saturated fats       Other Visit Diagnoses    Non-healing skin lesion    -  Primary   Relevant Orders   Ambulatory referral to Dermatology   Tdap vaccine greater than or equal to 7yo IM (Completed)       Follow up plan: Return in about 6 months (around 10/25/2018) for follow-up visit with Dr. Sanda Klein.  An after-visit summary was printed and  given to the patient at [place].  Please see the patient instructions which may contain other information and recommendations beyond what is mentioned above in the assessment and plan.  No orders of the defined types were placed in this encounter.   Orders Placed This Encounter  Procedures  . Tdap vaccine greater than or equal to 7yo IM  . CBC with Differential/Platelet  . COMPLETE METABOLIC PANEL WITH GFR  . Hemoglobin A1c  . Lipid panel  . Ambulatory referral to Dermatology

## 2018-04-24 NOTE — Assessment & Plan Note (Signed)
Managed by Dr. Candiss Norse; avoiding NSAIDs

## 2018-04-24 NOTE — Assessment & Plan Note (Signed)
Chronic,unchanged.

## 2018-04-24 NOTE — Assessment & Plan Note (Signed)
Goal LDL less than 70; seeing Dr. Ubaldo Glassing; no chest pain

## 2018-04-24 NOTE — Assessment & Plan Note (Signed)
Avoiding sugary drinks; check A1c

## 2018-04-25 LAB — LIPID PANEL
Cholesterol: 174 mg/dL (ref <200)
HDL: 56 mg/dL (ref >50)
LDL Cholesterol (Calc): 100 mg/dL (calc) — ABNORMAL HIGH
Non-HDL Cholesterol (Calc): 118 mg/dL (calc) (ref <130)
Total CHOL/HDL Ratio: 3.1 (calc) (ref <5.0)
Triglycerides: 90 mg/dL (ref <150)

## 2018-04-25 LAB — HEMOGLOBIN A1C
HEMOGLOBIN A1C: 5.9 %{Hb} — AB (ref <5.7)
Mean Plasma Glucose: 123 (calc)
eAG (mmol/L): 6.8 (calc)

## 2018-04-25 LAB — CBC WITH DIFFERENTIAL/PLATELET
BASOS ABS: 32 {cells}/uL (ref 0–200)
BASOS PCT: 0.6 %
EOS ABS: 21 {cells}/uL (ref 15–500)
Eosinophils Relative: 0.4 %
HCT: 37.5 % (ref 35.0–45.0)
Hemoglobin: 12.3 g/dL (ref 11.7–15.5)
Lymphs Abs: 583 cells/uL — ABNORMAL LOW (ref 850–3900)
MCH: 27.6 pg (ref 27.0–33.0)
MCHC: 32.8 g/dL (ref 32.0–36.0)
MCV: 84.1 fL (ref 80.0–100.0)
MPV: 12 fL (ref 7.5–12.5)
Monocytes Relative: 8.7 %
Neutro Abs: 4203 cells/uL (ref 1500–7800)
Neutrophils Relative %: 79.3 %
PLATELETS: 194 10*3/uL (ref 140–400)
RBC: 4.46 10*6/uL (ref 3.80–5.10)
RDW: 14.3 % (ref 11.0–15.0)
TOTAL LYMPHOCYTE: 11 %
WBC: 5.3 10*3/uL (ref 3.8–10.8)
WBCMIX: 461 {cells}/uL (ref 200–950)

## 2018-04-25 LAB — COMPLETE METABOLIC PANEL WITH GFR
AG RATIO: 1.7 (calc) (ref 1.0–2.5)
ALBUMIN MSPROF: 4.3 g/dL (ref 3.6–5.1)
ALKALINE PHOSPHATASE (APISO): 56 U/L (ref 33–130)
ALT: 16 U/L (ref 6–29)
AST: 21 U/L (ref 10–35)
BILIRUBIN TOTAL: 0.6 mg/dL (ref 0.2–1.2)
BUN/Creatinine Ratio: 22 (calc) (ref 6–22)
BUN: 20 mg/dL (ref 7–25)
CHLORIDE: 101 mmol/L (ref 98–110)
CO2: 31 mmol/L (ref 20–32)
CREATININE: 0.89 mg/dL — AB (ref 0.60–0.88)
Calcium: 9.6 mg/dL (ref 8.6–10.4)
GFR, Est African American: 71 mL/min/{1.73_m2} (ref >=60)
GFR, Est Non African American: 61 mL/min/{1.73_m2} (ref >=60)
GLOBULIN: 2.5 g/dL (ref 1.9–3.7)
Glucose, Bld: 126 mg/dL — ABNORMAL HIGH (ref 65–99)
Potassium: 3.9 mmol/L (ref 3.5–5.3)
SODIUM: 142 mmol/L (ref 135–146)
Total Protein: 6.8 g/dL (ref 6.1–8.1)

## 2018-04-28 ENCOUNTER — Encounter (INDEPENDENT_AMBULATORY_CARE_PROVIDER_SITE_OTHER): Payer: Medicare Other | Admitting: Ophthalmology

## 2018-04-28 ENCOUNTER — Ambulatory Visit: Payer: Medicare Other | Admitting: Family Medicine

## 2018-04-28 DIAGNOSIS — I10 Essential (primary) hypertension: Secondary | ICD-10-CM

## 2018-04-28 DIAGNOSIS — H35033 Hypertensive retinopathy, bilateral: Secondary | ICD-10-CM

## 2018-04-28 DIAGNOSIS — H353231 Exudative age-related macular degeneration, bilateral, with active choroidal neovascularization: Secondary | ICD-10-CM | POA: Diagnosis not present

## 2018-04-28 DIAGNOSIS — D3131 Benign neoplasm of right choroid: Secondary | ICD-10-CM | POA: Diagnosis not present

## 2018-04-28 DIAGNOSIS — H43813 Vitreous degeneration, bilateral: Secondary | ICD-10-CM | POA: Diagnosis not present

## 2018-05-25 DIAGNOSIS — Z23 Encounter for immunization: Secondary | ICD-10-CM | POA: Diagnosis not present

## 2018-06-09 ENCOUNTER — Encounter (INDEPENDENT_AMBULATORY_CARE_PROVIDER_SITE_OTHER): Payer: Medicare Other | Admitting: Ophthalmology

## 2018-06-09 DIAGNOSIS — D3131 Benign neoplasm of right choroid: Secondary | ICD-10-CM | POA: Diagnosis not present

## 2018-06-09 DIAGNOSIS — H35033 Hypertensive retinopathy, bilateral: Secondary | ICD-10-CM | POA: Diagnosis not present

## 2018-06-09 DIAGNOSIS — H43813 Vitreous degeneration, bilateral: Secondary | ICD-10-CM

## 2018-06-09 DIAGNOSIS — I10 Essential (primary) hypertension: Secondary | ICD-10-CM

## 2018-06-09 DIAGNOSIS — H353231 Exudative age-related macular degeneration, bilateral, with active choroidal neovascularization: Secondary | ICD-10-CM | POA: Diagnosis not present

## 2018-06-19 ENCOUNTER — Other Ambulatory Visit: Payer: Self-pay | Admitting: Family Medicine

## 2018-07-28 ENCOUNTER — Encounter (INDEPENDENT_AMBULATORY_CARE_PROVIDER_SITE_OTHER): Payer: Medicare Other | Admitting: Ophthalmology

## 2018-07-28 DIAGNOSIS — D3131 Benign neoplasm of right choroid: Secondary | ICD-10-CM | POA: Diagnosis not present

## 2018-07-28 DIAGNOSIS — H35033 Hypertensive retinopathy, bilateral: Secondary | ICD-10-CM | POA: Diagnosis not present

## 2018-07-28 DIAGNOSIS — H353231 Exudative age-related macular degeneration, bilateral, with active choroidal neovascularization: Secondary | ICD-10-CM

## 2018-07-28 DIAGNOSIS — I10 Essential (primary) hypertension: Secondary | ICD-10-CM | POA: Diagnosis not present

## 2018-07-28 DIAGNOSIS — H43813 Vitreous degeneration, bilateral: Secondary | ICD-10-CM | POA: Diagnosis not present

## 2018-09-22 ENCOUNTER — Encounter (INDEPENDENT_AMBULATORY_CARE_PROVIDER_SITE_OTHER): Payer: Medicare Other | Admitting: Ophthalmology

## 2018-09-22 DIAGNOSIS — H43813 Vitreous degeneration, bilateral: Secondary | ICD-10-CM | POA: Diagnosis not present

## 2018-09-22 DIAGNOSIS — H353231 Exudative age-related macular degeneration, bilateral, with active choroidal neovascularization: Secondary | ICD-10-CM | POA: Diagnosis not present

## 2018-09-22 DIAGNOSIS — H35033 Hypertensive retinopathy, bilateral: Secondary | ICD-10-CM | POA: Diagnosis not present

## 2018-09-22 DIAGNOSIS — I10 Essential (primary) hypertension: Secondary | ICD-10-CM | POA: Diagnosis not present

## 2018-10-25 ENCOUNTER — Ambulatory Visit: Payer: Medicare Other | Admitting: Family Medicine

## 2018-11-17 ENCOUNTER — Other Ambulatory Visit: Payer: Self-pay

## 2018-11-17 ENCOUNTER — Encounter (INDEPENDENT_AMBULATORY_CARE_PROVIDER_SITE_OTHER): Payer: Medicare Other | Admitting: Ophthalmology

## 2018-11-17 DIAGNOSIS — H35033 Hypertensive retinopathy, bilateral: Secondary | ICD-10-CM | POA: Diagnosis not present

## 2018-11-17 DIAGNOSIS — H43813 Vitreous degeneration, bilateral: Secondary | ICD-10-CM | POA: Diagnosis not present

## 2018-11-17 DIAGNOSIS — I10 Essential (primary) hypertension: Secondary | ICD-10-CM | POA: Diagnosis not present

## 2018-11-17 DIAGNOSIS — H353231 Exudative age-related macular degeneration, bilateral, with active choroidal neovascularization: Secondary | ICD-10-CM | POA: Diagnosis not present

## 2018-11-17 DIAGNOSIS — D3131 Benign neoplasm of right choroid: Secondary | ICD-10-CM | POA: Diagnosis not present

## 2018-12-04 ENCOUNTER — Ambulatory Visit: Payer: Medicare Other | Admitting: Family Medicine

## 2019-01-17 ENCOUNTER — Other Ambulatory Visit: Payer: Self-pay | Admitting: Family Medicine

## 2019-01-17 NOTE — Telephone Encounter (Signed)
lvm asking patient to schedule appt

## 2019-01-17 NOTE — Telephone Encounter (Signed)
Patient needs appointment.

## 2019-01-19 ENCOUNTER — Encounter (INDEPENDENT_AMBULATORY_CARE_PROVIDER_SITE_OTHER): Payer: Medicare Other | Admitting: Ophthalmology

## 2019-01-19 ENCOUNTER — Other Ambulatory Visit: Payer: Self-pay

## 2019-01-19 DIAGNOSIS — H35033 Hypertensive retinopathy, bilateral: Secondary | ICD-10-CM | POA: Diagnosis not present

## 2019-01-19 DIAGNOSIS — H43813 Vitreous degeneration, bilateral: Secondary | ICD-10-CM | POA: Diagnosis not present

## 2019-01-19 DIAGNOSIS — H353231 Exudative age-related macular degeneration, bilateral, with active choroidal neovascularization: Secondary | ICD-10-CM | POA: Diagnosis not present

## 2019-01-19 DIAGNOSIS — D3131 Benign neoplasm of right choroid: Secondary | ICD-10-CM

## 2019-01-19 DIAGNOSIS — I10 Essential (primary) hypertension: Secondary | ICD-10-CM

## 2019-02-05 ENCOUNTER — Encounter: Payer: Self-pay | Admitting: Nurse Practitioner

## 2019-02-05 ENCOUNTER — Ambulatory Visit (INDEPENDENT_AMBULATORY_CARE_PROVIDER_SITE_OTHER): Payer: Medicare Other | Admitting: Nurse Practitioner

## 2019-02-05 ENCOUNTER — Other Ambulatory Visit: Payer: Self-pay

## 2019-02-05 VITALS — BP 160/72 | HR 85 | Temp 98.4°F | Resp 12 | Ht 60.0 in | Wt 149.7 lb

## 2019-02-05 DIAGNOSIS — I739 Peripheral vascular disease, unspecified: Secondary | ICD-10-CM

## 2019-02-05 DIAGNOSIS — N183 Chronic kidney disease, stage 3 unspecified: Secondary | ICD-10-CM

## 2019-02-05 DIAGNOSIS — H3530 Unspecified macular degeneration: Secondary | ICD-10-CM | POA: Diagnosis not present

## 2019-02-05 DIAGNOSIS — I701 Atherosclerosis of renal artery: Secondary | ICD-10-CM | POA: Diagnosis not present

## 2019-02-05 DIAGNOSIS — R7303 Prediabetes: Secondary | ICD-10-CM | POA: Diagnosis not present

## 2019-02-05 DIAGNOSIS — E782 Mixed hyperlipidemia: Secondary | ICD-10-CM

## 2019-02-05 DIAGNOSIS — I10 Essential (primary) hypertension: Secondary | ICD-10-CM | POA: Diagnosis not present

## 2019-02-05 DIAGNOSIS — Z5181 Encounter for therapeutic drug level monitoring: Secondary | ICD-10-CM

## 2019-02-05 DIAGNOSIS — D692 Other nonthrombocytopenic purpura: Secondary | ICD-10-CM | POA: Diagnosis not present

## 2019-02-05 MED ORDER — CLOPIDOGREL BISULFATE 75 MG PO TABS: 75.0000 mg | ORAL_TABLET | Freq: Every day | ORAL | 1 refills | Status: DC

## 2019-02-05 MED ORDER — TRIAMTERENE-HCTZ 37.5-25 MG PO TABS: 1.0000 | ORAL_TABLET | Freq: Every day | ORAL | 1 refills | Status: DC

## 2019-02-05 MED ORDER — ROSUVASTATIN CALCIUM 20 MG PO TABS: 20.0000 mg | ORAL_TABLET | Freq: Every day | ORAL | 1 refills | Status: DC

## 2019-02-05 NOTE — Progress Notes (Signed)
Name: Angela Duffy   MRN: [id number]    DOB: February 20, 1935   Date:02/05/2019       Progress Note  Subjective  Chief Complaint  Chief Complaint  Patient presents with  . Medication Refill    HPI  Hypertension Patient is prescribed maxide 25-37.5mg . States she did not take it today because she didn't want to have to go to the bathroom so frequently. Patient additionally notes she has white coat syndrome and is stressed because she lost her glasses and wasn't sure if she would make it on time.  She has some left ventricular Hypertrophy   Patient has seen Dr. Ubaldo Glassing- Cardiologist- last follow-up noted was in 2016 unsure why patient did not continue follow-up with specialty. At that time she was rx maxide and clonidine 0.1mg  BID BP Readings from Last 3 Encounters:  02/05/19 (!) 160/72  04/24/18 (!) 142/80  04/20/18 (!) 160/72     PVD (peripheral vascular disease)  Patient sees Dr. Delana Meyer- vascular. She is prescribed plavix 75,g and aspirin 81mg . She is on a statin. She has had a carotid endarterectomy and iliac stenting in [facility]  CKD (chronic kidney disease) stage 3, GFR 30-59 ml/min (HCC) Stent placed for renal artery. Drinks lots of water and occasionally a Gatorade zero. States she does take aleve for severe pain- maybe every 3-6 months- will take acetaminophen if needed for minor aches and pains.  Lab Results  Component Value Date   CREATININE 0.89 (H) 04/24/2018    Mixed hyperlipidemia Patient is prescribed rosuvastatin 20mg  daily & coenzyme Q10  Lab Results  Component Value Date   CHOL 174 04/24/2018   HDL 56 04/24/2018   LDLCALC 100 (H) 04/24/2018   TRIG 90 04/24/2018   CHOLHDL 3.1 04/24/2018    Prediabetes States has been eating much better since the pandemic, has been working on eating a lot more vegetables and fruits and has been losing weight with healthy eating.  Wt Readings from Last 3 Encounters:  02/05/19 149 lb 11.2 oz (67.9 kg)  04/24/18 162 lb 1.6  oz (73.5 kg)  04/20/18 163 lb 6.4 oz (74.1 kg)     PHQ2/9: Depression screen [facility] 2/9 02/05/2019 04/24/2018 04/20/2018 10/31/2017 06/29/2017  Decreased Interest 0 0 0 0 0  Down, Depressed, Hopeless 0 0 0 0 1  PHQ - 2 Score 0 0 0 0 1  Altered sleeping 0 - 0 - -  Tired, decreased energy 0 - 0 - -  Change in appetite 0 - 0 - -  Feeling bad or failure about yourself  0 - 0 - -  Trouble concentrating 0 - 0 - -  Moving slowly or fidgety/restless 0 - 0 - -  Suicidal thoughts 0 - 0 - -  PHQ-9 Score 0 - 0 - -  Difficult doing work/chores Not difficult at all - Not difficult at all - -     PHQ reviewed. Negative  Patient Active Problem List   Diagnosis Date Noted  . Macular degeneration 02/05/2019  . Full code status 06/29/2017  . Hypokalemia 09/29/2016  . Medication monitoring encounter 09/25/2015  . Bursitis of left shoulder 07/26/2015  . Microalbuminuria 04/24/2015  . IFG (impaired fasting glucose)   . Hyperlipidemia   . Hypertension   . Obesity   . PVD (peripheral vascular disease) ([place])   . History of tobacco use   . CKD (chronic kidney disease) stage 3, GFR 30-59 ml/min (HCC)   . Left ventricular diastolic dysfunction   .  Left ventricular hypertrophy   . Mitral valve regurgitation   . Tricuspid regurgitation   . Coronary atherosclerosis   . Atherosclerosis   . Renal artery atherosclerosis (HCC)     Past Medical History:  Diagnosis Date  . Atherosclerosis    on Chest CT 08/2014; 70-80% innominate artery, right subclavian artery, 75% celiac artery, 3 vessel coronary artery involvement  . CKD (chronic kidney disease) stage 3, GFR 30-59 ml/min (HCC)   . Coronary atherosclerosis   . History of tobacco use   . Hyperlipidemia   . Hypertension   . IFG (impaired fasting glucose)   . Left ventricular diastolic dysfunction   . Left ventricular hypertrophy   . Mitral valve regurgitation   . Obesity   . PVD (peripheral vascular disease) ([place])   . Renal artery atherosclerosis  (HCC)    greater than 60% stenosis right renal artery  . Tricuspid regurgitation   . Vitamin B12 deficiency   . Vitamin D deficiency disease     Past Surgical History:  Procedure Laterality Date  . CAROTID SURGERY  Feb 2004  . CERVICAL DISCECTOMY  1970s  . ILIAC ARTERY STENT  2003  . KIDNEY STENT  April 2016  . LAPAROSCOPY  1970s    Social History   Tobacco Use  . Smoking status: Former Smoker    Packs/day: 2.00    Years: 64.00    Pack years: 128.00    Types: Cigarettes    Last attempt to quit: 04/14/2014    Years since quitting: 4.8  . Smokeless tobacco: Never Used  . Tobacco comment: smoking cessation materials not required  Substance Use Topics  . Alcohol use: Not Currently     Current Outpatient Medications:  .  Bevacizumab (AVASTIN IV), Inject 1.25 mg into the eye every 6 (six) weeks., Disp: , Rfl:  .  clopidogrel (PLAVIX) 75 MG tablet, Take 1 tablet (75 mg total) by mouth daily., Disp: 90 tablet, Rfl: 1 .  Cyanocobalamin (VITAMIN B-12) 1000 MCG SUBL, Place under the tongue every other day. , Disp: , Rfl:  .  Magnesium 250 MG TABS, Take 1 tablet (250 mg total) by mouth every other day., Disp: , Rfl:  .  Multiple Vitamins-Minerals (ICAPS AREDS 2 PO), Take 1 tablet by mouth 2 (two) times daily., Disp: , Rfl:  .  rosuvastatin (CRESTOR) 20 MG tablet, Take 1 tablet (20 mg total) by mouth daily., Disp: 90 tablet, Rfl: 1 .  triamterene-hydrochlorothiazide (MAXZIDE-25) 37.5-25 MG tablet, Take 1 tablet by mouth daily., Disp: 90 tablet, Rfl: 1 .  aspirin EC 81 MG tablet, Take 81 mg by mouth daily., Disp: , Rfl:  .  Cholecalciferol (VITAMIN D3) 2000 UNITS TABS, Take 2,000 Units by mouth every other day. , Disp: , Rfl:  .  co-enzyme Q-10 30 MG capsule, Take 100 mg by mouth 3 (three) times daily., Disp: , Rfl:   Allergies  Allergen Reactions  . Cozaar [Losartan] Other (See Comments)    cramps  . Felodipine Swelling  . Lisinopril Other (See Comments)  . Metoprolol Swelling  .  Sulfur Diarrhea and Nausea And Vomiting  . Neomycin Rash  . Nickel Rash  . Penicillins Rash    Review of Systems  Constitutional: Negative for chills, fever and malaise/fatigue.  Eyes: Negative for blurred vision and double vision.  Respiratory: Negative for cough and shortness of breath.   Cardiovascular: Negative for chest pain, palpitations and leg swelling.  Gastrointestinal: Negative for abdominal pain.  Musculoskeletal: Negative for joint  pain and myalgias.  Skin: Negative for rash.  Neurological: Negative for dizziness and headaches.  Psychiatric/Behavioral: The patient is not nervous/anxious and does not have insomnia.      No other specific complaints in a complete review of systems (except as listed in HPI above).  Objective  Vitals:   02/05/19 0957 02/05/19 1034  BP: (!) 180/78 (!) 160/72  Pulse: 85   Resp: 12   Temp: 98.4 F (36.9 C)   TempSrc: Oral   SpO2: 94%   Weight: 149 lb 11.2 oz (67.9 kg)   Height: 5' (1.524 m)      Body mass index is 29.24 kg/m.  Nursing Note and Vital Signs reviewed.  Physical Exam Vitals signs reviewed.  Constitutional:      Appearance: She is well-developed.  HENT:     Head: Normocephalic and atraumatic.  Neck:     Musculoskeletal: Normal range of motion and neck supple.     Vascular: No carotid bruit.  Cardiovascular:     Heart sounds: Normal heart sounds.  Pulmonary:     Effort: Pulmonary effort is normal.     Breath sounds: Normal breath sounds.  Abdominal:     General: Bowel sounds are normal.     Palpations: Abdomen is soft.     Tenderness: There is no abdominal tenderness.  Musculoskeletal: Normal range of motion.  Skin:    General: Skin is warm and dry.     Capillary Refill: Capillary refill takes less than 2 seconds.  Neurological:     Mental Status: She is alert and oriented to person, place, and time.     GCS: GCS eye subscore is 4. GCS verbal subscore is 5. GCS motor subscore is 6.     Sensory: No  sensory deficit.  Psychiatric:        Speech: Speech normal.        Behavior: Behavior normal.        Thought Content: Thought content normal.        Judgment: Judgment normal.       No results found for this or any previous visit (from the past 48 hour(s)).  Assessment & Plan  1. Essential hypertension Elevated; will have patient take medicine and recheck in one week.  - COMPLETE METABOLIC PANEL WITH GFR - triamterene-hydrochlorothiazide (MAXZIDE-25) 37.5-25 MG tablet; Take 1 tablet by mouth daily.  Dispense: 90 tablet; Refill: 1  2. PVD (peripheral vascular disease) ([place]) States she stopped seeing vascular, does not want to go back at this time.  - Lipid Profile - rosuvastatin (CRESTOR) 20 MG tablet; Take 1 tablet (20 mg total) by mouth daily.  Dispense: 90 tablet; Refill: 1  3. Renal artery atherosclerosis (HCC) LDL goal under 70  - Lipid Profile - rosuvastatin (CRESTOR) 20 MG tablet; Take 1 tablet (20 mg total) by mouth daily.  Dispense: 90 tablet; Refill: 1 - clopidogrel (PLAVIX) 75 MG tablet; Take 1 tablet (75 mg total) by mouth daily.  Dispense: 90 tablet; Refill: 1  4. CKD (chronic kidney disease) stage 3, GFR 30-59 ml/min (HCC) Hydration avoid NSAIDs - COMPLETE METABOLIC PANEL WITH GFR  5. Mixed hyperlipidemia - Lipid Profile - rosuvastatin (CRESTOR) 20 MG tablet; Take 1 tablet (20 mg total) by mouth daily.  Dispense: 90 tablet; Refill: 1  6. Prediabetes - HgB A1c  7. Medication monitoring encounter - COMPLETE METABOLIC PANEL WITH GFR  8. Senile purpura ([place]) Reassurance

## 2019-02-06 LAB — COMPLETE METABOLIC PANEL WITH GFR
AG Ratio: 1.8 (calc) (ref 1.0–2.5)
ALT: 15 U/L (ref 6–29)
AST: 24 U/L (ref 10–35)
Albumin: 4.4 g/dL (ref 3.6–5.1)
Alkaline phosphatase (APISO): 53 U/L (ref 37–153)
BUN/Creatinine Ratio: 14 (calc) (ref 6–22)
BUN: 15 mg/dL (ref 7–25)
CO2: 26 mmol/L (ref 20–32)
Calcium: 9.6 mg/dL (ref 8.6–10.4)
Chloride: 101 mmol/L (ref 98–110)
Creat: 1.06 mg/dL — ABNORMAL HIGH (ref 0.60–0.88)
GFR, Est African American: 57 mL/min/{1.73_m2} — ABNORMAL LOW (ref >=60)
GFR, Est Non African American: 49 mL/min/{1.73_m2} — ABNORMAL LOW (ref >=60)
Globulin: 2.4 g/dL (calc) (ref 1.9–3.7)
Glucose, Bld: 111 mg/dL — ABNORMAL HIGH (ref 65–99)
Potassium: 3.5 mmol/L (ref 3.5–5.3)
Sodium: 140 mmol/L (ref 135–146)
Total Bilirubin: 0.7 mg/dL (ref 0.2–1.2)
Total Protein: 6.8 g/dL (ref 6.1–8.1)

## 2019-02-06 LAB — LIPID PANEL
Cholesterol: 168 mg/dL (ref <200)
HDL: 53 mg/dL (ref >=50)
LDL Cholesterol (Calc): 94 mg/dL (calc)
Non-HDL Cholesterol (Calc): 115 mg/dL (calc) (ref <130)
Total CHOL/HDL Ratio: 3.2 (calc) (ref <5.0)
Triglycerides: 112 mg/dL (ref <150)

## 2019-02-06 LAB — HEMOGLOBIN A1C
Hgb A1c MFr Bld: 5.7 % of total Hgb — ABNORMAL HIGH (ref <5.7)
Mean Plasma Glucose: 117 (calc)
eAG (mmol/L): 6.5 (calc)

## 2019-02-12 ENCOUNTER — Ambulatory Visit: Payer: Medicare Other

## 2019-03-15 DIAGNOSIS — H353131 Nonexudative age-related macular degeneration, bilateral, early dry stage: Secondary | ICD-10-CM | POA: Diagnosis not present

## 2019-03-15 DIAGNOSIS — H401131 Primary open-angle glaucoma, bilateral, mild stage: Secondary | ICD-10-CM | POA: Diagnosis not present

## 2019-04-13 ENCOUNTER — Other Ambulatory Visit: Payer: Self-pay

## 2019-04-13 ENCOUNTER — Encounter (INDEPENDENT_AMBULATORY_CARE_PROVIDER_SITE_OTHER): Payer: Medicare Other | Admitting: Ophthalmology

## 2019-04-13 DIAGNOSIS — I10 Essential (primary) hypertension: Secondary | ICD-10-CM

## 2019-04-13 DIAGNOSIS — H43813 Vitreous degeneration, bilateral: Secondary | ICD-10-CM

## 2019-04-13 DIAGNOSIS — D3131 Benign neoplasm of right choroid: Secondary | ICD-10-CM | POA: Diagnosis not present

## 2019-04-13 DIAGNOSIS — H35033 Hypertensive retinopathy, bilateral: Secondary | ICD-10-CM

## 2019-04-13 DIAGNOSIS — H353231 Exudative age-related macular degeneration, bilateral, with active choroidal neovascularization: Secondary | ICD-10-CM

## 2019-05-08 DIAGNOSIS — Z23 Encounter for immunization: Secondary | ICD-10-CM | POA: Diagnosis not present

## 2019-07-06 ENCOUNTER — Encounter (INDEPENDENT_AMBULATORY_CARE_PROVIDER_SITE_OTHER): Payer: Medicare Other | Admitting: Ophthalmology

## 2019-07-06 ENCOUNTER — Other Ambulatory Visit: Payer: Self-pay

## 2019-07-06 DIAGNOSIS — I10 Essential (primary) hypertension: Secondary | ICD-10-CM | POA: Diagnosis not present

## 2019-07-06 DIAGNOSIS — H43813 Vitreous degeneration, bilateral: Secondary | ICD-10-CM

## 2019-07-06 DIAGNOSIS — D3131 Benign neoplasm of right choroid: Secondary | ICD-10-CM

## 2019-07-06 DIAGNOSIS — H35033 Hypertensive retinopathy, bilateral: Secondary | ICD-10-CM | POA: Diagnosis not present

## 2019-07-06 DIAGNOSIS — H353231 Exudative age-related macular degeneration, bilateral, with active choroidal neovascularization: Secondary | ICD-10-CM

## 2019-07-13 ENCOUNTER — Encounter (INDEPENDENT_AMBULATORY_CARE_PROVIDER_SITE_OTHER): Payer: Medicare Other | Admitting: Ophthalmology

## 2019-07-13 ENCOUNTER — Other Ambulatory Visit: Payer: Self-pay

## 2019-07-13 DIAGNOSIS — H26492 Other secondary cataract, left eye: Secondary | ICD-10-CM

## 2019-08-07 ENCOUNTER — Encounter: Payer: Self-pay | Admitting: Family Medicine

## 2019-08-07 ENCOUNTER — Other Ambulatory Visit: Payer: Self-pay

## 2019-08-07 ENCOUNTER — Ambulatory Visit (INDEPENDENT_AMBULATORY_CARE_PROVIDER_SITE_OTHER): Payer: Medicare Other | Admitting: Family Medicine

## 2019-08-07 VITALS — Ht 60.0 in | Wt 152.0 lb

## 2019-08-07 DIAGNOSIS — I10 Essential (primary) hypertension: Secondary | ICD-10-CM | POA: Diagnosis not present

## 2019-08-07 DIAGNOSIS — N1831 Chronic kidney disease, stage 3a: Secondary | ICD-10-CM

## 2019-08-07 DIAGNOSIS — E782 Mixed hyperlipidemia: Secondary | ICD-10-CM

## 2019-08-07 DIAGNOSIS — I739 Peripheral vascular disease, unspecified: Secondary | ICD-10-CM

## 2019-08-07 NOTE — Progress Notes (Signed)
Name: Angela Duffy   MRN: [id number]    DOB: 1935-03-19   Date:08/07/2019       Progress Note  Subjective:    Chief Complaint  Chief Complaint  Patient presents with   Follow-up   Hypertension   Hyperlipidemia   Knee Pain    bilateral, onset 2 weeks ago worse at night    I connected with  Angela Duffy on 08/07/19 at 10:00 AM EST by telephone and verified that I am speaking with the correct person using two identifiers.  I discussed the limitations, risks, security and privacy concerns of performing an evaluation and management service by telephone and the availability of in person appointments. Staff also discussed with the patient that there may be a patient responsible charge related to this service. Patient Location: home Provider Location: [facility] clinic Additional Individuals present: none  HPI  Hypertension:  "Its been elevated since 1965 - it's always elevated" Currently managed on triamterene-hydrochlorothiazide  Pt reports  med compliance and denies any SE.  No lightheadedness, hypotension, syncope. [place] [address] drug when she got her flu shot her BP was last checked in Sept 2020 -  146/80 BP Readings from Last 3 Encounters:  02/05/19 (!) 160/72  04/24/18 (!) 142/80  04/20/18 (!) 160/72   Pt denies CP, SOB, exertional sx, LE edema, palpitation, Ha's, visual disturbances  Hyperlipidemia: Current Medication Regimen:  crestor 20 mg, takes daily, tolerates w/o SE or concerns Last Lipids: Lab Results  Component Value Date   CHOL 168 02/05/2019   HDL 53 02/05/2019   LDLCALC 94 02/05/2019   TRIG 112 02/05/2019   CHOLHDL 3.2 02/05/2019   - Current Diet:  healthy - Denies: Chest pain, shortness of breath, myalgias. - Documented aortic atherosclerosis? Yes - Risk factors for atherosclerosis: hypercholesterolemia, hypertension and peripheral vascular disease  Reviewed past imaging available on her chart which includes a CT angio of her chest from  2015 which showed diffuse arterial disease discussed with her what it stated about her thoracic aorta ascending and descending -explained and discussed with Angela Duffy today that she is managing that her atherosclerotic disease and controlling her blood pressure taking her Plavix and Crestor is all that she needs to do.  She should watch for any severe chest pain or tearing sensations any exertional symptoms would be concerning and she should get checked out immediately.  she's on plavix and stopped ASA per her other doctors recommendations  She asked about having blockages in her aorta and she is concerned more that came from she does have a history of peripheral vascular disease has had many procedures stents and endarterectomy to her carotid arteries.  She is managed by vascular specialist and is compliant with her Plavix.  She is not having any bleeding or bruising concerns she did stop aspirin due to injections and procedure she gets in her eyes where she was told to stop the aspirin because of those procedures and bleeding.  She is very physically active at her home and in her yard she states that she mows her lawn she is not having any claudication any chest pain shortness of breath, near syncope palpitations.  Her last labs did show slight decrease in her renal function from labs prior to that GFR was 45 June 2020 down from 43 with labs in August 2019.  Patient is willing to come into the clinic to get her lab work done.  She is complaining of bilateral knee Pain that is achy and gradually  worsening over the past couple months it is worse at night sometimes so severe that it is hard to walk.  She denies any associated swelling.  She did get a pillow to put under her legs and the position changes has helped a little bit.  She is using Tylenol 500 mg occasionally she asks if she is able to take this more regularly or at bedtime to prevent the severe pain from coming.  We did discuss dosing of Tylenol  she can take 500 mg 2000 mg up to 3 times a day.  Did review her liver function labs in the past which have been normal.  Patient does not want to come in to have her legs examined or to do any x-rays at this time she will try and treated with Tylenol and see how it does.   Patient Active Problem List   Diagnosis Date Noted   Macular degeneration 02/05/2019   Full code status 06/29/2017   Hypokalemia 09/29/2016   Medication monitoring encounter 09/25/2015   Bursitis of left shoulder 07/26/2015   Microalbuminuria 04/24/2015   IFG (impaired fasting glucose)    Hyperlipidemia    Hypertension    Obesity    PVD (peripheral vascular disease) (HCC)    History of tobacco use    CKD (chronic kidney disease) stage 3, GFR 30-59 ml/min    Left ventricular diastolic dysfunction    Left ventricular hypertrophy    Mitral valve regurgitation    Tricuspid regurgitation    Coronary atherosclerosis    Atherosclerosis    Renal artery atherosclerosis [facility])     Past Surgical History:  Procedure Laterality Date   CAROTID SURGERY  Feb 2004   CERVICAL DISCECTOMY  1970s   ILIAC ARTERY STENT  2003   KIDNEY STENT  April 2016   LAPAROSCOPY  1970s    Family History  Problem Relation Age of Onset   Cancer Mother        colon   Emphysema Father    Heart disease Father    Heart attack Father    Cancer Sister 45       breast   Lupus Sister    Heart disease Sister    Multiple sclerosis Brother    Hypertension Sister    Cancer Daughter        breast   Cancer Paternal Grandmother        female    Social History   Socioeconomic History   Marital status: Divorced    Spouse name: Not on file   Number of children: 3   Years of education: Not on file   Highest education level: 11th grade  Occupational History    Employer: RETIRED  Social Designer, fashion/clothing strain: Not hard at all   Food insecurity    Worry: Never true    Inability: Never  true   Transportation needs    Medical: No    Non-medical: No  Tobacco Use   Smoking status: Former Smoker    Packs/day: 2.00    Years: 64.00    Pack years: 128.00    Types: Cigarettes    Quit date: 04/14/2014    Years since quitting: 5.3   Smokeless tobacco: Never Used   Tobacco comment: smoking cessation materials not required  Substance and Sexual Activity   Alcohol use: Not Currently   Drug use: No   Sexual activity: Not Currently  Lifestyle   Physical activity    Days per week: 0  days    Minutes per session: 0 min   Stress: Not at all  Relationships   Social connections    Talks on phone: Patient refused    Gets together: Patient refused    Attends religious service: Patient refused    Active member of club or organization: Patient refused    Attends meetings of clubs or organizations: Patient refused    Relationship status: Patient refused   Intimate partner violence    Fear of current or ex partner: No    Emotionally abused: No    Physically abused: No    Forced sexual activity: No  Other Topics Concern   Not on file  Social History Narrative   Not on file     Current Outpatient Medications:    Bevacizumab (AVASTIN IV), Inject 1.25 mg into the eye every 6 (six) weeks. evry 12 weeks, Disp: , Rfl:    clopidogrel (PLAVIX) 75 MG tablet, Take 1 tablet (75 mg total) by mouth daily., Disp: 90 tablet, Rfl: 1   Magnesium 250 MG TABS, Take 1 tablet (250 mg total) by mouth every other day., Disp: , Rfl:    rosuvastatin (CRESTOR) 20 MG tablet, Take 1 tablet (20 mg total) by mouth daily., Disp: 90 tablet, Rfl: 1   triamterene-hydrochlorothiazide (MAXZIDE-25) 37.5-25 MG tablet, Take 1 tablet by mouth daily., Disp: 90 tablet, Rfl: 1  Allergies  Allergen Reactions   Cozaar [Losartan] Other (See Comments)    cramps   Felodipine Swelling   Lisinopril Other (See Comments)   Metoprolol Swelling   Sulfur Diarrhea and Nausea And Vomiting   Neomycin  Rash   Nickel Rash   Penicillins Rash    I personally reviewed active problem list, medication list, allergies, family history, social history, health maintenance, notes from last encounter, lab results, imaging with the patient/caregiver today.  Review of Systems  Constitutional: Negative.   HENT: Negative.   Eyes: Negative.   Respiratory: Negative.   Cardiovascular: Negative.   Gastrointestinal: Negative.   Endocrine: Negative.   Genitourinary: Negative.   Musculoskeletal: Negative.   Skin: Negative.   Allergic/Immunologic: Negative.   Neurological: Negative.   Hematological: Negative.   Psychiatric/Behavioral: Negative.   All other systems reviewed and are negative.     Objective:    Virtual encounter, vitals limited, only able to obtain the following: Today's Vitals   08/07/19 0920 08/07/19 0921  Weight: 152 lb (68.9 kg)   Height: 5' (1.524 m)   PainSc:  6    Body mass index is 29.69 kg/m. Nursing Note and Vital Signs reviewed.  Physical Exam Vitals signs and nursing note reviewed.  Neck:     Comments: Phonation clear Neurological:     Mental Status: She is alert.  Psychiatric:        Mood and Affect: Mood normal.        Behavior: Behavior normal.       PE limited by telephone encounter  No results found for this or any previous visit (from the past 72 hour(s)).  PHQ2/9: Depression screen [facility] 2/9 08/07/2019 02/05/2019 04/24/2018 04/20/2018 10/31/2017  Decreased Interest 0 0 0 0 0  Down, Depressed, Hopeless 0 0 0 0 0  PHQ - 2 Score 0 0 0 0 0  Altered sleeping 0 0 - 0 -  Tired, decreased energy 0 0 - 0 -  Change in appetite 0 0 - 0 -  Feeling bad or failure about yourself  0 0 - 0 -  Trouble concentrating  0 0 - 0 -  Moving slowly or fidgety/restless 0 0 - 0 -  Suicidal thoughts 0 0 - 0 -  PHQ-9 Score 0 0 - 0 -  Difficult doing work/chores Not difficult at all Not difficult at all - Not difficult at all -   PHQ-2/9 Result is negative.    Fall  Risk: Fall Risk  08/07/2019 02/05/2019 04/24/2018 04/20/2018 10/31/2017  Falls in the past year? 0 0 No No No  Number falls in past yr: 0 - - - -  Injury with Fall? 0 - - - -  Risk for fall due to : - - - Impaired vision;History of fall(s) -  Risk for fall due to: Comment - - - wears eyeglasses -     Assessment and Plan:   1. Essential hypertension Compliant with meds, her blood pressures when checked other places are well controlled several of her readings available in the chart or with her recent visits show blood pressure elevated usually due to anxiety, procedures etc.  I have asked her to get blood pressure cuff and check at home or continue to check when she is able to to make sure that her blood pressure stays under 140/90  2. Stage 3a chronic kidney disease I discussed with the patient her recent labs how kidney function was slightly decreased from her baseline over the past couple years would like to recheck.  3. Mixed hyperlipidemia Compliant with statin, no myalgias or side effects or concerns, healthy diet and very active lifestyle, recheck labs  4. PVD (peripheral vascular disease) ([place]) Per vascular specialist, compliant with Plavix and Crestor, no signs and symptoms of worsening peripheral vascular disease    I discussed the assessment and treatment plan with the patient. The patient was provided an opportunity to ask questions and all were answered. The patient agreed with the plan and demonstrated an understanding of the instructions.   The patient was advised to call back or seek an in-person evaluation if the symptoms worsen or if the condition fails to improve as anticipated.  I provided 22 minutes of non-face-to-face time during this encounter. Extra time today was spent reviewing patient's imaging on her chart and answering several of her questions about diagnoses such as aortic atherosclerosis and a blockage in her aorta.  Delsa Grana, PA-C 08/07/19 10:21 AM

## 2019-08-07 NOTE — Patient Instructions (Signed)
Come in a get labs rechecked.  Try tylenol 500 - 1000 mg up to three times a day for severe knee pain.  Please schedule an appointment for in person exam and evaluation for knee pain if it is not getting better.

## 2019-09-28 ENCOUNTER — Encounter (INDEPENDENT_AMBULATORY_CARE_PROVIDER_SITE_OTHER): Payer: Medicare Other | Admitting: Ophthalmology

## 2019-09-28 DIAGNOSIS — H35033 Hypertensive retinopathy, bilateral: Secondary | ICD-10-CM

## 2019-09-28 DIAGNOSIS — I10 Essential (primary) hypertension: Secondary | ICD-10-CM | POA: Diagnosis not present

## 2019-09-28 DIAGNOSIS — D3131 Benign neoplasm of right choroid: Secondary | ICD-10-CM

## 2019-09-28 DIAGNOSIS — H353231 Exudative age-related macular degeneration, bilateral, with active choroidal neovascularization: Secondary | ICD-10-CM | POA: Diagnosis not present

## 2019-09-28 DIAGNOSIS — H43813 Vitreous degeneration, bilateral: Secondary | ICD-10-CM

## 2019-12-04 ENCOUNTER — Other Ambulatory Visit: Payer: Self-pay | Admitting: Family Medicine

## 2019-12-04 DIAGNOSIS — I701 Atherosclerosis of renal artery: Secondary | ICD-10-CM

## 2019-12-04 LAB — COMPLETE METABOLIC PANEL WITH GFR
AG Ratio: 1.7 (calc) (ref 1.0–2.5)
ALT: 14 U/L (ref 6–29)
AST: 19 U/L (ref 10–35)
Albumin: 4.3 g/dL (ref 3.6–5.1)
Alkaline phosphatase (APISO): 51 U/L (ref 37–153)
BUN/Creatinine Ratio: 20 (calc) (ref 6–22)
BUN: 18 mg/dL (ref 7–25)
CO2: 30 mmol/L (ref 20–32)
Calcium: 9.4 mg/dL (ref 8.6–10.4)
Chloride: 101 mmol/L (ref 98–110)
Creat: 0.91 mg/dL — ABNORMAL HIGH (ref 0.60–0.88)
GFR, Est African American: 68 mL/min/{1.73_m2} (ref >=60)
GFR, Est Non African American: 59 mL/min/{1.73_m2} — ABNORMAL LOW (ref >=60)
Globulin: 2.5 g/dL (calc) (ref 1.9–3.7)
Glucose, Bld: 108 mg/dL — ABNORMAL HIGH (ref 65–99)
Potassium: 3.5 mmol/L (ref 3.5–5.3)
Sodium: 141 mmol/L (ref 135–146)
Total Bilirubin: 0.6 mg/dL (ref 0.2–1.2)
Total Protein: 6.8 g/dL (ref 6.1–8.1)

## 2019-12-04 LAB — LIPID PANEL
Cholesterol: 162 mg/dL (ref <200)
HDL: 49 mg/dL — ABNORMAL LOW (ref >=50)
LDL Cholesterol (Calc): 89 mg/dL (calc)
Non-HDL Cholesterol (Calc): 113 mg/dL (calc) (ref <130)
Total CHOL/HDL Ratio: 3.3 (calc) (ref <5.0)
Triglycerides: 140 mg/dL (ref <150)

## 2019-12-04 NOTE — Telephone Encounter (Signed)
Pt needs a refill on her blood thinner to be sent to her pharmacy

## 2019-12-04 NOTE — Telephone Encounter (Signed)
Refill request for general medication.  Last office visit:08/06/20  No follow-ups on file.

## 2019-12-05 ENCOUNTER — Encounter: Payer: Self-pay | Admitting: Family Medicine

## 2019-12-05 MED ORDER — CLOPIDOGREL BISULFATE 75 MG PO TABS: 75.0000 mg | ORAL_TABLET | Freq: Every day | ORAL | 3 refills | Status: DC

## 2019-12-05 NOTE — Progress Notes (Signed)
Patient's labs completed and reviewed today, they are printed so that we can review them at her future appointment.  Blood sugar mildly elevated, creatinine at her baseline with stable renal function consistent with CKD stage IIIb.  Cholesterol well controlled and improved over the past several years.  We will try to add A1c onto her blood work with mildly elevated blood sugar and history of prediabetes.  Electrolytes, liver function normal  Delsa Grana, PA-C

## 2019-12-18 ENCOUNTER — Other Ambulatory Visit: Payer: Self-pay

## 2019-12-18 ENCOUNTER — Ambulatory Visit (INDEPENDENT_AMBULATORY_CARE_PROVIDER_SITE_OTHER): Payer: Medicare Other | Admitting: Family Medicine

## 2019-12-18 ENCOUNTER — Encounter: Payer: Self-pay | Admitting: Family Medicine

## 2019-12-18 VITALS — BP 164/78 | HR 84 | Temp 98.1°F | Resp 14 | Ht 60.0 in | Wt 156.3 lb

## 2019-12-18 DIAGNOSIS — R7301 Impaired fasting glucose: Secondary | ICD-10-CM | POA: Diagnosis not present

## 2019-12-18 DIAGNOSIS — I10 Essential (primary) hypertension: Secondary | ICD-10-CM | POA: Diagnosis not present

## 2019-12-18 DIAGNOSIS — I701 Atherosclerosis of renal artery: Secondary | ICD-10-CM | POA: Diagnosis not present

## 2019-12-18 DIAGNOSIS — E782 Mixed hyperlipidemia: Secondary | ICD-10-CM | POA: Diagnosis not present

## 2019-12-18 DIAGNOSIS — I739 Peripheral vascular disease, unspecified: Secondary | ICD-10-CM | POA: Diagnosis not present

## 2019-12-18 LAB — POCT GLYCOSYLATED HEMOGLOBIN (HGB A1C): Hemoglobin A1C: 5.9 % — AB (ref 4.0–5.6)

## 2019-12-18 MED ORDER — ROSUVASTATIN CALCIUM 20 MG PO TABS: 20.0000 mg | ORAL_TABLET | Freq: Every day | ORAL | 3 refills | Status: DC

## 2019-12-18 MED ORDER — TRIAMTERENE-HCTZ 37.5-25 MG PO TABS: 1.0000 | ORAL_TABLET | Freq: Every day | ORAL | 3 refills | Status: DC

## 2019-12-18 NOTE — Patient Instructions (Signed)
Med refills were sent into your pharmacy   Managing Your Hypertension Hypertension is commonly called high blood pressure. This is when the force of your blood pressing against the walls of your arteries is too strong. Arteries are blood vessels that carry blood from your heart throughout your body. Hypertension forces the heart to work harder to pump blood, and may cause the arteries to become narrow or stiff. Having untreated or uncontrolled hypertension can cause heart attack, stroke, kidney disease, and other problems. What are blood pressure readings? A blood pressure reading consists of a higher number over a lower number. Ideally, your blood pressure should be below 120/80. The first ("top") number is called the systolic pressure. It is a measure of the pressure in your arteries as your heart beats. The second ("bottom") number is called the diastolic pressure. It is a measure of the pressure in your arteries as the heart relaxes. What does my blood pressure reading mean? Blood pressure is classified into four stages. Based on your blood pressure reading, your health care provider may use the following stages to determine what type of treatment you need, if any. Systolic pressure and diastolic pressure are measured in a unit called mm Hg. Normal  Systolic pressure: below 123456.  Diastolic pressure: below 80. Elevated  Systolic pressure: [id number].  Diastolic pressure: below 80. Hypertension stage 1  Systolic pressure: 0000000.  Diastolic pressure: [id number]. Hypertension stage 2  Systolic pressure: [id number] or above.  Diastolic pressure: 90 or above. What health risks are associated with hypertension? Managing your hypertension is an important responsibility. Uncontrolled hypertension can lead to:  A heart attack.  A stroke.  A weakened blood vessel (aneurysm).  Heart failure.  Kidney damage.  Eye damage.  Metabolic syndrome.  Memory and concentration problems. What changes  can I make to manage my hypertension? Hypertension can be managed by making lifestyle changes and possibly by taking medicines. Your health care provider will help you make a plan to bring your blood pressure within a normal range. Eating and drinking   Eat a diet that is high in fiber and potassium, and low in salt (sodium), added sugar, and fat. An example eating plan is called the DASH (Dietary Approaches to Stop Hypertension) diet. To eat this way: ? Eat plenty of fresh fruits and vegetables. Try to fill half of your plate at each meal with fruits and vegetables. ? Eat whole grains, such as whole wheat pasta, brown rice, or whole grain bread. Fill about one quarter of your plate with whole grains. ? Eat low-fat diary products. ? Avoid fatty cuts of meat, processed or cured meats, and poultry with skin. Fill about one quarter of your plate with lean proteins such as fish, chicken without skin, beans, eggs, and tofu. ? Avoid premade and processed foods. These tend to be higher in sodium, added sugar, and fat.  Reduce your daily sodium intake. Most people with hypertension should eat less than 1,500 mg of sodium a day.  Limit alcohol intake to no more than 1 drink a day for nonpregnant women and 2 drinks a day for men. One drink equals 12 oz of beer, 5 oz of wine, or 1 oz of hard liquor. Lifestyle  Work with your health care provider to maintain a healthy body weight, or to lose weight. Ask what an ideal weight is for you.  Get at least 30 minutes of exercise that causes your heart to beat faster (aerobic exercise) most days of the week. Activities  may include walking, swimming, or biking.  Include exercise to strengthen your muscles (resistance exercise), such as weight lifting, as part of your weekly exercise routine. Try to do these types of exercises for 30 minutes at least 3 days a week.  Do not use any products that contain nicotine or tobacco, such as cigarettes and e-cigarettes. If  you need help quitting, ask your health care provider.  Control any long-term (chronic) conditions you have, such as high cholesterol or diabetes. Monitoring  Monitor your blood pressure at home as told by your health care provider. Your personal target blood pressure may vary depending on your medical conditions, your age, and other factors.  Have your blood pressure checked regularly, as often as told by your health care provider. Working with your health care provider  Review all the medicines you take with your health care provider because there may be side effects or interactions.  Talk with your health care provider about your diet, exercise habits, and other lifestyle factors that may be contributing to hypertension.  Visit your health care provider regularly. Your health care provider can help you create and adjust your plan for managing hypertension. Will I need medicine to control my blood pressure? Your health care provider may prescribe medicine if lifestyle changes are not enough to get your blood pressure under control, and if:  Your systolic blood pressure is 130 or higher.  Your diastolic blood pressure is 80 or higher. Take medicines only as told by your health care provider. Follow the directions carefully. Blood pressure medicines must be taken as prescribed. The medicine does not work as well when you skip doses. Skipping doses also puts you at risk for problems. Contact a health care provider if:  You think you are having a reaction to medicines you have taken.  You have repeated (recurrent) headaches.  You feel dizzy.  You have swelling in your ankles.  You have trouble with your vision. Get help right away if:  You develop a severe headache or confusion.  You have unusual weakness or numbness, or you feel faint.  You have severe pain in your chest or abdomen.  You vomit repeatedly.  You have trouble breathing. Summary  Hypertension is when the  force of blood pumping through your arteries is too strong. If this condition is not controlled, it may put you at risk for serious complications.  Your personal target blood pressure may vary depending on your medical conditions, your age, and other factors. For most people, a normal blood pressure is less than 120/80.  Hypertension is managed by lifestyle changes, medicines, or both. Lifestyle changes include weight loss, eating a healthy, low-sodium diet, exercising more, and limiting alcohol. This information is not intended to replace advice given to you by your health care provider. Make sure you discuss any questions you have with your health care provider. Document Revised: 12/15/2018 Document Reviewed: 07/21/2016 Elsevier Patient Education  [place].

## 2019-12-18 NOTE — Progress Notes (Signed)
Name: Angela Duffy   MRN: [id number]    DOB: 1934/12/23   Date:12/18/2019       Progress Note  Chief Complaint  Patient presents with  . Follow-up  . Medication Refill     Subjective:   Angela Duffy is a 85 y.o. female, presents to clinic for routine follow up on the conditions listed above.  Hypertension:  Pt reports she has well-controlled hypertension but it is always elevated at the doctor's office today is no exception Currently managed on triamterene-hydrochlorothiazide  Pt reports good med compliance and denies any SE.  No lightheadedness, hypotension, syncope. Blood pressure today is uncontrolled though she is asymptomatic and refuses to try or start other medications BP Readings from Last 3 Encounters:  12/18/19 (!) 164/78  02/05/19 (!) 160/72  04/24/18 (!) 142/80   Pt denies CP, SOB, exertional sx, LE edema, palpitation, Ha's, visual disturbances Dietary efforts for BP?  Is have a healthy diet and not add salt  Hyperlipidemia: Current Medication Regimen:    We will no change from visit 3 months ago she continues to take Crestor 20 mg every evening she tolerates without any side effects or concerns her last lipid panel was well controlled She request med refill today - Current Diet:  healthy - Denies: Chest pain, shortness of breath, myalgias. - Documented aortic atherosclerosis? Yes - Risk factors for atherosclerosis: hypercholesterolemia, hypertension and peripheral vascular disease  Interval history from 08/07/2019 office visit: Reviewed past imaging available on her chart which includes a CT angio of her chest from 2015 which showed diffuse arterial disease discussed with her what it stated about her thoracic aorta ascending and descending -explained and discussed with Everlene Farrier today that she is managing that her atherosclerotic disease and controlling her blood pressure taking her Plavix and Crestor is all that she needs to do.  She should watch for  any severe chest pain or tearing sensations any exertional symptoms would be concerning and she should get checked out immediately.    Labs recently done, unable to add A1C for prediabetes/DM well controlled with diet/lifestyle. Results for orders placed or performed in visit on 12/18/19  POCT HgB A1C  Result Value Ref Range   Hemoglobin A1C 5.9 (A) 4.0 - 5.6 %   HbA1c POC (<> result, manual entry)     HbA1c, POC (prediabetic range)     HbA1c, POC (controlled diabetic range)     A1c continues to be in prediabetes range and is well controlled with lifestyle  BP elevated - repeated here today - hx of same -"always high"  Recently seen and labs done - here for refills, no changes to diet/weight/meds, no complaints of new SE or concerns  Blood pressure was repeated at the end of her visit today was slightly higher than initial reading  Patient Active Problem List   Diagnosis Date Noted  . Macular degeneration 02/05/2019  . Full code status 06/29/2017  . Hypokalemia 09/29/2016  . Medication monitoring encounter 09/25/2015  . Bursitis of left shoulder 07/26/2015  . Microalbuminuria 04/24/2015  . IFG (impaired fasting glucose)   . Mixed hyperlipidemia   . Essential hypertension   . Obesity   . PVD (peripheral vascular disease) ([employer])   . History of tobacco use   . Stage 3a chronic kidney disease   . Left ventricular diastolic dysfunction   . Left ventricular hypertrophy   . Mitral valve regurgitation   . Tricuspid regurgitation   . Coronary atherosclerosis   .  Atherosclerosis   . Renal artery atherosclerosis [facility])     Past Surgical History:  Procedure Laterality Date  . CAROTID SURGERY  Feb 2004  . CERVICAL DISCECTOMY  1970s  . ILIAC ARTERY STENT  2003  . KIDNEY STENT  April 2016  . LAPAROSCOPY  1970s    Family History  Problem Relation Age of Onset  . Cancer Mother        colon  . Emphysema Father   . Heart disease Father   . Heart attack Father   . Cancer Sister  16       breast  . Lupus Sister   . Heart disease Sister   . Multiple sclerosis Brother   . Hypertension Sister   . Cancer Daughter        breast  . Cancer Paternal Grandmother        female    Social History   Tobacco Use  . Smoking status: Former Smoker    Packs/day: 2.00    Years: 64.00    Pack years: 128.00    Types: Cigarettes    Quit date: 04/14/2014    Years since quitting: 5.6  . Smokeless tobacco: Never Used  . Tobacco comment: smoking cessation materials not required  Substance Use Topics  . Alcohol use: Not Currently  . Drug use: No      Current Outpatient Medications:  .  Bevacizumab (AVASTIN IV), Inject 1.25 mg into the eye every 6 (six) weeks. evry 12 weeks, Disp: , Rfl:  .  clopidogrel (PLAVIX) 75 MG tablet, Take 1 tablet (75 mg total) by mouth daily., Disp: 90 tablet, Rfl: 3 .  Multiple Vitamins-Minerals (PRESERVISION AREDS 2 PO), Take by mouth., Disp: , Rfl:  .  rosuvastatin (CRESTOR) 20 MG tablet, Take 1 tablet (20 mg total) by mouth daily., Disp: 90 tablet, Rfl: 1 .  triamterene-hydrochlorothiazide (MAXZIDE-25) 37.5-25 MG tablet, Take 1 tablet by mouth daily., Disp: 90 tablet, Rfl: 1 .  vitamin B-12 (CYANOCOBALAMIN) 500 MCG tablet, Take 500 mcg by mouth daily., Disp: , Rfl:  .  Magnesium 250 MG TABS, Take 1 tablet (250 mg total) by mouth every other day., Disp: , Rfl:   Allergies  Allergen Reactions  . Cozaar [Losartan] Other (See Comments)    cramps  . Felodipine Swelling  . Lisinopril Other (See Comments)  . Metoprolol Swelling  . Sulfur Diarrhea and Nausea And Vomiting  . Neomycin Rash  . Nickel Rash  . Penicillins Rash    Chart Review Today: I personally reviewed active problem list, medication list, allergies, family history, social history, health maintenance, notes from last encounter, lab results, imaging with the patient/caregiver today.  Review of Systems  10 Systems reviewed and are negative for acute change except as noted in the  HPI.   Objective:    Vitals:   12/18/19 0928  BP: (!) 164/78  Pulse: 84  Resp: 14  Temp: 98.1 F (36.7 C)  SpO2: 95%  Weight: 156 lb 4.8 oz (70.9 kg)  Height: 5' (1.524 m)    Body mass index is 30.53 kg/m.  Physical Exam Vitals and nursing note reviewed.  Constitutional:      General: She is not in acute distress.    Appearance: Normal appearance. She is not ill-appearing, toxic-appearing or diaphoretic.     Comments: Well-appearing elderly female  HENT:     Right Ear: External ear normal.     Left Ear: External ear normal.  Eyes:     General:  Right eye: No discharge.        Left eye: No discharge.     Conjunctiva/sclera: Conjunctivae normal.  Neck:     Vascular: No carotid bruit.  Cardiovascular:     Rate and Rhythm: Normal rate and regular rhythm.     Pulses: Normal pulses.     Heart sounds: Normal heart sounds. No murmur. No friction rub. No gallop.   Pulmonary:     Effort: Pulmonary effort is normal.     Breath sounds: Normal breath sounds.  Abdominal:     General: Bowel sounds are normal. There is no distension.     Tenderness: There is no abdominal tenderness.  Musculoskeletal:     Cervical back: Normal range of motion and neck supple.  Skin:    Coloration: Skin is not jaundiced or pale.  Neurological:     Mental Status: She is alert. Mental status is at baseline.     Motor: No weakness.     Coordination: Coordination normal.  Psychiatric:        Mood and Affect: Mood normal.        Behavior: Behavior normal.        PHQ2/9: Depression screen [facility] 2/9 12/18/2019 08/07/2019 02/05/2019 04/24/2018 04/20/2018  Decreased Interest 0 0 0 0 0  Down, Depressed, Hopeless 0 0 0 0 0  PHQ - 2 Score 0 0 0 0 0  Altered sleeping 0 0 0 - 0  Tired, decreased energy 0 0 0 - 0  Change in appetite 0 0 0 - 0  Feeling bad or failure about yourself  0 0 0 - 0  Trouble concentrating 0 0 0 - 0  Moving slowly or fidgety/restless 0 0 0 - 0  Suicidal thoughts 0 0 0 -  0  PHQ-9 Score 0 0 0 - 0  Difficult doing work/chores Not difficult at all Not difficult at all Not difficult at all - Not difficult at all    phq 9 is neg, reviewed  Fall Risk: Fall Risk  12/18/2019 08/07/2019 02/05/2019 04/24/2018 04/20/2018  Falls in the past year? 0 0 0 No No  Number falls in past yr: 0 0 - - -  Injury with Fall? 0 0 - - -  Risk for fall due to : - - - - Impaired vision;History of fall(s)  Risk for fall due to: Comment - - - - wears eyeglasses    Functional Status Survey: Is the patient deaf or have difficulty hearing?: No Does the patient have difficulty seeing, even when wearing glasses/contacts?: Yes Does the patient have difficulty concentrating, remembering, or making decisions?: No Does the patient have difficulty walking or climbing stairs?: No Does the patient have difficulty dressing or bathing?: No Does the patient have difficulty doing errands alone such as visiting a doctor's office or shopping?: No   Assessment & Plan:      ICD-10-CM   1. Essential hypertension  I10 triamterene-hydrochlorothiazide (MAXZIDE-25) 37.5-25 MG tablet   Uncontrolled hypertension patient is asymptomatic states that it is always elevated in doctor's office instructed to monitor at home and return readings  2. PVD (peripheral vascular disease) ([facility])  I73.9 rosuvastatin (CRESTOR) 20 MG tablet   Per vascular specialist patient compliant with medications no new or worsening symptoms no claudication  3. Renal artery atherosclerosis ([facility])  I70.1 rosuvastatin (CRESTOR) 20 MG tablet   On statin and Plavix, blood pressure increased patient will follow up with vascular specialist for reassessment  4. Mixed hyperlipidemia  E78.2  rosuvastatin (CRESTOR) 20 MG tablet   Compliant with statin medication, no concerns or side effects labs recently done and reviewed today  5. IFG (impaired fasting glucose)  R73.01 POCT HgB A1C   Consistent with prediabetes A1c today was 5.7 continue diet and  lifestyle efforts      No follow-ups on file.   Delsa Grana, PA-C 12/18/19 9:41 AM

## 2019-12-25 ENCOUNTER — Encounter: Payer: Self-pay | Admitting: Family Medicine

## 2019-12-28 ENCOUNTER — Encounter (INDEPENDENT_AMBULATORY_CARE_PROVIDER_SITE_OTHER): Payer: Medicare Other | Admitting: Ophthalmology

## 2019-12-28 DIAGNOSIS — H43813 Vitreous degeneration, bilateral: Secondary | ICD-10-CM

## 2019-12-28 DIAGNOSIS — H353231 Exudative age-related macular degeneration, bilateral, with active choroidal neovascularization: Secondary | ICD-10-CM | POA: Diagnosis not present

## 2019-12-28 DIAGNOSIS — I10 Essential (primary) hypertension: Secondary | ICD-10-CM | POA: Diagnosis not present

## 2019-12-28 DIAGNOSIS — D3131 Benign neoplasm of right choroid: Secondary | ICD-10-CM | POA: Diagnosis not present

## 2019-12-28 DIAGNOSIS — H35033 Hypertensive retinopathy, bilateral: Secondary | ICD-10-CM | POA: Diagnosis not present

## 2020-01-01 ENCOUNTER — Encounter (INDEPENDENT_AMBULATORY_CARE_PROVIDER_SITE_OTHER): Payer: Medicare Other | Admitting: Ophthalmology

## 2020-01-01 DIAGNOSIS — H26491 Other secondary cataract, right eye: Secondary | ICD-10-CM

## 2020-01-22 ENCOUNTER — Telehealth: Payer: Self-pay | Admitting: Family Medicine

## 2020-01-22 NOTE — Chronic Care Management (AMB) (Signed)
  Chronic Care Management   Note  01/22/2020 Name: KATANA BERTHOLD MRN: [id number] DOB: [birth year]  Jenel Lucks Lacap is a 85 y.o. year old female who is a primary care patient of Delsa Grana, [place]. I reached out to [name] by phone today in response to a referral sent by Ms. Jenel Lucks Stejskal's health plan.     Ms. Aispuro was given information about Chronic Care Management services today including:  1. CCM service includes personalized support from designated clinical staff supervised by her physician, including individualized plan of care and coordination with other care providers 2. 24/7 contact phone numbers for assistance for urgent and routine care needs. 3. Service will only be billed when office clinical staff spend 20 minutes or more in a month to coordinate care. 4. Only one practitioner may furnish and bill the service in a calendar month. 5. The patient may stop CCM services at any time (effective at the end of the month) by phone call to the office staff. 6. The patient will be responsible for cost sharing (co-pay) of up to 20% of the service fee (after annual deductible is met).  Patient did not agree to enrollment in care management services and does not wish to consider at this time.  Follow up plan: The patient has been provided with contact information for the care management team and has been advised to call with any health related questions or concerns.   Noreene Larsson, [address], [place], [place] [address] Direct Dial: [phone] [email or web address]: [email or web address]

## 2020-03-28 ENCOUNTER — Other Ambulatory Visit: Payer: Self-pay

## 2020-03-28 ENCOUNTER — Encounter (INDEPENDENT_AMBULATORY_CARE_PROVIDER_SITE_OTHER): Payer: Medicare Other | Admitting: Ophthalmology

## 2020-03-28 DIAGNOSIS — H353231 Exudative age-related macular degeneration, bilateral, with active choroidal neovascularization: Secondary | ICD-10-CM | POA: Diagnosis not present

## 2020-03-28 DIAGNOSIS — D3131 Benign neoplasm of right choroid: Secondary | ICD-10-CM | POA: Diagnosis not present

## 2020-03-28 DIAGNOSIS — I10 Essential (primary) hypertension: Secondary | ICD-10-CM | POA: Diagnosis not present

## 2020-03-28 DIAGNOSIS — H35033 Hypertensive retinopathy, bilateral: Secondary | ICD-10-CM | POA: Diagnosis not present

## 2020-03-28 DIAGNOSIS — H43813 Vitreous degeneration, bilateral: Secondary | ICD-10-CM

## 2020-04-07 ENCOUNTER — Other Ambulatory Visit: Payer: Self-pay

## 2020-04-07 ENCOUNTER — Ambulatory Visit (INDEPENDENT_AMBULATORY_CARE_PROVIDER_SITE_OTHER): Payer: Medicare Other | Admitting: Family Medicine

## 2020-04-07 ENCOUNTER — Encounter: Payer: Self-pay | Admitting: Family Medicine

## 2020-04-07 VITALS — BP 148/78 | HR 88 | Temp 97.6°F | Resp 16 | Ht 60.0 in | Wt 155.1 lb

## 2020-04-07 DIAGNOSIS — I10 Essential (primary) hypertension: Secondary | ICD-10-CM

## 2020-04-07 DIAGNOSIS — G8929 Other chronic pain: Secondary | ICD-10-CM

## 2020-04-07 DIAGNOSIS — M25562 Pain in left knee: Secondary | ICD-10-CM

## 2020-04-07 DIAGNOSIS — I739 Peripheral vascular disease, unspecified: Secondary | ICD-10-CM

## 2020-04-07 DIAGNOSIS — R7303 Prediabetes: Secondary | ICD-10-CM

## 2020-04-07 DIAGNOSIS — M25561 Pain in right knee: Secondary | ICD-10-CM

## 2020-04-07 DIAGNOSIS — I701 Atherosclerosis of renal artery: Secondary | ICD-10-CM

## 2020-04-07 DIAGNOSIS — N1831 Chronic kidney disease, stage 3a: Secondary | ICD-10-CM

## 2020-04-07 DIAGNOSIS — E782 Mixed hyperlipidemia: Secondary | ICD-10-CM

## 2020-04-07 NOTE — Progress Notes (Addendum)
Name: Angela Duffy   MRN: [id number]    DOB: 02-Jan-1935   Date:04/07/2020       Progress Note  Chief Complaint  Patient presents with  . Follow-up  . Hypertension  . Hyperlipidemia     Subjective:   Angela Duffy is a 85 y.o. female, presents to clinic for   Hypertension:  Currently managed on maxzide Pt reports good med compliance and denies any SE.  No lightheadedness, hypotension, syncope. Blood pressure today is fairly well controlled for age - she is anxious BP Readings from Last 3 Encounters:  04/07/20 (!) 148/78  12/18/19 (!) 164/78  02/05/19 (!) 160/72   Pt denies CP, SOB, exertional sx, LE edema, palpitation, Ha's, visual disturbances Dietary efforts for BP?  Healthy diet and exercise, low salt   Hyperlipidemia: Currently treated with crestor 20 mg, pt reports good med compliance Last Lipids: Lab Results  Component Value Date   CHOL 162 12/04/2019   HDL 49 (L) 12/04/2019   LDLCALC 89 12/04/2019   TRIG 140 12/04/2019   CHOLHDL 3.3 12/04/2019   - Denies: Chest pain, shortness of breath, myalgias, claudication  Hx of PAD with stents - sees vascular - on plavix, no new or worsening sx  A1C last checked April 5.9  She reports intermittent and gradually worsening bilateral knee pain Grinding feeling, will be severe sometimes for a week or two, she is less active now due to pain and inactivity due to covid, she has not had any swelling.  She is taking stairs one at a time and being more careful.  She is starting to walk with her daughter.  She will consider ortho referral if pain worsens at all    Current Outpatient Medications:  .  Bevacizumab (AVASTIN IV), Inject 1.25 mg into the eye every 6 (six) weeks. evry 12 weeks, Disp: , Rfl:  .  clopidogrel (PLAVIX) 75 MG tablet, Take 1 tablet (75 mg total) by mouth daily., Disp: 90 tablet, Rfl: 3 .  Multiple Vitamins-Minerals (PRESERVISION AREDS 2 PO), Take by mouth., Disp: , Rfl:  .  rosuvastatin  (CRESTOR) 20 MG tablet, Take 1 tablet (20 mg total) by mouth daily., Disp: 90 tablet, Rfl: 3 .  triamterene-hydrochlorothiazide (MAXZIDE-25) 37.5-25 MG tablet, Take 1 tablet by mouth daily., Disp: 90 tablet, Rfl: 3 .  vitamin B-12 (CYANOCOBALAMIN) 500 MCG tablet, Take 500 mcg by mouth daily., Disp: , Rfl:  .  Magnesium 250 MG TABS, Take 1 tablet (250 mg total) by mouth every other day. (Patient not taking: Reported on 04/07/2020), Disp: , Rfl:   Patient Active Problem List   Diagnosis Date Noted  . Macular degeneration 02/05/2019  . Full code status 06/29/2017  . Hypokalemia 09/29/2016  . Medication monitoring encounter 09/25/2015  . Bursitis of left shoulder 07/26/2015  . Microalbuminuria 04/24/2015  . IFG (impaired fasting glucose)   . Mixed hyperlipidemia   . Essential hypertension   . Obesity   . PVD (peripheral vascular disease) ([place])   . History of tobacco use   . Stage 3a chronic kidney disease   . Left ventricular diastolic dysfunction   . Left ventricular hypertrophy   . Mitral valve regurgitation   . Tricuspid regurgitation   . Coronary atherosclerosis   . Atherosclerosis   . Renal artery atherosclerosis [facility])     Past Surgical History:  Procedure Laterality Date  . CAROTID SURGERY  Feb 2004  . CERVICAL DISCECTOMY  1970s  . ILIAC ARTERY STENT  2003  .  KIDNEY STENT  April 2016  . LAPAROSCOPY  1970s    Family History  Problem Relation Age of Onset  . Cancer Mother        colon  . Emphysema Father   . Heart disease Father   . Heart attack Father   . Cancer Sister 53       breast  . Lupus Sister   . Heart disease Sister   . Multiple sclerosis Brother   . Hypertension Sister   . Cancer Daughter        breast  . Cancer Paternal Grandmother        female    Social History   Tobacco Use  . Smoking status: Former Smoker    Packs/day: 2.00    Years: 64.00    Pack years: 128.00    Types: Cigarettes    Quit date: 04/14/2014    Years since quitting: 5.9  .  Smokeless tobacco: Never Used  . Tobacco comment: smoking cessation materials not required  Vaping Use  . Vaping Use: Never used  Substance Use Topics  . Alcohol use: Not Currently  . Drug use: No     Allergies  Allergen Reactions  . Cozaar [Losartan] Other (See Comments)    cramps  . Felodipine Swelling  . Lisinopril Other (See Comments)  . Metoprolol Swelling  . Sulfur Diarrhea and Nausea And Vomiting  . Neomycin Rash  . Nickel Rash  . Penicillins Rash    Health Maintenance  Topic Date Due  . INFLUENZA VACCINE  04/06/2020  . DEXA SCAN  12/17/2020 (Originally [birth year])  . TETANUS/TDAP  04/24/2028  . COVID-19 Vaccine  Completed  . PNA vac Low Risk Adult  Completed  . MAMMOGRAM  Discontinued    Chart Review Today: I personally reviewed active problem list, medication list, allergies, family history, social history, health maintenance, notes from last encounter, lab results, imaging with the patient/caregiver today.   Review of Systems  10 Systems reviewed and are negative for acute change except as noted in the HPI.  Objective:   Vitals:   04/07/20 1008  BP: (!) 148/78  Pulse: 88  Resp: 16  Temp: 97.6 F (36.4 C)  TempSrc: Temporal  SpO2: 95%  Weight: 155 lb 1.6 oz (70.4 kg)  Height: 5' (1.524 m)    Body mass index is 30.29 kg/m.  Physical Exam Vitals and nursing note reviewed.  Constitutional:      General: She is not in acute distress.    Appearance: Normal appearance. She is not ill-appearing, toxic-appearing or diaphoretic.     Comments: Well-appearing elderly female  HENT:     Right Ear: External ear normal.     Left Ear: External ear normal.  Eyes:     General:        Right eye: No discharge.        Left eye: No discharge.     Conjunctiva/sclera: Conjunctivae normal.  Neck:     Vascular: No carotid bruit.  Cardiovascular:     Rate and Rhythm: Normal rate and regular rhythm.     Pulses: Normal pulses.     Heart sounds: Normal heart  sounds. No murmur heard.  No friction rub. No gallop.   Pulmonary:     Effort: Pulmonary effort is normal.     Breath sounds: Normal breath sounds.  Abdominal:     General: Bowel sounds are normal. There is no distension.     Tenderness: There is no abdominal tenderness.  Musculoskeletal:     Cervical back: Normal range of motion and neck supple.     Right knee: No swelling, deformity, effusion, erythema or bony tenderness. Normal range of motion. No tenderness.     Left knee: No swelling, deformity, effusion, erythema or bony tenderness. Normal range of motion. No tenderness.     Comments: Mildly antalgic gait  Skin:    Coloration: Skin is not jaundiced or pale.  Neurological:     Mental Status: She is alert. Mental status is at baseline.     Motor: No weakness.     Coordination: Coordination normal.  Psychiatric:        Mood and Affect: Mood normal.        Behavior: Behavior normal.         Assessment & Plan:     ICD-10-CM   1. Essential hypertension  I10    labs done recently, BP improved from most recent visits, for age well controlled, pt tolerating meds and has no SE, sx or concerns  2. Mixed hyperlipidemia  E78.2    on statin, last lipid at goal, tolerating meds, does not want to d/c statin currently  3. Prediabetes  R73.03    has been well controlled, she is working on [employer] and getting back to walking more - recheck A1C at next visit, no sx concerning for new onset DM  4. Chronic pain of both knees  M25.561    M25.562    G89.29    intermittent and gradually worsening x 1.5 years, no swelling or sig finding today on exam, likely OA, discussed referral to ortho and tx options  5. PVD (peripheral vascular disease) (HCC)  I73.9    compliant with statin and plavix - sees vascular, no new sx or concerns today  6. Renal artery atherosclerosis (HCC)  I70.1    same as above  7. Stage 3a chronic kidney disease  N18.31    renal function has been stable, last labs  reviewed     Return in about 7 months (around 11/04/2020) for routine follow up (HTN, HLD, prediabetes).   Delsa Grana, PA-C 04/07/20 10:15 AM

## 2020-07-11 ENCOUNTER — Other Ambulatory Visit: Payer: Self-pay

## 2020-07-11 ENCOUNTER — Encounter (INDEPENDENT_AMBULATORY_CARE_PROVIDER_SITE_OTHER): Payer: Medicare Other | Admitting: Ophthalmology

## 2020-07-11 DIAGNOSIS — H35033 Hypertensive retinopathy, bilateral: Secondary | ICD-10-CM | POA: Diagnosis not present

## 2020-07-11 DIAGNOSIS — D3131 Benign neoplasm of right choroid: Secondary | ICD-10-CM

## 2020-07-11 DIAGNOSIS — I10 Essential (primary) hypertension: Secondary | ICD-10-CM

## 2020-07-11 DIAGNOSIS — H353231 Exudative age-related macular degeneration, bilateral, with active choroidal neovascularization: Secondary | ICD-10-CM | POA: Diagnosis not present

## 2020-07-11 DIAGNOSIS — H43813 Vitreous degeneration, bilateral: Secondary | ICD-10-CM

## 2020-10-24 ENCOUNTER — Encounter (INDEPENDENT_AMBULATORY_CARE_PROVIDER_SITE_OTHER): Payer: Medicare Other | Admitting: Ophthalmology

## 2020-10-24 ENCOUNTER — Other Ambulatory Visit: Payer: Self-pay

## 2020-10-24 DIAGNOSIS — I10 Essential (primary) hypertension: Secondary | ICD-10-CM | POA: Diagnosis not present

## 2020-10-24 DIAGNOSIS — D3131 Benign neoplasm of right choroid: Secondary | ICD-10-CM

## 2020-10-24 DIAGNOSIS — H35033 Hypertensive retinopathy, bilateral: Secondary | ICD-10-CM

## 2020-10-24 DIAGNOSIS — H353231 Exudative age-related macular degeneration, bilateral, with active choroidal neovascularization: Secondary | ICD-10-CM

## 2020-10-24 DIAGNOSIS — H43813 Vitreous degeneration, bilateral: Secondary | ICD-10-CM

## 2020-11-05 ENCOUNTER — Ambulatory Visit: Payer: Medicare Other | Admitting: Family Medicine

## 2020-11-10 ENCOUNTER — Encounter: Payer: Self-pay | Admitting: Family Medicine

## 2020-11-10 ENCOUNTER — Ambulatory Visit (INDEPENDENT_AMBULATORY_CARE_PROVIDER_SITE_OTHER): Payer: Medicare Other | Admitting: Family Medicine

## 2020-11-10 ENCOUNTER — Other Ambulatory Visit: Payer: Self-pay

## 2020-11-10 VITALS — BP 120/60 | HR 88 | Temp 98.2°F | Resp 16 | Ht 60.0 in | Wt 151.4 lb

## 2020-11-10 DIAGNOSIS — R7301 Impaired fasting glucose: Secondary | ICD-10-CM

## 2020-11-10 DIAGNOSIS — I701 Atherosclerosis of renal artery: Secondary | ICD-10-CM

## 2020-11-10 DIAGNOSIS — I10 Essential (primary) hypertension: Secondary | ICD-10-CM

## 2020-11-10 DIAGNOSIS — I739 Peripheral vascular disease, unspecified: Secondary | ICD-10-CM | POA: Diagnosis not present

## 2020-11-10 DIAGNOSIS — M25569 Pain in unspecified knee: Secondary | ICD-10-CM | POA: Insufficient documentation

## 2020-11-10 DIAGNOSIS — E538 Deficiency of other specified B group vitamins: Secondary | ICD-10-CM

## 2020-11-10 DIAGNOSIS — M25561 Pain in right knee: Secondary | ICD-10-CM | POA: Diagnosis not present

## 2020-11-10 DIAGNOSIS — N1831 Chronic kidney disease, stage 3a: Secondary | ICD-10-CM | POA: Diagnosis not present

## 2020-11-10 DIAGNOSIS — E782 Mixed hyperlipidemia: Secondary | ICD-10-CM

## 2020-11-10 DIAGNOSIS — R7303 Prediabetes: Secondary | ICD-10-CM

## 2020-11-10 DIAGNOSIS — G8929 Other chronic pain: Secondary | ICD-10-CM

## 2020-11-10 LAB — POCT GLYCOSYLATED HEMOGLOBIN (HGB A1C): Hemoglobin A1C: 5.7 % — AB (ref 4.0–5.6)

## 2020-11-10 MED ORDER — CLOPIDOGREL BISULFATE 75 MG PO TABS: 75.0000 mg | ORAL_TABLET | Freq: Every day | ORAL | 3 refills | Status: DC

## 2020-11-10 MED ORDER — ROSUVASTATIN CALCIUM 20 MG PO TABS: 20.0000 mg | ORAL_TABLET | Freq: Every day | ORAL | 3 refills | Status: DC

## 2020-11-10 MED ORDER — TRIAMTERENE-HCTZ 37.5-25 MG PO TABS: 1.0000 | ORAL_TABLET | Freq: Every day | ORAL | 3 refills | Status: DC

## 2020-11-10 NOTE — Assessment & Plan Note (Signed)
Doing well on current regimen, no changes made today. Work to control lipids, HTN.

## 2020-11-10 NOTE — Assessment & Plan Note (Signed)
Diet controlled. A1c well controlled today.

## 2020-11-10 NOTE — Patient Instructions (Addendum)
It was great to see you!  Our plans for today:  - We are getting an xray of your knee, we will release these results to your MyChart and let you know of next steps.  - Continue to use tylenol and lidocaine as needed for pain. You can also use ibuprofen or aleve as needed for pain.  - We sent refills of your medications to the pharmacy.  We are checking some labs today, we will release these results to your MyChart.  Take care and seek immediate care sooner if you develop any concerns.   Dr. Ky Barban

## 2020-11-10 NOTE — Assessment & Plan Note (Signed)
Likely arthritic given no h/o trauma, bursal fluid accumulation and spurring noted on bedside US however given no prior formal imaging, will evaluate further with XR. Continue with OTC pain relief. May benefit from fluid aspiration/steroid injection pending XR results.

## 2020-11-10 NOTE — Assessment & Plan Note (Addendum)
Doing well on current regimen, no changes made today. Refills provided, obtaining labs.

## 2020-11-10 NOTE — Assessment & Plan Note (Signed)
Recheck labs today. 

## 2020-11-10 NOTE — Assessment & Plan Note (Signed)
Refills provided, recheck labs today.

## 2020-11-10 NOTE — Progress Notes (Signed)
   SUBJECTIVE:   CHIEF COMPLAINT / HPI:   Hypertension/PVD/CAD/HLD: - Medications: plavix 75mg , rosuvastatin 20mg , maxzide 37.5-25mg  daily - Compliance: good - Checking BP at home: no - Denies any SOB, CP, medication SEs, or symptoms of hypotension  CHRONIC KIDNEY DISEASE CKD status: controlled Medications renally dose: yes Pneumovax:  Up to Date Influenza Vaccine:  Not up to Date  Prediabetes - Last A1c 5.9 - Medications: none - Compliance: n/a - Statin: yes  KNEE PAIN - taking tylenol extra strength, easing off.  - never had imaging. Duration: 2 months Involved knee: bilateral, R>L Mechanism of injury: unknown Location:anterior, medial Quality:  Ill defined Frequency: intermittent Aggravating factors: bending knee, walking on unlevel ground   Alleviating factors: tylenol, lidocaine roll-on  Status: fluctuating Treatments attempted: tylenol, lidocaine, heat, compression brace  Relief with NSAIDs?:  No NSAIDs Taken Weakness with weight bearing or walking: no Sensation of giving way: yes Locking: no Popping: grinding Bruising: no Swelling: yes Redness: no Paresthesias/decreased sensation: no Fevers: no   OBJECTIVE:   BP 120/60   Pulse 88   Temp 98.2 F (36.8 C) (Oral)   Resp 16   Ht 5' (1.524 m)   Wt 151 lb 6.4 oz (68.7 kg)   SpO2 96%   BMI 29.57 kg/m   Gen: well appearing, in NAD Card: RRR, systolic murmur Lungs: CTAB Ext: WWP, no edema MSK: slight swelling noted to R knee. No redness, warmth or overlying skin changes. Slight tenderness to palpation of lateral joint line. Full AROM without pain, able to bear weight. Negative thessaly. No joint laxity. Negative ant/post drawer, lachmann's.  Bedside US of R knee - moderate supra and infrapatellar bursal fluid accumulation. Spurring noted of bilateral joint lines with degenerative mensici. Tendons appear intact.   ASSESSMENT/PLAN:   Knee pain Likely arthritic given no h/o trauma, bursal fluid  accumulation and spurring noted on bedside US however given no prior formal imaging, will evaluate further with XR. Continue with OTC pain relief. May benefit from fluid aspiration/steroid injection pending XR results.  Mixed hyperlipidemia Refills provided, recheck labs today.  Stage 3a chronic kidney disease Recheck labs today.  PVD (peripheral vascular disease) ([place]) Doing well on current regimen, no changes made today. Work to control lipids, HTN.  Benign essential hypertension Doing well on current regimen, no changes made today. Refills provided, obtaining labs.  IFG (impaired fasting glucose) Diet controlled. A1c well controlled today.    F/u in 6 months.  Myles Gip, DO

## 2020-11-11 LAB — LIPID PANEL
Cholesterol: 165 mg/dL (ref <200)
HDL: 45 mg/dL — ABNORMAL LOW (ref >=50)
LDL Cholesterol (Calc): 92 mg/dL (calc)
Non-HDL Cholesterol (Calc): 120 mg/dL (calc) (ref <130)
Total CHOL/HDL Ratio: 3.7 (calc) (ref <5.0)
Triglycerides: 186 mg/dL — ABNORMAL HIGH (ref <150)

## 2020-11-11 LAB — BASIC METABOLIC PANEL
BUN: 19 mg/dL (ref 7–25)
CO2: 30 mmol/L (ref 20–32)
Calcium: 9.7 mg/dL (ref 8.6–10.4)
Chloride: 101 mmol/L (ref 98–110)
Creat: 0.82 mg/dL (ref 0.60–0.88)
Glucose, Bld: 104 mg/dL — ABNORMAL HIGH (ref 65–99)
Potassium: 4.1 mmol/L (ref 3.5–5.3)
Sodium: 140 mmol/L (ref 135–146)

## 2020-11-11 LAB — VITAMIN B12: Vitamin B-12: >2000 pg/mL — ABNORMAL HIGH (ref 200–1100)

## 2020-11-17 ENCOUNTER — Other Ambulatory Visit: Payer: Self-pay

## 2020-11-17 ENCOUNTER — Ambulatory Visit
Admission: RE | Admit: 2020-11-17 | Discharge: 2020-11-17 | Disposition: A | Discharge status: Home / Self Care | Payer: Medicare Other | Source: Ambulatory Visit | Attending: Family Medicine | Admitting: Family Medicine

## 2020-11-17 ENCOUNTER — Ambulatory Visit
Admission: RE | Admit: 2020-11-17 | Discharge: 2020-11-17 | Disposition: A | Discharge status: Home / Self Care | Payer: Medicare Other | Attending: Family Medicine | Admitting: Family Medicine

## 2020-11-17 DIAGNOSIS — M25561 Pain in right knee: Secondary | ICD-10-CM | POA: Insufficient documentation

## 2020-11-17 DIAGNOSIS — G8929 Other chronic pain: Secondary | ICD-10-CM | POA: Insufficient documentation

## 2020-11-17 DIAGNOSIS — M25461 Effusion, right knee: Secondary | ICD-10-CM | POA: Diagnosis not present

## 2020-12-01 ENCOUNTER — Ambulatory Visit: Payer: Self-pay | Admitting: *Deleted

## 2020-12-01 NOTE — Telephone Encounter (Signed)
Pt called to review message from Dr. Ky Barban related to recent XRays. States she viewed in MyChart but was unable to read.  States she would like to proceed with steroid injections at practice.   Please advise: [phone]

## 2020-12-02 NOTE — Telephone Encounter (Signed)
Please review xray and Dr. Zella Ball note since she has left would you be willing to do steroid injections on her? Or place referral?

## 2020-12-03 NOTE — Telephone Encounter (Signed)
Please advise 

## 2020-12-03 NOTE — Telephone Encounter (Signed)
Daughter notified will need referral, she will talk to mom and call back

## 2021-02-03 DIAGNOSIS — Z23 Encounter for immunization: Secondary | ICD-10-CM | POA: Diagnosis not present

## 2021-02-06 ENCOUNTER — Encounter (INDEPENDENT_AMBULATORY_CARE_PROVIDER_SITE_OTHER): Payer: Medicare Other | Admitting: Ophthalmology

## 2021-02-06 ENCOUNTER — Other Ambulatory Visit: Payer: Self-pay

## 2021-02-06 DIAGNOSIS — I10 Essential (primary) hypertension: Secondary | ICD-10-CM

## 2021-02-06 DIAGNOSIS — H35033 Hypertensive retinopathy, bilateral: Secondary | ICD-10-CM | POA: Diagnosis not present

## 2021-02-06 DIAGNOSIS — H353231 Exudative age-related macular degeneration, bilateral, with active choroidal neovascularization: Secondary | ICD-10-CM | POA: Diagnosis not present

## 2021-02-06 DIAGNOSIS — D3131 Benign neoplasm of right choroid: Secondary | ICD-10-CM

## 2021-02-06 DIAGNOSIS — H43813 Vitreous degeneration, bilateral: Secondary | ICD-10-CM | POA: Diagnosis not present

## 2021-02-12 ENCOUNTER — Other Ambulatory Visit: Payer: Self-pay

## 2021-02-12 ENCOUNTER — Ambulatory Visit
Admission: EM | Admit: 2021-02-12 | Discharge: 2021-02-12 | Disposition: A | Discharge status: Home / Self Care | Payer: Medicare Other | Attending: Family Medicine | Admitting: Family Medicine

## 2021-02-12 DIAGNOSIS — U071 COVID-19: Secondary | ICD-10-CM | POA: Diagnosis not present

## 2021-02-12 DIAGNOSIS — R079 Chest pain, unspecified: Secondary | ICD-10-CM | POA: Diagnosis not present

## 2021-02-12 LAB — RESP PANEL BY RT-PCR (FLU A&B, COVID) ARPGX2
Influenza A by PCR: NEGATIVE
Influenza B by PCR: NEGATIVE
SARS Coronavirus 2 by RT PCR: POSITIVE — AB

## 2021-02-12 LAB — BASIC METABOLIC PANEL
Anion gap: 9 (ref 5–15)
BUN: 20 mg/dL (ref 8–23)
CO2: 31 mmol/L (ref 22–32)
Calcium: 9.2 mg/dL (ref 8.9–10.3)
Chloride: 99 mmol/L (ref 98–111)
Creatinine, Ser: 0.92 mg/dL (ref 0.44–1.00)
GFR, Estimated: >60 mL/min (ref >60)
Glucose, Bld: 106 mg/dL — ABNORMAL HIGH (ref 70–99)
Potassium: 3.8 mmol/L (ref 3.5–5.1)
Sodium: 139 mmol/L (ref 135–145)

## 2021-02-12 MED ORDER — NIRMATRELVIR/RITONAVIR (PAXLOVID)TABLET: 3.0000 | ORAL_TABLET | Freq: Two times a day (BID) | ORAL | 0 refills | Status: AC

## 2021-02-12 MED ORDER — NIRMATRELVIR/RITONAVIR (PAXLOVID)TABLET: 3.0000 | ORAL_TABLET | Freq: Two times a day (BID) | ORAL | 0 refills | Status: DC

## 2021-02-12 MED ORDER — IPRATROPIUM BROMIDE 0.06 % NA SOLN: 2.0000 | Freq: Four times a day (QID) | NASAL | 0 refills | Status: DC | PRN

## 2021-02-12 NOTE — ED Triage Notes (Signed)
Patient complains of cough, sore throat, nasal congestion, body aches and fatigue x 3 days. States that she is concerned she may have covid.

## 2021-02-12 NOTE — Discharge Instructions (Addendum)
Angela Duffy is 5 days from the onset of symptoms.   Stay home.  Medication as prescribed.  If you worsen (chest pain, fever, SOB), please go to the ER.  Take care  Dr. Lacinda Axon

## 2021-02-12 NOTE — ED Provider Notes (Signed)
[facility]    CSN: [id number] Arrival date & time: 02/12/21  0944      History   Chief Complaint Chief Complaint  Patient presents with   Cough   HPI  86 year old female presents with respiratory symptoms.  She states that she is on day 3 of symptoms.  She reports cough, postnasal drip, sore throat, hoarseness.  She states that her sputum is clear.  She denies any known sick contacts.  She states that she is concerned that she may have COVID-19.  Desires testing today.  She has no current shortness of breath.  She has had no fever.  States that she has not taken her blood pressure medication today.  Her blood pressure is markedly elevated.  She states that this is a common occurrence for her.  She states that this is due to anxiety.  No other reported symptoms.  No other complaints.  Past Medical History:  Diagnosis Date   Atherosclerosis    on Chest CT 08/2014; 70-80% innominate artery, right subclavian artery, 75% celiac artery, 3 vessel coronary artery involvement   CKD (chronic kidney disease) stage 3, GFR 30-59 ml/min (HCC)    Coronary atherosclerosis    History of tobacco use    Hyperlipidemia    Hypertension    IFG (impaired fasting glucose)    Left ventricular diastolic dysfunction    Left ventricular hypertrophy    Mitral valve regurgitation    Obesity    PVD (peripheral vascular disease) (HCC)    Renal artery atherosclerosis (HCC)    greater than 60% stenosis right renal artery   Tricuspid regurgitation    Vitamin B12 deficiency    Vitamin D deficiency disease     Patient Active Problem List   Diagnosis Date Noted   Knee pain 11/10/2020   Macular degeneration 02/05/2019   Full code status 06/29/2017   Hypokalemia 09/29/2016   Medication monitoring encounter 09/25/2015   Bursitis of left shoulder 07/26/2015   Microalbuminuria 04/24/2015   IFG (impaired fasting glucose)    Obesity    History of tobacco use    Stage 3a chronic kidney disease  (HCC)    Left ventricular diastolic dysfunction    Left ventricular hypertrophy    Mitral valve regurgitation    Tricuspid regurgitation    Coronary atherosclerosis    Atherosclerosis    Renal artery atherosclerosis ([place])    Mixed hyperlipidemia 09/23/2014   Benign essential hypertension 09/23/2014   PVD (peripheral vascular disease) ([place]) 09/23/2014    Past Surgical History:  Procedure Laterality Date   CAROTID SURGERY  Feb 2004   CERVICAL DISCECTOMY  1970s   ILIAC ARTERY STENT  2003   KIDNEY STENT  April 2016   LAPAROSCOPY  1970s    OB History   No obstetric history on file.      Home Medications    Prior to Admission medications   Medication Sig Start Date End Date Taking? Authorizing Provider  Bevacizumab (AVASTIN IV) Inject 1.25 mg into the eye every 6 (six) weeks. evry 12 weeks   Yes [provider]  clopidogrel (PLAVIX) 75 MG tablet Take 1 tablet (75 mg total) by mouth daily. 11/10/20  Yes Myles Gip, DO  ipratropium (ATROVENT) 0.06 % nasal spray Place 2 sprays into both nostrils 4 (four) times daily as needed for rhinitis. 02/12/21  Yes Rhapsody Wolven, Barnie Del, DO  nirmatrelvir/ritonavir EUA (PAXLOVID) TABS Take 3 tablets by mouth 2 (two) times daily for 5 days. Patient GFR  is >60. Take nirmatrelvir (150 mg) two tablets twice daily for 5 days and ritonavir (100 mg) one tablet twice daily for 5 days. 02/12/21 02/17/21 Yes Jaquetta Currier G, DO  rosuvastatin (CRESTOR) 20 MG tablet Take 1 tablet (20 mg total) by mouth daily. 11/10/20  Yes Myles Gip, DO  triamterene-hydrochlorothiazide (MAXZIDE-25) 37.5-25 MG tablet Take 1 tablet by mouth daily. 11/10/20  Yes Myles Gip, DO    Family History Family History  Problem Relation Age of Onset   Cancer Mother        colon   Emphysema Father    Heart disease Father    Heart attack Father    Cancer Sister 76       breast   Lupus Sister    Heart disease Sister    Multiple sclerosis Brother    Hypertension Sister     Cancer Daughter        breast   Cancer Paternal Grandmother        female    Social History Social History   Tobacco Use   Smoking status: Former    Packs/day: 2.00    Years: 64.00    Pack years: 128.00    Types: Cigarettes    Quit date: 04/14/2014    Years since quitting: 6.8   Smokeless tobacco: Never   Tobacco comments:    smoking cessation materials not required  Vaping Use   Vaping Use: Never used  Substance Use Topics   Alcohol use: Not Currently   Drug use: No     Allergies   Cozaar [losartan], Elemental sulfur, Felodipine, Lisinopril, Metoprolol, Neomycin, Nickel, and Penicillins   Review of Systems Review of Systems  Physical Exam Triage Vital Signs ED Triage Vitals  Enc Vitals Group     BP 02/12/21 1015 (!) 205/103     Pulse Rate 02/12/21 1015 75     Resp 02/12/21 1015 18     Temp 02/12/21 1015 98.9 F (37.2 C)     Temp Source 02/12/21 1015 Oral     SpO2 02/12/21 1015 97 %     Weight 02/12/21 1013 156 lb (70.8 kg)     Height 02/12/21 1013 5' (1.524 m)     Head Circumference --      Peak Flow --      Pain Score 02/12/21 1012 0     Pain Loc --      Pain Edu? --      Excl. in [place]? --    Updated Vital Signs BP (!) 208/99 (BP Location: Left Arm)   Pulse 75   Temp 98.9 F (37.2 C) (Oral)   Resp 18   Ht 5' (1.524 m)   Wt 70.8 kg   SpO2 97%   BMI 30.47 kg/m   Visual Acuity Right Eye Distance:   Left Eye Distance:   Bilateral Distance:    Right Eye Near:   Left Eye Near:    Bilateral Near:     Physical Exam Vitals and nursing note reviewed.  Constitutional:      General: She is not in acute distress.    Appearance: Normal appearance. She is not ill-appearing.  HENT:     Head: Normocephalic and atraumatic.     Mouth/Throat:     Pharynx: Oropharynx is clear. No oropharyngeal exudate.  Cardiovascular:     Rate and Rhythm: Regular rhythm. Tachycardia present.     Heart sounds: Murmur heard.  Pulmonary:     Effort: Pulmonary  effort  is normal.     Breath sounds: Normal breath sounds. No wheezing, rhonchi or rales.  Neurological:     Mental Status: She is alert.  Psychiatric:        Mood and Affect: Mood normal.        Behavior: Behavior normal.    UC Treatments / Results  Labs (all labs ordered are listed, but only abnormal results are displayed) Labs Reviewed  RESP PANEL BY RT-PCR (FLU A&B, COVID) ARPGX2 - Abnormal; Notable for the following components:      Result Value   SARS Coronavirus 2 by RT PCR POSITIVE (*)    All other components within normal limits  BASIC METABOLIC PANEL - Abnormal; Notable for the following components:   Glucose, Bld 106 (*)    All other components within normal limits    EKG Interpretation: Normal sinus rhythm with sinus arrhythmia.  Right bundle branch block.  No signs of ischemia.  Radiology No results found.  Procedures Procedures (including critical care time)  Medications Ordered in UC Medications - No data to display  Initial Impression / Assessment and Plan / UC Course  I have reviewed the triage vital signs and the nursing notes.  Pertinent labs & imaging results that were available during my care of the patient were reviewed by me and considered in my medical decision making (see chart for details).    86 year old female presents with respiratory symptoms.  Testing positive for COVID-19.  BMP obtained for evaluation of her renal function so that Paxlovid could be prescribed.  I have checked for interactions regarding her medications and Paxlovid.  Therapy monitoring recommended for Crestor and Plavix.  No direct contraindications to Paxlovid.  Rx sent. Atrovent to aid in postnasal drip. ER precautions given.  Final Clinical Impressions(s) / UC Diagnoses   Final diagnoses:  COVID     Discharge Instructions      Samule Dry is 5 days from the onset of symptoms.   Stay home.  Medication as prescribed.  If you worsen (chest pain, fever, SOB), please go  to the ER.  Take care  Dr. Lacinda Axon      ED Prescriptions     Medication Sig Dispense Auth. Provider   ipratropium (ATROVENT) 0.06 % nasal spray Place 2 sprays into both nostrils 4 (four) times daily as needed for rhinitis. 15 mL Mirabel Ahlgren G, DO   nirmatrelvir/ritonavir EUA (PAXLOVID) TABS Take 3 tablets by mouth 2 (two) times daily for 5 days. Patient GFR is >60. Take nirmatrelvir (150 mg) two tablets twice daily for 5 days and ritonavir (100 mg) one tablet twice daily for 5 days. 30 tablet Coral Spikes, DO      PDMP not reviewed this encounter.   Coral Spikes, [place] 02/12/21 1243

## 2021-03-02 ENCOUNTER — Other Ambulatory Visit: Payer: Self-pay | Admitting: Family Medicine

## 2021-03-02 DIAGNOSIS — I10 Essential (primary) hypertension: Secondary | ICD-10-CM

## 2021-04-29 DIAGNOSIS — H353132 Nonexudative age-related macular degeneration, bilateral, intermediate dry stage: Secondary | ICD-10-CM | POA: Diagnosis not present

## 2021-04-29 DIAGNOSIS — H40013 Open angle with borderline findings, low risk, bilateral: Secondary | ICD-10-CM | POA: Diagnosis not present

## 2021-05-15 ENCOUNTER — Encounter (INDEPENDENT_AMBULATORY_CARE_PROVIDER_SITE_OTHER): Payer: Medicare Other | Admitting: Ophthalmology

## 2021-05-15 ENCOUNTER — Other Ambulatory Visit: Payer: Self-pay

## 2021-05-15 DIAGNOSIS — H35033 Hypertensive retinopathy, bilateral: Secondary | ICD-10-CM

## 2021-05-15 DIAGNOSIS — H43813 Vitreous degeneration, bilateral: Secondary | ICD-10-CM | POA: Diagnosis not present

## 2021-05-15 DIAGNOSIS — H353231 Exudative age-related macular degeneration, bilateral, with active choroidal neovascularization: Secondary | ICD-10-CM

## 2021-05-15 DIAGNOSIS — I10 Essential (primary) hypertension: Secondary | ICD-10-CM | POA: Diagnosis not present

## 2021-05-15 DIAGNOSIS — D3131 Benign neoplasm of right choroid: Secondary | ICD-10-CM

## 2021-06-30 DIAGNOSIS — Z23 Encounter for immunization: Secondary | ICD-10-CM | POA: Diagnosis not present

## 2021-08-20 ENCOUNTER — Ambulatory Visit (INDEPENDENT_AMBULATORY_CARE_PROVIDER_SITE_OTHER): Payer: Medicare Other | Admitting: Internal Medicine

## 2021-08-20 ENCOUNTER — Encounter: Payer: Self-pay | Admitting: Internal Medicine

## 2021-08-20 ENCOUNTER — Other Ambulatory Visit: Payer: Self-pay

## 2021-08-20 VITALS — BP 149/63 | HR 74 | Temp 98.2°F | Resp 17 | Ht 60.0 in | Wt 151.8 lb

## 2021-08-20 DIAGNOSIS — E782 Mixed hyperlipidemia: Secondary | ICD-10-CM | POA: Diagnosis not present

## 2021-08-20 DIAGNOSIS — I739 Peripheral vascular disease, unspecified: Secondary | ICD-10-CM | POA: Diagnosis not present

## 2021-08-20 DIAGNOSIS — M1990 Unspecified osteoarthritis, unspecified site: Secondary | ICD-10-CM | POA: Insufficient documentation

## 2021-08-20 DIAGNOSIS — I2510 Atherosclerotic heart disease of native coronary artery without angina pectoris: Secondary | ICD-10-CM | POA: Diagnosis not present

## 2021-08-20 DIAGNOSIS — N1831 Chronic kidney disease, stage 3a: Secondary | ICD-10-CM

## 2021-08-20 DIAGNOSIS — Z6828 Body mass index (BMI) 28.0-28.9, adult: Secondary | ICD-10-CM | POA: Insufficient documentation

## 2021-08-20 DIAGNOSIS — I701 Atherosclerosis of renal artery: Secondary | ICD-10-CM

## 2021-08-20 DIAGNOSIS — R7303 Prediabetes: Secondary | ICD-10-CM | POA: Diagnosis not present

## 2021-08-20 DIAGNOSIS — E6609 Other obesity due to excess calories: Secondary | ICD-10-CM | POA: Insufficient documentation

## 2021-08-20 DIAGNOSIS — Z6829 Body mass index (BMI) 29.0-29.9, adult: Secondary | ICD-10-CM

## 2021-08-20 DIAGNOSIS — L989 Disorder of the skin and subcutaneous tissue, unspecified: Secondary | ICD-10-CM

## 2021-08-20 DIAGNOSIS — E663 Overweight: Secondary | ICD-10-CM | POA: Insufficient documentation

## 2021-08-20 DIAGNOSIS — N183 Chronic kidney disease, stage 3 unspecified: Secondary | ICD-10-CM | POA: Insufficient documentation

## 2021-08-20 DIAGNOSIS — I700 Atherosclerosis of aorta: Secondary | ICD-10-CM

## 2021-08-20 DIAGNOSIS — Z6827 Body mass index (BMI) 27.0-27.9, adult: Secondary | ICD-10-CM | POA: Insufficient documentation

## 2021-08-20 DIAGNOSIS — I10 Essential (primary) hypertension: Secondary | ICD-10-CM | POA: Diagnosis not present

## 2021-08-20 DIAGNOSIS — M170 Bilateral primary osteoarthritis of knee: Secondary | ICD-10-CM | POA: Diagnosis not present

## 2021-08-20 NOTE — Assessment & Plan Note (Signed)
Encouraged low carb diet A1C today

## 2021-08-20 NOTE — Assessment & Plan Note (Signed)
Continue Rosuvastatin and Plavix

## 2021-08-20 NOTE — Assessment & Plan Note (Signed)
Encouraged her to consume a low fat diet CMET and lipid profile today Continue Rosuvastatin and Plavix

## 2021-08-20 NOTE — Assessment & Plan Note (Signed)
CMET today 

## 2021-08-20 NOTE — Assessment & Plan Note (Signed)
Continue Triamterene HCT Reinforced DASH diet CMET today

## 2021-08-20 NOTE — Assessment & Plan Note (Signed)
Encouraged regular physical activity Continue Naproxen only as needed

## 2021-08-20 NOTE — Assessment & Plan Note (Signed)
CMET and lipid profile today Encouraged her to consume a low fat diet Continue Rosuvastatin 

## 2021-08-20 NOTE — Progress Notes (Signed)
HPI  Pt presents to the clinic today to establish care and for management of the conditions listed below.  CKD 3: Her last creatinine was 0.92, GFR 60, 02/2021.  She is not taking anything for renal protection.  She does not follow with nephrology.  HLD with CAD, PVD and Aortic Atherosclerosis: Her last LDL was 92, triglycerides 45, 11/2020.  She denies myalgias on Rosuvastatin.  She is taking Plavix as well. She does not consume a low fat diet.  HTN: Her BP today is 149/63.  She is taking Triamterene HCT as prescribed.  ECG from 02/2021 reviewed.  Prediabetes: Her last A1c was 5.7%, 11/2020.  She is not taking any oral diabetic medication at this time.  She does not check her sugars.  OA: Mainly in her knees. She takes Naproxen as needed with good relief of symptoms.   Past Medical History:  Diagnosis Date   Atherosclerosis    on Chest CT 08/2014; 70-80% innominate artery, right subclavian artery, 75% celiac artery, 3 vessel coronary artery involvement   CKD (chronic kidney disease) stage 3, GFR 30-59 ml/min (HCC)    Coronary atherosclerosis    History of tobacco use    Hyperlipidemia    Hypertension    IFG (impaired fasting glucose)    Left ventricular diastolic dysfunction    Left ventricular hypertrophy    Mitral valve regurgitation    Obesity    PVD (peripheral vascular disease) (HCC)    Renal artery atherosclerosis (HCC)    greater than 60% stenosis right renal artery   Tricuspid regurgitation    Vitamin B12 deficiency    Vitamin D deficiency disease     Current Outpatient Medications  Medication Sig Dispense Refill   Bevacizumab (AVASTIN IV) Inject 1.25 mg into the eye every 6 (six) weeks. evry 12 weeks     clopidogrel (PLAVIX) 75 MG tablet Take 1 tablet (75 mg total) by mouth daily. 90 tablet 3   ipratropium (ATROVENT) 0.06 % nasal spray Place 2 sprays into both nostrils 4 (four) times daily as needed for rhinitis. 15 mL 0   rosuvastatin (CRESTOR) 20 MG tablet Take 1  tablet (20 mg total) by mouth daily. 90 tablet 3   triamterene-hydrochlorothiazide (MAXZIDE-25) 37.5-25 MG tablet Take 1 tablet by mouth daily. 90 tablet 0   No current facility-administered medications for this visit.    Allergies  Allergen Reactions   Cozaar [Losartan] Other (See Comments)    cramps   Elemental Sulfur Diarrhea and Nausea And Vomiting   Felodipine Swelling   Lisinopril Other (See Comments)   Metoprolol Swelling   Neomycin Rash   Nickel Rash   Penicillins Rash    Family History  Problem Relation Age of Onset   Cancer Mother        colon   Emphysema Father    Heart disease Father    Heart attack Father    Cancer Sister 58       breast   Lupus Sister    Heart disease Sister    Multiple sclerosis Brother    Hypertension Sister    Cancer Daughter        breast   Cancer Paternal Grandmother        female    Social History   Socioeconomic History   Marital status: Divorced    Spouse name: Not on file   Number of children: 3   Years of education: Not on file   Highest education level: 11th grade  Occupational  History    Employer: RETIRED  Tobacco Use   Smoking status: Former    Packs/day: 2.00    Years: 64.00    Pack years: 128.00    Types: Cigarettes    Quit date: 04/14/2014    Years since quitting: 7.3   Smokeless tobacco: Never   Tobacco comments:    smoking cessation materials not required  Vaping Use   Vaping Use: Never used  Substance and Sexual Activity   Alcohol use: Not Currently   Drug use: No   Sexual activity: Not Currently  Other Topics Concern   Not on file  Social History Narrative   Not on file   Social Determinants of Health   Financial Resource Strain: Not on file  Food Insecurity: Not on file  Transportation Needs: Not on file  Physical Activity: Not on file  Stress: Not on file  Social Connections: Not on file  Intimate Partner Violence: Not on file    ROS:  Constitutional: Denies fever, malaise, fatigue,  headache or abrupt weight changes.  HEENT: Denies eye pain, eye redness, ear pain, ringing in the ears, wax buildup, runny nose, nasal congestion, bloody nose, or sore throat. Respiratory: Denies difficulty breathing, shortness of breath, cough or sputum production.   Cardiovascular: Denies chest pain, chest tightness, palpitations or swelling in the hands or feet.  Gastrointestinal: Denies abdominal pain, bloating, constipation, diarrhea or blood in the stool.  GU: Denies frequency, urgency, pain with urination, blood in urine, odor or discharge. Musculoskeletal: Pt reports joint pain. Denies decrease in range of motion, difficulty with gait, muscle pain or joint swelling.  Skin: Pt reports skin lesion of left arm. Denies redness, rashes, or ulcercations.  Neurological: Denies dizziness, difficulty with memory, difficulty with speech or problems with balance and coordination.  Psych: Denies anxiety, depression, SI/HI.  No other specific complaints in a complete review of systems (except as listed in HPI above).  PE: BP (!) 149/63 (BP Location: Right Arm, Patient Position: Sitting, Cuff Size: Normal)    Pulse 74    Temp 98.2 F (36.8 C) (Temporal)    Resp 17    Ht 5' (1.524 m)    Wt 151 lb 12.8 oz (68.9 kg)    SpO2 98%    BMI 29.65 kg/m   Wt Readings from Last 3 Encounters:  02/12/21 156 lb (70.8 kg)  11/10/20 151 lb 6.4 oz (68.7 kg)  04/07/20 155 lb 1.6 oz (70.4 kg)    General: Appears her stated age, overweight in NAD. HEENT: Head: normal shape and size; Eyes: sclera white and EOMs intact;  Neck: Neck supple, trachea midline. No masses, lumps or thyromegaly present.  Cardiovascular: Normal rate and rhythm. S1,S2 noted.  No murmur, rubs or gallops noted. No JVD or BLE edema. No carotid bruits noted. Pulmonary/Chest: Normal effort and positive vesicular breath sounds. No respiratory distress. No wheezes, rales or ronchi noted.  Abdomen: Soft and nontender. Musculoskeletal: No difficulty  with gait.  Neurological: Alert and oriented. Cranial nerves II-XII grossly intact. Coordination normal.  Psychiatric: Mood and affect normal. Behavior is normal. Judgment and thought content normal.   BMET    Component Value Date/Time   NA 139 02/12/2021 1125   NA 142 09/25/2015 1135   K 3.8 02/12/2021 1125   CL 99 02/12/2021 1125   CO2 31 02/12/2021 1125   GLUCOSE 106 (H) 02/12/2021 1125   BUN 20 02/12/2021 1125   BUN 12 09/25/2015 1135   BUN 18 12/17/2014 1418  CREATININE 0.92 02/12/2021 1125   CREATININE 0.82 11/10/2020 0000   CALCIUM 9.2 02/12/2021 1125   GFRNONAA >60 02/12/2021 1125   GFRNONAA 59 (L) 12/04/2019 0933   GFRAA 68 12/04/2019 0933    Lipid Panel     Component Value Date/Time   CHOL 165 11/10/2020 0000   CHOL 218 (H) 09/25/2015 1135   TRIG 186 (H) 11/10/2020 0000   HDL 45 (L) 11/10/2020 0000   HDL 50 09/25/2015 1135   CHOLHDL 3.7 11/10/2020 0000   VLDL 22 04/18/2017 0909   LDLCALC 92 11/10/2020 0000    CBC    Component Value Date/Time   WBC 5.3 04/24/2018 0924   RBC 4.46 04/24/2018 0924   HGB 12.3 04/24/2018 0924   HGB 11.1 09/25/2015 1135   HCT 37.5 04/24/2018 0924   HCT 34.7 09/25/2015 1135   PLT 194 04/24/2018 0924   PLT 258 09/25/2015 1135   MCV 84.1 04/24/2018 0924   MCV 82 09/25/2015 1135   MCH 27.6 04/24/2018 0924   MCHC 32.8 04/24/2018 0924   RDW 14.3 04/24/2018 0924   RDW 16.8 (H) 09/25/2015 1135   LYMPHSABS 583 (L) 04/24/2018 0924   LYMPHSABS 0.8 09/25/2015 1135   MONOABS 468 12/28/2016 1043   EOSABS 21 04/24/2018 0924   EOSABS 0.1 09/25/2015 1135   BASOSABS 32 04/24/2018 0924   BASOSABS 0.0 09/25/2015 1135    Hgb A1C Lab Results  Component Value Date   HGBA1C 5.7 (A) 11/10/2020     Assessment and Plan:  Skin Lesion of Left Arm:  Referral to dermatology for further evaluation, r/o skin cancer  RTC in 6 months, Medicare Wellness Exam  Webb Silversmith, NP This visit occurred during the SARS-CoV-2 public health  emergency.  Safety protocols were in place, including screening questions prior to the visit, additional usage of staff PPE, and extensive cleaning of exam room while observing appropriate contact time as indicated for disinfecting solutions.

## 2021-08-20 NOTE — Assessment & Plan Note (Signed)
Encouraged diet and exercise for weight loss ?

## 2021-08-20 NOTE — Patient Instructions (Signed)
Heart-Healthy Eating Plan Heart-healthy meal planning includes: Eating less unhealthy fats. Eating more healthy fats. Making other changes in your diet. Talk with your doctor or a diet specialist (dietitian) to create an eating plan that is right for you. What is my plan? Your doctor may recommend an eating plan that includes: Total fat: ______% or less of total calories a day. Saturated fat: ______% or less of total calories a day. Cholesterol: less than _________mg a day. What are tips for following this plan? Cooking Avoid frying your food. Try to bake, boil, grill, or broil it instead. You can also reduce fat by: Removing the skin from poultry. Removing all visible fats from meats. Steaming vegetables in water or broth. Meal planning  At meals, divide your plate into four equal parts: Fill one-half of your plate with vegetables and green salads. Fill one-fourth of your plate with whole grains. Fill one-fourth of your plate with lean protein foods. Eat 4-5 servings of vegetables per day. A serving of vegetables is: 1 cup of raw or cooked vegetables. 2 cups of raw leafy greens. Eat 4-5 servings of fruit per day. A serving of fruit is: 1 medium whole fruit.  cup of dried fruit.  cup of fresh, frozen, or canned fruit.  cup of 100% fruit juice. Eat more foods that have soluble fiber. These are apples, broccoli, carrots, beans, peas, and barley. Try to get 20-30 g of fiber per day. Eat 4-5 servings of nuts, legumes, and seeds per week: 1 serving of dried beans or legumes equals  cup after being cooked. 1 serving of nuts is  cup. 1 serving of seeds equals 1 tablespoon. General information Eat more home-cooked food. Eat less restaurant, buffet, and fast food. Limit or avoid alcohol. Limit foods that are high in starch and sugar. Avoid fried foods. Lose weight if you are overweight. Keep track of how much salt (sodium) you eat. This is important if you have high blood  pressure. Ask your doctor to tell you more about this. Try to add vegetarian meals each week. Fats Choose healthy fats. These include olive oil and canola oil, flaxseeds, walnuts, almonds, and seeds. Eat more omega-3 fats. These include salmon, mackerel, sardines, tuna, flaxseed oil, and ground flaxseeds. Try to eat fish at least 2 times each week. Check food labels. Avoid foods with trans fats or high amounts of saturated fat. Limit saturated fats. These are often found in animal products, such as meats, butter, and cream. These are also found in plant foods, such as palm oil, palm kernel oil, and coconut oil. Avoid foods with partially hydrogenated oils in them. These have trans fats. Examples are stick margarine, some tub margarines, cookies, crackers, and other baked goods. What foods can I eat? Fruits All fresh, canned (in natural juice), or frozen fruits. Vegetables Fresh or frozen vegetables (raw, steamed, roasted, or grilled). Green salads. Grains Most grains. Choose whole wheat and whole grains most of the time. Rice and pasta, including brown rice and pastas made with whole wheat. Meats and other proteins Lean, well-trimmed beef, veal, pork, and lamb. Chicken and turkey without skin. All fish and shellfish. Wild duck, rabbit, pheasant, and venison. Egg whites or low-cholesterol egg substitutes. Dried beans, peas, lentils, and tofu. Seeds and most nuts. Dairy Low-fat or nonfat cheeses, including ricotta and mozzarella. Skim or 1% milk that is liquid, powdered, or evaporated. Buttermilk that is made with low-fat milk. Nonfat or low-fat yogurt. Fats and oils Non-hydrogenated (trans-free) margarines. Vegetable oils, including   soybean, sesame, sunflower, olive, peanut, safflower, corn, canola, and cottonseed. Salad dressings or mayonnaise made with a vegetable oil. Beverages Mineral water. Coffee and tea. Diet carbonated beverages. Sweets and desserts Sherbet, gelatin, and fruit ice.  Small amounts of dark chocolate. Limit all sweets and desserts. Seasonings and condiments All seasonings and condiments. The items listed above may not be a complete list of foods and drinks you can eat. Contact a dietitian for more options. What foods should I avoid? Fruits Canned fruit in heavy syrup. Fruit in cream or butter sauce. Fried fruit. Limit coconut. Vegetables Vegetables cooked in cheese, cream, or butter sauce. Fried vegetables. Grains Breads that are made with saturated or trans fats, oils, or whole milk. Croissants. Sweet rolls. Donuts. High-fat crackers, such as cheese crackers. Meats and other proteins Fatty meats, such as hot dogs, ribs, sausage, bacon, rib-eye roast or steak. High-fat deli meats, such as salami and bologna. Caviar. Domestic duck and goose. Organ meats, such as liver. Dairy Cream, sour cream, cream cheese, and creamed cottage cheese. Whole-milk cheeses. Whole or 2% milk that is liquid, evaporated, or condensed. Whole buttermilk. Cream sauce or high-fat cheese sauce. Yogurt that is made from whole milk. Fats and oils Meat fat, or shortening. Cocoa butter, hydrogenated oils, palm oil, coconut oil, palm kernel oil. Solid fats and shortenings, including bacon fat, salt pork, lard, and butter. Nondairy cream substitutes. Salad dressings with cheese or sour cream. Beverages Regular sodas and juice drinks with added sugar. Sweets and desserts Frosting. Pudding. Cookies. Cakes. Pies. Milk chocolate or white chocolate. Buttered syrups. Full-fat ice cream or ice cream drinks. The items listed above may not be a complete list of foods and drinks to avoid. Contact a dietitian for more information. Summary Heart-healthy meal planning includes eating less unhealthy fats, eating more healthy fats, and making other changes in your diet. Eat a balanced diet. This includes fruits and vegetables, low-fat or nonfat dairy, lean protein, nuts and legumes, whole grains, and  heart-healthy oils and fats. This information is not intended to replace advice given to you by your health care provider. Make sure you discuss any questions you have with your health care provider. Document Revised: 01/01/2021 Document Reviewed: 01/01/2021 Elsevier Patient Education  2022 [employer].  

## 2021-08-21 LAB — COMPLETE METABOLIC PANEL WITH GFR
AG Ratio: 1.8 (calc) (ref 1.0–2.5)
ALT: 14 U/L (ref 6–29)
AST: 23 U/L (ref 10–35)
Albumin: 4.2 g/dL (ref 3.6–5.1)
Alkaline phosphatase (APISO): 62 U/L (ref 37–153)
BUN/Creatinine Ratio: 11 (calc) (ref 6–22)
BUN: 11 mg/dL (ref 7–25)
CO2: 32 mmol/L (ref 20–32)
Calcium: 9.6 mg/dL (ref 8.6–10.4)
Chloride: 102 mmol/L (ref 98–110)
Creat: 1 mg/dL — ABNORMAL HIGH (ref 0.60–0.95)
Globulin: 2.3 g/dL (calc) (ref 1.9–3.7)
Glucose, Bld: 104 mg/dL — ABNORMAL HIGH (ref 65–99)
Potassium: 4.3 mmol/L (ref 3.5–5.3)
Sodium: 143 mmol/L (ref 135–146)
Total Bilirubin: 0.6 mg/dL (ref 0.2–1.2)
Total Protein: 6.5 g/dL (ref 6.1–8.1)
eGFR: 56 mL/min/{1.73_m2} — ABNORMAL LOW (ref >=60)

## 2021-08-21 LAB — LIPID PANEL
Cholesterol: 194 mg/dL (ref <200)
HDL: 53 mg/dL (ref >=50)
LDL Cholesterol (Calc): 111 mg/dL (calc) — ABNORMAL HIGH
Non-HDL Cholesterol (Calc): 141 mg/dL (calc) — ABNORMAL HIGH (ref <130)
Total CHOL/HDL Ratio: 3.7 (calc) (ref <5.0)
Triglycerides: 180 mg/dL — ABNORMAL HIGH (ref <150)

## 2021-08-21 LAB — HEMOGLOBIN A1C
Hgb A1c MFr Bld: 6 % of total Hgb — ABNORMAL HIGH (ref <5.7)
Mean Plasma Glucose: 126 mg/dL
eAG (mmol/L): 7 mmol/L

## 2021-09-18 ENCOUNTER — Other Ambulatory Visit: Payer: Self-pay

## 2021-09-18 ENCOUNTER — Encounter (INDEPENDENT_AMBULATORY_CARE_PROVIDER_SITE_OTHER): Payer: Medicare Other | Admitting: Ophthalmology

## 2021-09-18 DIAGNOSIS — H353231 Exudative age-related macular degeneration, bilateral, with active choroidal neovascularization: Secondary | ICD-10-CM

## 2021-09-18 DIAGNOSIS — D3131 Benign neoplasm of right choroid: Secondary | ICD-10-CM

## 2021-09-18 DIAGNOSIS — H35033 Hypertensive retinopathy, bilateral: Secondary | ICD-10-CM

## 2021-09-18 DIAGNOSIS — H43813 Vitreous degeneration, bilateral: Secondary | ICD-10-CM

## 2021-09-18 DIAGNOSIS — I10 Essential (primary) hypertension: Secondary | ICD-10-CM | POA: Diagnosis not present

## 2021-12-30 ENCOUNTER — Telehealth: Payer: Self-pay

## 2021-12-30 NOTE — Telephone Encounter (Signed)
I called the patient to schedule  her for her Medicare Wellness Appt. She requested that I cancel her appt scheduled for 6/15/203 at [facility] because the lesion is almost resolved. I informed the patient that she would need to cancel the appt since she scheduled it. She informed me that she did not schedule the appt and that Rollene Fare placed a referral and several weeks later she received a note in the mail from [facility] with the appt information. I called [facility], no answer. I left a detail message on the voicemail requesting to cancel the appt with the patient contact information.  ?

## 2022-01-04 ENCOUNTER — Ambulatory Visit (INDEPENDENT_AMBULATORY_CARE_PROVIDER_SITE_OTHER): Payer: Medicare Other

## 2022-01-04 DIAGNOSIS — Z0000 Encounter for general adult medical examination without abnormal findings: Secondary | ICD-10-CM | POA: Diagnosis not present

## 2022-01-04 NOTE — Patient Instructions (Signed)
Fat and Cholesterol Restricted Eating Plan ?Eating a diet that limits fat and cholesterol may help lower your risk for heart disease and other conditions. Your body needs fat and cholesterol for basic functions, but eating too much of these things can be harmful to your health. ?Your health care provider may order lab tests to check your blood fat (lipid) and cholesterol levels. This helps your health care provider understand your risk for certain conditions and whether you need to make diet changes. Work with your health care provider or dietitian to make an eating plan that is right for you. ?Your plan includes: ?Limit your fat intake to ______% or less of your total calories a day. This is ______g of fat per day. ?Limit your saturated fat intake to ______% or less of your total calories a day. This is ______g of saturated fat per day. ?Limit the amount of cholesterol in your diet to less than _________mg a day. ?Eat ___________ g of fiber a day. ?What are tips for following this plan? ?General guidelines ?If you are overweight, work with your health care provider to lose weight safely. Losing just 5-10% of your body weight can improve your overall health and help prevent diseases such as diabetes and heart disease. ?Avoid: ?Foods with added sugar. ?Fried foods. ?Foods that contain partially hydrogenated oils, including stick margarine, some tub margarines, cookies, crackers, and other baked goods. ?If you drink alcohol: ?Limit how much you have to: ?0-1 drink a day for women who are not pregnant. ?0-2 drinks a day for men. ?Know how much alcohol is in a drink. In the [place] one drink equals one 12 oz bottle of beer (355 mL), one 5 oz glass of wine (148 mL), or one 1? oz glass of hard liquor (44 mL). ?Reading food labels ?Check food labels for: ?Trans fats or partially hydrogenated oils. Avoid foods that contain these. ?High amounts of saturated fat. Choose foods that are low in saturated fat (less than 2 g). ?The  amount of cholesterol in each serving. ?The amount of fiber in each serving. ?Choose foods with healthy fats, such as: ?Monounsaturated and polyunsaturated fats. These include olive and canola oil, flaxseeds, walnuts, almonds, and seeds. ?Omega-3 fats. These are found in foods such as salmon, mackerel, sardines, tuna, flaxseed oil, and ground flaxseeds. ?Choose grain products that have whole grains. Look for the word "whole" as the first word in the ingredient list. ?Cooking ?Cook foods using methods other than frying. Baking, boiling, grilling, and broiling are some healthy options. ?Eat more home-cooked food and less restaurant, buffet, and fast food. ?Avoid cooking using saturated fats. ?Animal sources of saturated fats include meats, butter, and cream. ?Plant sources of saturated fats include palm oil, palm kernel oil, and coconut oil. ?Meal planning ? ?At meals, imagine dividing your plate into fourths: ?Fill one-half of your plate with vegetables, green salads, and fruit. ?Fill one-fourth of your plate with whole grains. ?Fill one-fourth of your plate with lean protein foods. ?Eat fish that is high in omega-3 fats at least two times a week. ?Eat more foods that contain fiber, such as whole grains, beans, apples, pears, berries, broccoli, carrots, peas, and barley. These foods help promote healthy cholesterol levels in the blood. ?What foods should I eat? ?Fruits ?All fresh, canned (in natural juice), or frozen fruits. ?Vegetables ?Fresh or frozen vegetables (raw, steamed, roasted, or grilled). Green salads. ?Grains ?Whole grains, such as whole wheat or whole grain breads, crackers, cereals, and pasta. Unsweetened oatmeal, bulgur,  barley, quinoa, or brown rice. Corn or whole wheat flour tortillas. ?Meats and other proteins ?Ground beef (85% or leaner), grass-fed beef, or beef trimmed of fat. Skinless chicken or [place]. Ground chicken or [place]. Pork trimmed of fat. All fish and seafood. Egg whites. Dried beans,  peas, or lentils. Unsalted nuts or seeds. Unsalted canned beans. Natural nut butters without added sugar and oil. ?Dairy ?Low-fat or nonfat dairy products, such as skim or 1% milk, 2% or reduced-fat cheeses, low-fat and fat-free ricotta or cottage cheese, or plain low-fat and nonfat yogurt. ?Fats and oils ?Tub margarine without trans fats. Light or reduced-fat mayonnaise and salad dressings. Avocado. Olive, canola, sesame, or safflower oils. ?The items listed above may not be a complete list of foods and beverages you can eat. Contact a dietitian for more information. ?What foods should I avoid? ?Fruits ?Canned fruit in heavy syrup. Fruit in cream or butter sauce. Fried fruit. ?Vegetables ?Vegetables cooked in cheese, cream, or butter sauce. Fried vegetables. ?Grains ?White bread. White pasta. White rice. Cornbread. Bagels, pastries, and croissants. Crackers and snack foods that contain trans fat and hydrogenated oils. ?Meats and other proteins ?Fatty cuts of meat. Ribs, chicken wings, bacon, sausage, bologna, salami, chitterlings, fatback, hot dogs, bratwurst, and packaged lunch meats. Liver and organ meats. Whole eggs and egg yolks. Chicken and [place] with skin. Fried meat. ?Dairy ?Whole or 2% milk, cream, half-and-half, and cream cheese. Whole milk cheeses. Whole-fat or sweetened yogurt. Full-fat cheeses. Nondairy creamers and whipped toppings. Processed cheese, cheese spreads, and cheese curds. ?Fats and oils ?Butter, stick margarine, lard, shortening, ghee, or bacon fat. Coconut, palm kernel, and palm oils. ?Beverages ?Alcohol. Sugar-sweetened drinks such as sodas, lemonade, and fruit drinks. ?Sweets and desserts ?Corn syrup, sugars, honey, and molasses. Candy. Jam and jelly. Syrup. Sweetened cereals. Cookies, pies, cakes, donuts, muffins, and ice cream. ?The items listed above may not be a complete list of foods and beverages you should avoid. Contact a dietitian for more information. ?Summary ?Your body needs  fat and cholesterol for basic functions. However, eating too much of these things can be harmful to your health. ?Work with your health care provider and dietitian to follow a diet that limits fat and cholesterol. Doing this may help lower your risk for heart disease and other conditions. ?Choose healthy fats, such as monounsaturated and polyunsaturated fats, and foods high in omega-3 fatty acids. ?Eat fiber-rich foods, such as whole grains, beans, peas, fruits, and vegetables. ?Limit or avoid alcohol, fried foods, and foods high in saturated fats, partially hydrogenated oils, and sugar. ?This information is not intended to replace advice given to you by your health care provider. Make sure you discuss any questions you have with your health care provider. ?Document Revised: 01/02/2021 Document Reviewed: 01/02/2021 ?Elsevier Patient Education ? [place]. ? ?Health Maintenance, Female ?Adopting a healthy lifestyle and getting preventive care are important in promoting health and wellness. Ask your health care provider about: ?The right schedule for you to have regular tests and exams. ?Things you can do on your own to prevent diseases and keep yourself healthy. ?What should I know about diet, weight, and exercise? ?Eat a healthy diet ? ?Eat a diet that includes plenty of vegetables, fruits, low-fat dairy products, and lean protein. ?Do not eat a lot of foods that are high in solid fats, added sugars, or sodium. ?Maintain a healthy weight ?Body mass index (BMI) is used to identify weight problems. It estimates body fat based on height and weight.  Your health care provider can help determine your BMI and help you achieve or maintain a healthy weight. ?Get regular exercise ?Get regular exercise. This is one of the most important things you can do for your health. Most adults should: ?Exercise for at least 150 minutes each week. The exercise should increase your heart rate and make you sweat (moderate-intensity  exercise). ?Do strengthening exercises at least twice a week. This is in addition to the moderate-intensity exercise. ?Spend less time sitting. Even light physical activity can be beneficial. ?Watch

## 2022-01-04 NOTE — Progress Notes (Signed)
? ? ? ? ? ?I connected with Angela Duffy today by telephone and verified that I am speaking with the correct person using two identifiers. ?Location patient: home ?Location provider: work ?Persons participating in the virtual visit:  Angela Duffy, Angela Savage, CNA ?  ?I discussed the limitations, risks, security and privacy concerns of performing an evaluation and management service by telephone and the availability of in person appointments. I also discussed with the patient that there may be a patient responsible charge related to this service. The patient expressed understanding and verbally consented to this telephonic visit.  ?  ? ? ?Subjective:  ? Angela Duffy is a 87 y.o. female who presents for Medicare Annual (Subsequent) preventive examination. ? ?Review of Systems    ?No other specific complaints in a complete review of systems (except as listed below.) ?  ?Psych: Denies anxiety, depression, SI/HI. Pt reports that she is a little sad some days with her daughter recent diagnose of terminal bone cancer. ?   ?Objective:  ?  ?There were no vitals filed for this visit. ?There is no height or weight on file to calculate BMI. ? ? ?  02/12/2021  ? 10:16 AM 04/20/2018  ?  2:38 PM 06/29/2017  ?  9:57 AM 12/28/2016  ?  9:58 AM 07/07/2016  ? 11:40 AM 06/29/2016  ?  2:01 PM 06/18/2016  ?  9:47 AM  ?Advanced Directives  ?Does Patient Have a Medical Advance Directive? No No No No No No No  ?Would patient like information on creating a medical advance directive?  No - Patient declined   No - patient declined information No - patient declined information No - patient declined information  ? ? ?Current Medications (verified) ?Outpatient Encounter Medications as of 01/04/2022  ?Medication Sig  ? Bevacizumab (AVASTIN IV) Inject 1.25 mg into the eye every 6 (six) weeks. evry 12 weeks  ? clopidogrel (PLAVIX) 75 MG tablet Take 1 tablet (75 mg total) by mouth daily.  ? Multiple Vitamins-Minerals  (PRESERVISION AREDS 2 PO) Take by mouth.  ? rosuvastatin (CRESTOR) 20 MG tablet Take 1 tablet (20 mg total) by mouth daily.  ? triamterene-hydrochlorothiazide (MAXZIDE-25) 37.5-25 MG tablet Take 1 tablet by mouth daily.  ? ?No facility-administered encounter medications on file as of 01/04/2022.  ? ? ?Allergies (verified) ?Cozaar [losartan], Elemental sulfur, Felodipine, Lisinopril, Metoprolol, Neomycin, Nickel, and Penicillins  ? ?History: ?Past Medical History:  ?Diagnosis Date  ? Atherosclerosis   ? on Chest CT 08/2014; 70-80% innominate artery, right subclavian artery, 75% celiac artery, 3 vessel coronary artery involvement  ? CKD (chronic kidney disease) stage 3, GFR 30-59 ml/min (HCC)   ? Coronary atherosclerosis   ? History of tobacco use   ? Hyperlipidemia   ? Hypertension   ? IFG (impaired fasting glucose)   ? Left ventricular diastolic dysfunction   ? Left ventricular hypertrophy   ? Mitral valve regurgitation   ? Obesity   ? PVD (peripheral vascular disease) ([place])   ? Renal artery atherosclerosis ([place])   ? greater than 60% stenosis right renal artery  ? Tricuspid regurgitation   ? Vitamin B12 deficiency   ? Vitamin D deficiency disease   ? ?Past Surgical History:  ?Procedure Laterality Date  ? CAROTID SURGERY  Feb 2004  ? CERVICAL DISCECTOMY  1970s  ? ILIAC ARTERY STENT  2003  ? KIDNEY STENT  April 2016  ? LAPAROSCOPY  1970s  ? ?Family History  ?Problem Relation Age of Onset  ?  Cancer Mother   ?     colon  ? Emphysema Father   ? Heart disease Father   ? Heart attack Father   ? Cancer Sister 62  ?     breast  ? Lupus Sister   ? Heart disease Sister   ? Multiple sclerosis Brother   ? Hypertension Sister   ? Cancer Daughter   ?     breast  ? Cancer Paternal Grandmother   ?     female  ? ?Social History  ? ?Socioeconomic History  ? Marital status: Divorced  ?  Spouse name: Not on file  ? Number of children: 3  ? Years of education: Not on file  ? Highest education level: 11th grade  ?Occupational History  ?   Employer: RETIRED  ?Tobacco Use  ? Smoking status: Former  ?  Packs/day: 2.00  ?  Years: 64.00  ?  Pack years: 128.00  ?  Types: Cigarettes  ?  Quit date: 04/14/2014  ?  Years since quitting: 7.7  ? Smokeless tobacco: Never  ? Tobacco comments:  ?  smoking cessation materials not required  ?Vaping Use  ? Vaping Use: Never used  ?Substance and Sexual Activity  ? Alcohol use: Not Currently  ? Drug use: No  ? Sexual activity: Not Currently  ?Other Topics Concern  ? Not on file  ?Social History Narrative  ? Not on file  ? ?Social Determinants of Health  ? ?Financial Resource Strain: Low Risk   ? Difficulty of Paying Living Expenses: Not hard at all  ?Food Insecurity: No Food Insecurity  ? Worried About Charity fundraiser in the Last Year: Never true  ? Ran Out of Food in the Last Year: Never true  ?Transportation Needs: No Transportation Needs  ? Lack of Transportation (Medical): No  ? Lack of Transportation (Non-Medical): No  ?Physical Activity: Sufficiently Active  ? Days of Exercise per Week: 7 days  ? Minutes of Exercise per Session: 30 min  ?Stress: No Stress Concern Present  ? Feeling of Stress : Not at all  ?Social Connections: Socially Isolated  ? Frequency of Communication with Friends and Family: More than three times a week  ? Frequency of Social Gatherings with Friends and Family: Once a week  ? Attends Religious Services: Never  ? Active Member of Clubs or Organizations: No  ? Attends Archivist Meetings: Never  ? Marital Status: Divorced  ? ? ?Tobacco Counseling ?Counseling given: Not Answered ?Tobacco comments: smoking cessation materials not required ? ? ?Clinical Intake: ? ?Pre-visit preparation completed: No ? ?Pain : No/denies pain ? ?  ? ?Nutritional Status: BMI 25 -29 Overweight ?Nutritional Risks: None ?Diabetes: No ?Pt eat mostly salads, sandwiches, chicken and frozen foods. She does not do a lot of cooking, because she lives alone. She state that she eats when she is hungry. ? ?How often  do you need to have someone help you when you read instructions, pamphlets, or other written materials from your doctor or pharmacy?: 1 - Never ? ? ?Interpreter Needed?: No ? ?Information entered by :: Donnie Mesa, CMA ? ? ?Activities of Daily Living ? ?  01/04/2022  ?  1:54 PM  ?In your present state of health, do you have any difficulty performing the following activities:  ?Hearing? 1  ?Vision? 1  ?Difficulty concentrating or making decisions? 0  ?Walking or climbing stairs? 1  ?Dressing or bathing? 0  ?Doing errands, shopping? 1  ? ? ?  Patient Care Team: ?Jearld Fenton, NP as PCP - General (Internal Medicine) ?Teodoro Spray, MD as Consulting Physician (Cardiology) ?Schnier, Dolores Lory, MD (Vascular Surgery) ?Hayden Pedro, MD as Consulting Physician (Ophthalmology) ? ?Indicate any recent Medical Services you may have received from other than [facility] providers in the past year (date may be approximate). ?No Hospitalization in the past 12 months  ?   ?Assessment:  ? This is a routine wellness examination for [place]. ? ?Hearing/Vision screen ?No results found. ? ?Dietary issues and exercise activities discussed: ?Current Exercise Habits: Home exercise routine;Structured exercise class, Type of exercise: walking, Time (Minutes): 20, Frequency (Times/Week): 7, Weekly Exercise (Minutes/Week): 140, Intensity: Mild ? ? Goals Addressed   ?None ?  ?Goal: Remain healthy as possible by staying active. Also try to lose about 4lbs to get down to 150lbs.  ? ?Depression Screen ? ? ? ?  01/04/2022  ?  1:34 PM 11/10/2020  ? 11:20 AM 04/07/2020  ? 10:11 AM 12/18/2019  ?  9:27 AM 08/07/2019  ?  9:19 AM 02/05/2019  ? 10:02 AM 04/24/2018  ?  8:54 AM  ?PHQ 2/9 Scores  ?PHQ - 2 Score 0 0 0 0 0 0 0  ?PHQ- 9 Score   0 0 0 0   ?  ?Fall Risk ? ?  01/04/2022  ?  1:55 PM 11/10/2020  ? 11:20 AM 04/07/2020  ? 10:11 AM 12/18/2019  ?  9:26 AM 08/07/2019  ?  9:19 AM  ?Fall Risk   ?Falls in the past year? 0 0 0 0 0  ?Number falls in past yr: 0 0 0 0 0  ?Injury  with Fall? 0 0 0 0 0  ?Risk for fall due to : No Fall Risks      ?Follow up Falls evaluation completed  Falls evaluation completed    ? ? ?FALL RISK PREVENTION PERTAINING TO THE HOME: ? ?Any stairs in or around

## 2022-01-15 ENCOUNTER — Encounter (INDEPENDENT_AMBULATORY_CARE_PROVIDER_SITE_OTHER): Payer: Medicare Other | Admitting: Ophthalmology

## 2022-01-22 ENCOUNTER — Encounter (INDEPENDENT_AMBULATORY_CARE_PROVIDER_SITE_OTHER): Payer: Medicare Other | Admitting: Ophthalmology

## 2022-01-22 DIAGNOSIS — H35033 Hypertensive retinopathy, bilateral: Secondary | ICD-10-CM | POA: Diagnosis not present

## 2022-01-22 DIAGNOSIS — D3131 Benign neoplasm of right choroid: Secondary | ICD-10-CM | POA: Diagnosis not present

## 2022-01-22 DIAGNOSIS — I10 Essential (primary) hypertension: Secondary | ICD-10-CM

## 2022-01-22 DIAGNOSIS — H353231 Exudative age-related macular degeneration, bilateral, with active choroidal neovascularization: Secondary | ICD-10-CM

## 2022-01-22 DIAGNOSIS — H43813 Vitreous degeneration, bilateral: Secondary | ICD-10-CM

## 2022-02-15 ENCOUNTER — Other Ambulatory Visit: Payer: Self-pay | Admitting: Internal Medicine

## 2022-02-15 DIAGNOSIS — I701 Atherosclerosis of renal artery: Secondary | ICD-10-CM

## 2022-02-16 NOTE — Telephone Encounter (Signed)
Requested medication (s) are due for refill today: expired medication, see previous request   Requested medication (s) are on the active medication list: yes  Last refill:  11/10/20 #90 3 refills  Future visit scheduled: yes in 1 month   Notes to clinic:  expired medication, took last dose today . Last labs 04/24/18 do you want to renew Rx?     Requested Prescriptions  Pending Prescriptions Disp Refills   clopidogrel (PLAVIX) 75 MG tablet [Pharmacy Med Name: CLOPIDOGREL 75 MG TABLET] 90 tablet 0    Sig: Take 1 tablet (75 mg total) by mouth daily.     Hematology: Antiplatelets - clopidogrel Failed - 02/16/2022  2:35 PM      Failed - HCT in normal range and within 180 days    HCT  Date Value Ref Range Status  04/24/2018 37.5 35.0 - 45.0 % Final   Hematocrit  Date Value Ref Range Status  09/25/2015 34.7 34.0 - 46.6 % Final         Failed - HGB in normal range and within 180 days    Hemoglobin  Date Value Ref Range Status  04/24/2018 12.3 11.7 - 15.5 g/dL Final  09/25/2015 11.1 11.1 - 15.9 g/dL Final         Failed - PLT in normal range and within 180 days    Platelets  Date Value Ref Range Status  04/24/2018 194 140 - 400 Thousand/uL Final  09/25/2015 258 150 - 379 x10E3/uL Final         Failed - Cr in normal range and within 360 days    Creat  Date Value Ref Range Status  08/20/2021 1.00 (H) 0.60 - 0.95 mg/dL Final   Creatinine, Urine  Date Value Ref Range Status  06/18/2016 65 20 - 320 mg/dL Final         Passed - Valid encounter within last 6 months    Recent Outpatient Visits           6 months ago Stage 3a chronic kidney disease ([place])   [facility], Coralie Keens, NP   1 year ago Prediabetes   [facility] Myles Gip, DO   1 year ago Essential hypertension   [facility] Delsa Grana, PA-C   2 years ago Essential hypertension   [facility] Delsa Grana, PA-C   2 years ago  Essential hypertension   [facility] Delsa Grana, PA-C       Future Appointments             In 1 month Baity, Coralie Keens, NP [facility], [facility]

## 2022-02-16 NOTE — Telephone Encounter (Signed)
Requested medication (s) are due for refill today: expired medication  Requested medication (s) are on the active medication list: yes  Last refill:  11/10/20 #90 3 refills  Future visit scheduled: yes in 1 month   Notes to clinic:  expired medication. Last labs 04/24/18 do you want to give courtesy refill/ renew Rx?     Requested Prescriptions  Pending Prescriptions Disp Refills   clopidogrel (PLAVIX) 75 MG tablet [Pharmacy Med Name: CLOPIDOGREL 75 MG TABLET] 90 tablet 0    Sig: Take 1 tablet (75 mg total) by mouth daily.     Hematology: Antiplatelets - clopidogrel Failed - 02/15/2022 12:46 PM      Failed - HCT in normal range and within 180 days    HCT  Date Value Ref Range Status  04/24/2018 37.5 35.0 - 45.0 % Final   Hematocrit  Date Value Ref Range Status  09/25/2015 34.7 34.0 - 46.6 % Final         Failed - HGB in normal range and within 180 days    Hemoglobin  Date Value Ref Range Status  04/24/2018 12.3 11.7 - 15.5 g/dL Final  09/25/2015 11.1 11.1 - 15.9 g/dL Final         Failed - PLT in normal range and within 180 days    Platelets  Date Value Ref Range Status  04/24/2018 194 140 - 400 Thousand/uL Final  09/25/2015 258 150 - 379 x10E3/uL Final         Failed - Cr in normal range and within 360 days    Creat  Date Value Ref Range Status  08/20/2021 1.00 (H) 0.60 - 0.95 mg/dL Final   Creatinine, Urine  Date Value Ref Range Status  06/18/2016 65 20 - 320 mg/dL Final         Passed - Valid encounter within last 6 months    Recent Outpatient Visits           6 months ago Stage 3a chronic kidney disease ([place])   [facility], Coralie Keens, NP   1 year ago Prediabetes   [facility] Myles Gip, DO   1 year ago Essential hypertension   [facility] Delsa Grana, PA-C   2 years ago Essential hypertension   [facility] Delsa Grana, PA-C   2 years ago Essential hypertension    [facility] Delsa Grana, PA-C       Future Appointments             In 1 month Baity, Coralie Keens, NP [facility], [facility]

## 2022-02-16 NOTE — Telephone Encounter (Signed)
Pt called for status update on request, says she took her last one today.

## 2022-02-17 ENCOUNTER — Other Ambulatory Visit: Payer: Self-pay

## 2022-02-17 DIAGNOSIS — I701 Atherosclerosis of renal artery: Secondary | ICD-10-CM

## 2022-02-17 MED ORDER — CLOPIDOGREL BISULFATE 75 MG PO TABS: 75.0000 mg | ORAL_TABLET | Freq: Every day | ORAL | 1 refills | Status: DC

## 2022-02-18 ENCOUNTER — Ambulatory Visit: Payer: Medicare Other | Admitting: Dermatology

## 2022-02-23 ENCOUNTER — Ambulatory Visit: Payer: Medicare Other | Admitting: Internal Medicine

## 2022-04-12 ENCOUNTER — Encounter: Payer: Self-pay | Admitting: Internal Medicine

## 2022-04-12 ENCOUNTER — Ambulatory Visit (INDEPENDENT_AMBULATORY_CARE_PROVIDER_SITE_OTHER): Payer: Medicare Other | Admitting: Internal Medicine

## 2022-04-12 VITALS — BP 178/76 | HR 97 | Temp 97.5°F | Wt 151.0 lb

## 2022-04-12 DIAGNOSIS — E782 Mixed hyperlipidemia: Secondary | ICD-10-CM | POA: Diagnosis not present

## 2022-04-12 DIAGNOSIS — E663 Overweight: Secondary | ICD-10-CM

## 2022-04-12 DIAGNOSIS — N1831 Chronic kidney disease, stage 3a: Secondary | ICD-10-CM | POA: Diagnosis not present

## 2022-04-12 DIAGNOSIS — I700 Atherosclerosis of aorta: Secondary | ICD-10-CM

## 2022-04-12 DIAGNOSIS — I739 Peripheral vascular disease, unspecified: Secondary | ICD-10-CM | POA: Diagnosis not present

## 2022-04-12 DIAGNOSIS — I2510 Atherosclerotic heart disease of native coronary artery without angina pectoris: Secondary | ICD-10-CM | POA: Diagnosis not present

## 2022-04-12 DIAGNOSIS — R7303 Prediabetes: Secondary | ICD-10-CM

## 2022-04-12 DIAGNOSIS — Z6829 Body mass index (BMI) 29.0-29.9, adult: Secondary | ICD-10-CM | POA: Diagnosis not present

## 2022-04-12 DIAGNOSIS — I701 Atherosclerosis of renal artery: Secondary | ICD-10-CM

## 2022-04-12 DIAGNOSIS — I10 Essential (primary) hypertension: Secondary | ICD-10-CM | POA: Diagnosis not present

## 2022-04-12 DIAGNOSIS — M170 Bilateral primary osteoarthritis of knee: Secondary | ICD-10-CM

## 2022-04-12 MED ORDER — CLOPIDOGREL BISULFATE 75 MG PO TABS: 75.0000 mg | ORAL_TABLET | Freq: Every day | ORAL | 1 refills | Status: DC

## 2022-04-12 MED ORDER — ROSUVASTATIN CALCIUM 20 MG PO TABS: 20.0000 mg | ORAL_TABLET | Freq: Every day | ORAL | 1 refills | Status: DC

## 2022-04-12 MED ORDER — AMLODIPINE BESYLATE 5 MG PO TABS: 5.0000 mg | ORAL_TABLET | Freq: Every day | ORAL | 0 refills | Status: DC

## 2022-04-12 NOTE — Progress Notes (Signed)
Subjective:    Patient ID: Angela Duffy, female    DOB: 02-May-1935, 87 y.o.   MRN: [id number]  HPI  Patient presents to clinic today for follow-up of chronic conditions.  CKD 3: Her last creatinine was 1.0, GFR 56, 08/2021.  She is not currently on an ACEI/ARB.  She does not follow with nephrology.  HLD with CAD, PVD and Aortic Atherosclerosis: Her last LDL was 111, triglycerides 180, 08/2021.  She denies myalgias on Rosuvastatin.  She is taking Plavix as well.  She does not consume a low-fat diet.  HTN: Her BP today is 174/78.  She is taking Triamterene HCT as prescribed.  ECG from 02/2021 reviewed.  Prediabetes: Her last A1c was 6%, 08/2021.  She is not taking any oral diabetic medication at this time.  She does not check her sugars.  OA: Mainly in her knees.  She takes Naproxen as needed with Duffy relief of symptoms.  Review of Systems     Past Medical History:  Diagnosis Date   Atherosclerosis    on Chest CT 08/2014; 70-80% innominate artery, right subclavian artery, 75% celiac artery, 3 vessel coronary artery involvement   CKD (chronic kidney disease) stage 3, GFR 30-59 ml/min (HCC)    Coronary atherosclerosis    History of tobacco use    Hyperlipidemia    Hypertension    IFG (impaired fasting glucose)    Left ventricular diastolic dysfunction    Left ventricular hypertrophy    Mitral valve regurgitation    Obesity    PVD (peripheral vascular disease) (HCC)    Renal artery atherosclerosis (HCC)    greater than 60% stenosis right renal artery   Tricuspid regurgitation    Vitamin B12 deficiency    Vitamin D deficiency disease     Current Outpatient Medications  Medication Sig Dispense Refill   Bevacizumab (AVASTIN IV) Inject 1.25 mg into the eye every 6 (six) weeks. evry 12 weeks     clopidogrel (PLAVIX) 75 MG tablet Take 1 tablet (75 mg total) by mouth daily. 90 tablet 1   Multiple Vitamins-Minerals (PRESERVISION AREDS 2 PO) Take by mouth.     rosuvastatin  (CRESTOR) 20 MG tablet Take 1 tablet (20 mg total) by mouth daily. 90 tablet 3   triamterene-hydrochlorothiazide (MAXZIDE-25) 37.5-25 MG tablet Take 1 tablet by mouth daily. 90 tablet 0   No current facility-administered medications for this visit.    Allergies  Allergen Reactions   Cozaar [Losartan] Other (See Comments)    cramps   Elemental Sulfur Diarrhea and Nausea And Vomiting   Felodipine Swelling   Lisinopril Other (See Comments)   Metoprolol Swelling   Neomycin Rash   Nickel Rash   Penicillins Rash    Family History  Problem Relation Age of Onset   Cancer Mother        colon   Emphysema Father    Heart disease Father    Heart attack Father    Cancer Sister 32       breast   Lupus Sister    Heart disease Sister    Multiple sclerosis Brother    Hypertension Sister    Cancer Daughter        breast   Cancer Paternal Grandmother        female    Social History   Socioeconomic History   Marital status: Divorced    Spouse name: Not on file   Number of children: 3   Years of education: Not on  file   Highest education level: 11th grade  Occupational History    Employer: RETIRED  Tobacco Use   Smoking status: Former    Packs/day: 2.00    Years: 64.00    Total pack years: 128.00    Types: Cigarettes    Quit date: 04/14/2014    Years since quitting: 8.0   Smokeless tobacco: Never   Tobacco comments:    smoking cessation materials not required  Vaping Use   Vaping Use: Never used  Substance and Sexual Activity   Alcohol use: Not Currently   Drug use: No   Sexual activity: Not Currently  Other Topics Concern   Not on file  Social History Narrative   Not on file   Social Determinants of Health   Financial Resource Strain: Low Risk  (01/04/2022)   Overall Financial Resource Strain (CARDIA)    Difficulty of Paying Living Expenses: Not hard at all  Food Insecurity: No Food Insecurity (01/04/2022)   Hunger Vital Sign    Worried About Running Out of Food in  the Last Year: Never true    Ran Out of Food in the Last Year: Never true  Transportation Needs: No Transportation Needs (01/04/2022)   PRAPARE - Hydrologist (Medical): No    Lack of Transportation (Non-Medical): No  Physical Activity: Sufficiently Active (01/04/2022)   Exercise Vital Sign    Days of Exercise per Week: 7 days    Minutes of Exercise per Session: 30 min  Stress: No Stress Concern Present (01/04/2022)   [place]    Feeling of Stress : Not at all  Social Connections: Socially Isolated (01/04/2022)   Social Connection and Isolation Panel [NHANES]    Frequency of Communication with Friends and Family: More than three times a week    Frequency of Social Gatherings with Friends and Family: Once a week    Attends Religious Services: Never    Marine scientist or Organizations: No    Attends Archivist Meetings: Never    Marital Status: Divorced  Human resources officer Violence: Not At Risk (04/20/2018)   Humiliation, Afraid, Rape, and Kick questionnaire    Fear of Current or Ex-Partner: No    Emotionally Abused: No    Physically Abused: No    Sexually Abused: No     Constitutional: Denies fever, malaise, fatigue, headache or abrupt weight changes.  HEENT: Denies eye pain, eye redness, ear pain, ringing in the ears, wax buildup, runny nose, nasal congestion, bloody nose, or sore throat. Respiratory: Denies difficulty breathing, shortness of breath, cough or sputum production.   Cardiovascular: Denies chest pain, chest tightness, palpitations or swelling in the hands or feet.  Gastrointestinal: Denies abdominal pain, bloating, constipation, diarrhea or blood in the stool.  GU: Denies urgency, frequency, pain with urination, burning sensation, blood in urine, odor or discharge. Musculoskeletal: Patient reports knee pain.  Denies decrease in range of motion, difficulty with gait,  muscle pain or joint swelling.  Skin: Denies redness, rashes, lesions or ulcercations.  Neurological: Denies dizziness, difficulty with memory, difficulty with speech or problems with balance and coordination.  Psych: Denies anxiety, depression, SI/HI.  No other specific complaints in a complete review of systems (except as listed in HPI above).  Objective:   Physical Exam  BP (!) 178/76 (BP Location: Right Arm, Patient Position: Sitting, Cuff Size: Normal)   Pulse 97   Temp (!) 97.5 F (36.4 C) (  Temporal)   Wt 151 lb (68.5 kg)   SpO2 99%   BMI 29.49 kg/m   Wt Readings from Last 3 Encounters:  08/20/21 151 lb 12.8 oz (68.9 kg)  02/12/21 156 lb (70.8 kg)  11/10/20 151 lb 6.4 oz (68.7 kg)    General: Appears her stated age, overweight, in NAD. Skin: Warm, dry and intact.  HEENT: Head: normal shape and size; Eyes: sclera white, no icterus, conjunctiva pink, PERRLA and EOMs intact;  Cardiovascular: Normal rate and rhythm. S1,S2 noted.  No murmur, rubs or gallops noted. No JVD or BLE edema. No carotid bruits noted. Pulmonary/Chest: Normal effort and positive vesicular breath sounds. No respiratory distress. No wheezes, rales or ronchi noted.  Musculoskeletal: No difficulty with gait.  Neurological: Alert and oriented.    BMET    Component Value Date/Time   NA 143 08/20/2021 1058   NA 142 09/25/2015 1135   K 4.3 08/20/2021 1058   CL 102 08/20/2021 1058   CO2 32 08/20/2021 1058   GLUCOSE 104 (H) 08/20/2021 1058   BUN 11 08/20/2021 1058   BUN 12 09/25/2015 1135   BUN 18 12/17/2014 1418   CREATININE 1.00 (H) 08/20/2021 1058   CALCIUM 9.6 08/20/2021 1058   GFRNONAA >60 02/12/2021 1125   GFRNONAA 59 (L) 12/04/2019 0933   GFRAA 68 12/04/2019 0933    Lipid Panel     Component Value Date/Time   CHOL 194 08/20/2021 1058   CHOL 218 (H) 09/25/2015 1135   TRIG 180 (H) 08/20/2021 1058   HDL 53 08/20/2021 1058   HDL 50 09/25/2015 1135   CHOLHDL 3.7 08/20/2021 1058   VLDL 22  04/18/2017 0909   LDLCALC 111 (H) 08/20/2021 1058    CBC    Component Value Date/Time   WBC 5.3 04/24/2018 0924   RBC 4.46 04/24/2018 0924   HGB 12.3 04/24/2018 0924   HGB 11.1 09/25/2015 1135   HCT 37.5 04/24/2018 0924   HCT 34.7 09/25/2015 1135   PLT 194 04/24/2018 0924   PLT 258 09/25/2015 1135   MCV 84.1 04/24/2018 0924   MCV 82 09/25/2015 1135   MCH 27.6 04/24/2018 0924   MCHC 32.8 04/24/2018 0924   RDW 14.3 04/24/2018 0924   RDW 16.8 (H) 09/25/2015 1135   LYMPHSABS 583 (L) 04/24/2018 0924   LYMPHSABS 0.8 09/25/2015 1135   MONOABS 468 12/28/2016 1043   EOSABS 21 04/24/2018 0924   EOSABS 0.1 09/25/2015 1135   BASOSABS 32 04/24/2018 0924   BASOSABS 0.0 09/25/2015 1135    Hgb A1C Lab Results  Component Value Date   HGBA1C 6.0 (H) 08/20/2021           Assessment & Plan:     RTC in 6 months, follow-up chronic conditions Webb Silversmith, NP

## 2022-04-12 NOTE — Assessment & Plan Note (Signed)
Encourage diet and exercise for weight loss 

## 2022-04-12 NOTE — Assessment & Plan Note (Signed)
Continue rosuvastatin and Plavix Encouraged her to consume low-fat diet C-Met and lipid profile today

## 2022-04-12 NOTE — Patient Instructions (Signed)

## 2022-04-12 NOTE — Assessment & Plan Note (Signed)
A1c today Encourage low-carb diet

## 2022-04-12 NOTE — Assessment & Plan Note (Signed)
Encourage regular physical activity Advised her to use Tylenol and not naproxen due to her chronic kidney disease

## 2022-04-12 NOTE — Assessment & Plan Note (Signed)
C-Met today 

## 2022-04-12 NOTE — Assessment & Plan Note (Signed)
Uncontrolled, will D/C triamterene HCT Rx for amlodipine 5 mg daily Reinforced DASH diet C-Met today

## 2022-04-13 LAB — COMPLETE METABOLIC PANEL WITH GFR
AG Ratio: 1.7 (calc) (ref 1.0–2.5)
ALT: 16 U/L (ref 6–29)
AST: 22 U/L (ref 10–35)
Albumin: 4.2 g/dL (ref 3.6–5.1)
Alkaline phosphatase (APISO): 65 U/L (ref 37–153)
BUN/Creatinine Ratio: 15 (calc) (ref 6–22)
BUN: 16 mg/dL (ref 7–25)
CO2: 30 mmol/L (ref 20–32)
Calcium: 9.4 mg/dL (ref 8.6–10.4)
Chloride: 104 mmol/L (ref 98–110)
Creat: 1.06 mg/dL — ABNORMAL HIGH (ref 0.60–0.95)
Globulin: 2.5 g/dL (calc) (ref 1.9–3.7)
Glucose, Bld: 103 mg/dL (ref 65–139)
Potassium: 3.9 mmol/L (ref 3.5–5.3)
Sodium: 141 mmol/L (ref 135–146)
Total Bilirubin: 0.6 mg/dL (ref 0.2–1.2)
Total Protein: 6.7 g/dL (ref 6.1–8.1)
eGFR: 52 mL/min/{1.73_m2} — ABNORMAL LOW (ref >=60)

## 2022-04-13 LAB — LIPID PANEL
Cholesterol: 207 mg/dL — ABNORMAL HIGH (ref <200)
HDL: 49 mg/dL — ABNORMAL LOW (ref >=50)
LDL Cholesterol (Calc): 128 mg/dL (calc) — ABNORMAL HIGH
Non-HDL Cholesterol (Calc): 158 mg/dL (calc) — ABNORMAL HIGH (ref <130)
Total CHOL/HDL Ratio: 4.2 (calc) (ref <5.0)
Triglycerides: 188 mg/dL — ABNORMAL HIGH (ref <150)

## 2022-04-13 LAB — HEMOGLOBIN A1C
Hgb A1c MFr Bld: 5.6 % of total Hgb (ref <5.7)
Mean Plasma Glucose: 114 mg/dL
eAG (mmol/L): 6.3 mmol/L

## 2022-05-03 DIAGNOSIS — H40013 Open angle with borderline findings, low risk, bilateral: Secondary | ICD-10-CM | POA: Diagnosis not present

## 2022-05-03 DIAGNOSIS — H353132 Nonexudative age-related macular degeneration, bilateral, intermediate dry stage: Secondary | ICD-10-CM | POA: Diagnosis not present

## 2022-05-04 ENCOUNTER — Encounter: Payer: Self-pay | Admitting: Internal Medicine

## 2022-05-04 ENCOUNTER — Ambulatory Visit (INDEPENDENT_AMBULATORY_CARE_PROVIDER_SITE_OTHER): Payer: Medicare Other | Admitting: Internal Medicine

## 2022-05-04 DIAGNOSIS — E6609 Other obesity due to excess calories: Secondary | ICD-10-CM | POA: Diagnosis not present

## 2022-05-04 DIAGNOSIS — Z6830 Body mass index (BMI) 30.0-30.9, adult: Secondary | ICD-10-CM

## 2022-05-04 DIAGNOSIS — I701 Atherosclerosis of renal artery: Secondary | ICD-10-CM | POA: Diagnosis not present

## 2022-05-04 DIAGNOSIS — I10 Essential (primary) hypertension: Secondary | ICD-10-CM | POA: Diagnosis not present

## 2022-05-04 MED ORDER — AMLODIPINE-OLMESARTAN 10-20 MG PO TABS: 1.0000 | ORAL_TABLET | Freq: Every day | ORAL | 0 refills | Status: DC

## 2022-05-04 NOTE — Progress Notes (Signed)
Subjective:    Patient ID: Angela Duffy, female    DOB: Sep 27, 1934, 87 y.o.   MRN: [id number]  HPI  Patient presents to clinic today for 2-week follow-up of HTN.  At her last visit, her Triamterene HCTZ was D/C'd and she was started on Amlodipine 5 mg daily.  She has been taking the medication as prescribed.  Her BP today is 158/80.  ECG from 02/2021 reviewed.  Review of Systems     Past Medical History:  Diagnosis Date   Atherosclerosis    on Chest CT 08/2014; 70-80% innominate artery, right subclavian artery, 75% celiac artery, 3 vessel coronary artery involvement   CKD (chronic kidney disease) stage 3, GFR 30-59 ml/min (HCC)    Coronary atherosclerosis    History of tobacco use    Hyperlipidemia    Hypertension    IFG (impaired fasting glucose)    Left ventricular diastolic dysfunction    Left ventricular hypertrophy    Mitral valve regurgitation    Obesity    PVD (peripheral vascular disease) (HCC)    Renal artery atherosclerosis (HCC)    greater than 60% stenosis right renal artery   Tricuspid regurgitation    Vitamin B12 deficiency    Vitamin D deficiency disease     Current Outpatient Medications  Medication Sig Dispense Refill   amLODipine (NORVASC) 5 MG tablet Take 1 tablet (5 mg total) by mouth daily. 30 tablet 0   Bevacizumab (AVASTIN IV) Inject 1.25 mg into the eye every 6 (six) weeks. evry 12 weeks     clopidogrel (PLAVIX) 75 MG tablet Take 1 tablet (75 mg total) by mouth daily. 90 tablet 1   Multiple Vitamins-Minerals (PRESERVISION AREDS 2 PO) Take by mouth.     rosuvastatin (CRESTOR) 20 MG tablet Take 1 tablet (20 mg total) by mouth daily. 90 tablet 1   No current facility-administered medications for this visit.    Allergies  Allergen Reactions   Cozaar [Losartan] Other (See Comments)    cramps   Elemental Sulfur Diarrhea and Nausea And Vomiting   Felodipine Swelling   Lisinopril Other (See Comments)   Metoprolol Swelling   Neomycin Rash    Nickel Rash   Penicillins Rash    Family History  Problem Relation Age of Onset   Cancer Mother        colon   Emphysema Father    Heart disease Father    Heart attack Father    Cancer Sister 64       breast   Lupus Sister    Heart disease Sister    Multiple sclerosis Brother    Hypertension Sister    Cancer Daughter        breast   Cancer Paternal Grandmother        female    Social History   Socioeconomic History   Marital status: Divorced    Spouse name: Not on file   Number of children: 3   Years of education: Not on file   Highest education level: 11th grade  Occupational History    Employer: RETIRED  Tobacco Use   Smoking status: Former    Packs/day: 2.00    Years: 64.00    Total pack years: 128.00    Types: Cigarettes    Quit date: 04/14/2014    Years since quitting: 8.0   Smokeless tobacco: Never   Tobacco comments:    smoking cessation materials not required  Vaping Use   Vaping Use: Never used  Substance and Sexual Activity   Alcohol use: Not Currently   Drug use: No   Sexual activity: Not Currently  Other Topics Concern   Not on file  Social History Narrative   Not on file   Social Determinants of Health   Financial Resource Strain: Low Risk  (01/04/2022)   Overall Financial Resource Strain (CARDIA)    Difficulty of Paying Living Expenses: Not hard at all  Food Insecurity: No Food Insecurity (01/04/2022)   Hunger Vital Sign    Worried About Running Out of Food in the Last Year: Never true    Ran Out of Food in the Last Year: Never true  Transportation Needs: No Transportation Needs (01/04/2022)   PRAPARE - Hydrologist (Medical): No    Lack of Transportation (Non-Medical): No  Physical Activity: Sufficiently Active (01/04/2022)   Exercise Vital Sign    Days of Exercise per Week: 7 days    Minutes of Exercise per Session: 30 min  Stress: No Stress Concern Present (01/04/2022)   [place]    Feeling of Stress : Not at all  Social Connections: Socially Isolated (01/04/2022)   Social Connection and Isolation Panel [NHANES]    Frequency of Communication with Friends and Family: More than three times a week    Frequency of Social Gatherings with Friends and Family: Once a week    Attends Religious Services: Never    Marine scientist or Organizations: No    Attends Archivist Meetings: Never    Marital Status: Divorced  Human resources officer Violence: Not At Risk (04/20/2018)   Humiliation, Afraid, Rape, and Kick questionnaire    Fear of Current or Ex-Partner: No    Emotionally Abused: No    Physically Abused: No    Sexually Abused: No     Constitutional: Denies fever, malaise, fatigue, headache or abrupt weight changes.  Respiratory: Denies difficulty breathing, shortness of breath, cough or sputum production.   Cardiovascular: Denies chest pain, chest tightness, palpitations or swelling in the hands or feet.  Neurological: Denies dizziness, difficulty with memory, difficulty with speech or problems with balance and coordination.    No other specific complaints in a complete review of systems (except as listed in HPI above).  Objective:   Physical Exam  BP (!) 167/70 (BP Location: Right Arm, Patient Position: Sitting, Cuff Size: Normal)   Pulse 90   Temp (!) 97.3 F (36.3 C) (Temporal)   Wt 155 lb (70.3 kg)   SpO2 99%   BMI 30.27 kg/m   Wt Readings from Last 3 Encounters:  04/12/22 151 lb (68.5 kg)  08/20/21 151 lb 12.8 oz (68.9 kg)  02/12/21 156 lb (70.8 kg)    General: Appears her stated age, obese, in NAD. Cardiovascular: Normal rate and rhythm. S1,S2 noted.  No murmur, rubs or gallops noted.  Trace pitting BLE edema.  Pulmonary/Chest: Normal effort and positive vesicular breath sounds. No respiratory distress. No wheezes, rales or ronchi noted.  Musculoskeletal: No difficulty with gait.  Neurological: Alert  and oriented. Coordination normal.   BMET    Component Value Date/Time   NA 141 04/12/2022 1116   NA 142 09/25/2015 1135   K 3.9 04/12/2022 1116   CL 104 04/12/2022 1116   CO2 30 04/12/2022 1116   GLUCOSE 103 04/12/2022 1116   BUN 16 04/12/2022 1116   BUN 12 09/25/2015 1135   BUN 18 12/17/2014 1418  CREATININE 1.06 (H) 04/12/2022 1116   CALCIUM 9.4 04/12/2022 1116   GFRNONAA >60 02/12/2021 1125   GFRNONAA 59 (L) 12/04/2019 0933   GFRAA 68 12/04/2019 0933    Lipid Panel     Component Value Date/Time   CHOL 207 (H) 04/12/2022 1116   CHOL 218 (H) 09/25/2015 1135   TRIG 188 (H) 04/12/2022 1116   HDL 49 (L) 04/12/2022 1116   HDL 50 09/25/2015 1135   CHOLHDL 4.2 04/12/2022 1116   VLDL 22 04/18/2017 0909   LDLCALC 128 (H) 04/12/2022 1116    CBC    Component Value Date/Time   WBC 5.3 04/24/2018 0924   RBC 4.46 04/24/2018 0924   HGB 12.3 04/24/2018 0924   HGB 11.1 09/25/2015 1135   HCT 37.5 04/24/2018 0924   HCT 34.7 09/25/2015 1135   PLT 194 04/24/2018 0924   PLT 258 09/25/2015 1135   MCV 84.1 04/24/2018 0924   MCV 82 09/25/2015 1135   MCH 27.6 04/24/2018 0924   MCHC 32.8 04/24/2018 0924   RDW 14.3 04/24/2018 0924   RDW 16.8 (H) 09/25/2015 1135   LYMPHSABS 583 (L) 04/24/2018 0924   LYMPHSABS 0.8 09/25/2015 1135   MONOABS 468 12/28/2016 1043   EOSABS 21 04/24/2018 0924   EOSABS 0.1 09/25/2015 1135   BASOSABS 32 04/24/2018 0924   BASOSABS 0.0 09/25/2015 1135    Hgb A1C Lab Results  Component Value Date   HGBA1C 5.6 04/12/2022            Assessment & Plan:     RTC in 2 weeks follow up HTN, 6 months, follow-up chronic conditions Webb Silversmith, NP

## 2022-05-04 NOTE — Patient Instructions (Signed)

## 2022-05-04 NOTE — Assessment & Plan Note (Signed)
Remains uncontrolled We will D/C Amlodipine and switch to Amlodipine-Olmesartan 10-20 mg daily Reinforced DASH diet and exercise for weight loss

## 2022-05-04 NOTE — Assessment & Plan Note (Signed)
Encourage diet and exercise for weight loss 

## 2022-05-20 ENCOUNTER — Other Ambulatory Visit: Payer: Self-pay | Admitting: Internal Medicine

## 2022-05-20 DIAGNOSIS — I701 Atherosclerosis of renal artery: Secondary | ICD-10-CM

## 2022-05-20 NOTE — Telephone Encounter (Signed)
Medication Refill - Medication: amlodipine-olmesartan (AZOR) 10-20 MG tablet  clopidogrel (PLAVIX) 75 MG tablet  Has the patient contacted their pharmacy? Yes.   (Agent: If no, request that the patient contact the pharmacy for the refill. If patient does not wish to contact the pharmacy document the reason why and proceed with request.) (Agent: If yes, when and what did the pharmacy advise?)  Preferred Pharmacy (with phone number or street name):  [place], [address]  [place] [address]  Phone: [phone] Fax: [phone]   Has the patient been seen for an appointment in the last year OR does the patient have an upcoming appointment? Yes.    Agent: Please be advised that RX refills may take up to 3 business days. We ask that you follow-up with your pharmacy.

## 2022-05-21 ENCOUNTER — Encounter (INDEPENDENT_AMBULATORY_CARE_PROVIDER_SITE_OTHER): Payer: Medicare Other | Admitting: Ophthalmology

## 2022-05-21 DIAGNOSIS — H353231 Exudative age-related macular degeneration, bilateral, with active choroidal neovascularization: Secondary | ICD-10-CM | POA: Diagnosis not present

## 2022-05-21 DIAGNOSIS — H35033 Hypertensive retinopathy, bilateral: Secondary | ICD-10-CM

## 2022-05-21 DIAGNOSIS — H43813 Vitreous degeneration, bilateral: Secondary | ICD-10-CM | POA: Diagnosis not present

## 2022-05-21 DIAGNOSIS — I10 Essential (primary) hypertension: Secondary | ICD-10-CM

## 2022-05-21 DIAGNOSIS — D3131 Benign neoplasm of right choroid: Secondary | ICD-10-CM | POA: Diagnosis not present

## 2022-05-21 MED ORDER — AMLODIPINE-OLMESARTAN 10-20 MG PO TABS: 1.0000 | ORAL_TABLET | Freq: Every day | ORAL | 0 refills | Status: DC

## 2022-05-21 NOTE — Telephone Encounter (Signed)
Unable to refill per protocol, request is too soon. Last refill was 04/12/22 for 90 and 1 RF. Receipt confirmed by pharmacy (04/12/2022 11:14 a.m.). Will refuse this request.  Requested Prescriptions  Pending Prescriptions Disp Refills  . clopidogrel (PLAVIX) 75 MG tablet 90 tablet 1    Sig: Take 1 tablet (75 mg total) by mouth daily.     Hematology: Antiplatelets - clopidogrel Failed - 05/20/2022 11:36 AM      Failed - HCT in normal range and within 180 days    HCT  Date Value Ref Range Status  04/24/2018 37.5 35.0 - 45.0 % Final   Hematocrit  Date Value Ref Range Status  09/25/2015 34.7 34.0 - 46.6 % Final         Failed - HGB in normal range and within 180 days    Hemoglobin  Date Value Ref Range Status  04/24/2018 12.3 11.7 - 15.5 g/dL Final  09/25/2015 11.1 11.1 - 15.9 g/dL Final         Failed - PLT in normal range and within 180 days    Platelets  Date Value Ref Range Status  04/24/2018 194 140 - 400 Thousand/uL Final  09/25/2015 258 150 - 379 x10E3/uL Final         Failed - Cr in normal range and within 360 days    Creat  Date Value Ref Range Status  04/12/2022 1.06 (H) 0.60 - 0.95 mg/dL Final   Creatinine, Urine  Date Value Ref Range Status  06/18/2016 65 20 - 320 mg/dL Final         Passed - Valid encounter within last 6 months    Recent Outpatient Visits          2 weeks ago Benign essential hypertension   [place], Coralie Keens, NP   1 month ago Prediabetes   [facility] [place], Coralie Keens, NP   9 months ago Stage 3a chronic kidney disease [facility])   [facility] [place], Coralie Keens, NP   1 year ago Prediabetes   [facility] Myles Gip, DO   2 years ago Essential hypertension   [place], Kristeen Miss, [place]             Signed Prescriptions Disp Refills   amlodipine-olmesartan (AZOR) 10-20 MG tablet 90 tablet 0    Sig: Take 1 tablet by mouth daily.      Cardiovascular: CCB + ARB Combos Failed - 05/20/2022 11:36 AM      Failed - Cr in normal range and within 180 days    Creat  Date Value Ref Range Status  04/12/2022 1.06 (H) 0.60 - 0.95 mg/dL Final   Creatinine, Urine  Date Value Ref Range Status  06/18/2016 65 20 - 320 mg/dL Final         Failed - Last BP in normal range    BP Readings from Last 1 Encounters:  05/04/22 (!) 167/70         Passed - K in normal range and within 180 days    Potassium  Date Value Ref Range Status  04/12/2022 3.9 3.5 - 5.3 mmol/L Final         Passed - Na in normal range and within 180 days    Sodium  Date Value Ref Range Status  04/12/2022 141 135 - 146 mmol/L Final  09/25/2015 142 134 - 144 mmol/L Final  Passed - Patient is not pregnant      Passed - Valid encounter within last 6 months    Recent Outpatient Visits          2 weeks ago Benign essential hypertension   [place], NP   1 month ago Prediabetes   [facility] [place], Coralie Keens, NP   9 months ago Stage 3a chronic kidney disease [facility])   [facility], Coralie Keens, NP   1 year ago Prediabetes   [facility] Myles Gip, DO   2 years ago Essential hypertension   [facility] Delsa Grana, [place]

## 2022-05-21 NOTE — Telephone Encounter (Signed)
Requested Prescriptions  Pending Prescriptions Disp Refills  . amlodipine-olmesartan (AZOR) 10-20 MG tablet 30 tablet 0    Sig: Take 1 tablet by mouth daily.     Cardiovascular: CCB + ARB Combos Failed - 05/20/2022 11:36 AM      Failed - Cr in normal range and within 180 days    Creat  Date Value Ref Range Status  04/12/2022 1.06 (H) 0.60 - 0.95 mg/dL Final   Creatinine, Urine  Date Value Ref Range Status  06/18/2016 65 20 - 320 mg/dL Final         Failed - Last BP in normal range    BP Readings from Last 1 Encounters:  05/04/22 (!) 167/70         Passed - K in normal range and within 180 days    Potassium  Date Value Ref Range Status  04/12/2022 3.9 3.5 - 5.3 mmol/L Final         Passed - Na in normal range and within 180 days    Sodium  Date Value Ref Range Status  04/12/2022 141 135 - 146 mmol/L Final  09/25/2015 142 134 - 144 mmol/L Final         Passed - Patient is not pregnant      Passed - Valid encounter within last 6 months    Recent Outpatient Visits          2 weeks ago Benign essential hypertension   [place], Coralie Keens, NP   1 month ago Prediabetes   [facility] [place], Coralie Keens, NP   9 months ago Stage 3a chronic kidney disease [facility])   [facility] [place] [facility] [place], Coralie Keens, NP   1 year ago Prediabetes   [facility] Rory Percy M, DO   2 years ago Essential hypertension   [facility] [place], Kristeen Miss, PA-C             . clopidogrel (PLAVIX) 75 MG tablet 90 tablet 1    Sig: Take 1 tablet (75 mg total) by mouth daily.     Hematology: Antiplatelets - clopidogrel Failed - 05/20/2022 11:36 AM      Failed - HCT in normal range and within 180 days    HCT  Date Value Ref Range Status  04/24/2018 37.5 35.0 - 45.0 % Final   Hematocrit  Date Value Ref Range Status  09/25/2015 34.7 34.0 - 46.6 % Final         Failed - HGB in normal range and within 180 days     Hemoglobin  Date Value Ref Range Status  04/24/2018 12.3 11.7 - 15.5 g/dL Final  09/25/2015 11.1 11.1 - 15.9 g/dL Final         Failed - PLT in normal range and within 180 days    Platelets  Date Value Ref Range Status  04/24/2018 194 140 - 400 Thousand/uL Final  09/25/2015 258 150 - 379 x10E3/uL Final         Failed - Cr in normal range and within 360 days    Creat  Date Value Ref Range Status  04/12/2022 1.06 (H) 0.60 - 0.95 mg/dL Final   Creatinine, Urine  Date Value Ref Range Status  06/18/2016 65 20 - 320 mg/dL Final         Passed - Valid encounter within last 6 months    Recent Outpatient Visits  2 weeks ago Benign essential hypertension   [place], NP   1 month ago Prediabetes   [facility] [place], Coralie Keens, NP   9 months ago Stage 3a chronic kidney disease [facility])   [facility], Coralie Keens, NP   1 year ago Prediabetes   [facility] Myles Gip, DO   2 years ago Essential hypertension   [facility] Delsa Grana, [place]

## 2022-07-18 DIAGNOSIS — Z23 Encounter for immunization: Secondary | ICD-10-CM | POA: Diagnosis not present

## 2022-08-13 ENCOUNTER — Ambulatory Visit (INDEPENDENT_AMBULATORY_CARE_PROVIDER_SITE_OTHER): Payer: Medicare Other | Admitting: Internal Medicine

## 2022-08-13 ENCOUNTER — Encounter: Payer: Self-pay | Admitting: Internal Medicine

## 2022-08-13 VITALS — BP 234/76 | HR 72 | Temp 97.1°F | Wt 152.0 lb

## 2022-08-13 DIAGNOSIS — Z6829 Body mass index (BMI) 29.0-29.9, adult: Secondary | ICD-10-CM | POA: Diagnosis not present

## 2022-08-13 DIAGNOSIS — I739 Peripheral vascular disease, unspecified: Secondary | ICD-10-CM

## 2022-08-13 DIAGNOSIS — E663 Overweight: Secondary | ICD-10-CM | POA: Diagnosis not present

## 2022-08-13 DIAGNOSIS — I160 Hypertensive urgency: Secondary | ICD-10-CM

## 2022-08-13 DIAGNOSIS — I701 Atherosclerosis of renal artery: Secondary | ICD-10-CM

## 2022-08-13 MED ORDER — AMLODIPINE-OLMESARTAN 10-40 MG PO TABS: 1.0000 | ORAL_TABLET | Freq: Every day | ORAL | 0 refills | Status: DC

## 2022-08-13 NOTE — Progress Notes (Signed)
Subjective:    Patient ID: Angela Duffy, female    DOB: 1935-01-26, 87 y.o.   MRN: [id number]  HPI  Patient presents to clinic today with complaint of left leg swelling and tenderness.  She noticed this a few weeks ago. She reports the swelling in her left leg improves when she lays down but gets worse throughout the day. She is wearing compression stockings. She has a history of PVD.  Of note, her BP today is 230/72. She is not taking Amlodipine-Olmesartan as prescribed. She reports she has a history of stents in her kidneys. She reports she is allergic to this.   Review of Systems     Past Medical History:  Diagnosis Date   Atherosclerosis    on Chest CT 08/2014; 70-80% innominate artery, right subclavian artery, 75% celiac artery, 3 vessel coronary artery involvement   CKD (chronic kidney disease) stage 3, GFR 30-59 ml/min (HCC)    Coronary atherosclerosis    History of tobacco use    Hyperlipidemia    Hypertension    IFG (impaired fasting glucose)    Left ventricular diastolic dysfunction    Left ventricular hypertrophy    Mitral valve regurgitation    Obesity    PVD (peripheral vascular disease) (HCC)    Renal artery atherosclerosis (HCC)    greater than 60% stenosis right renal artery   Tricuspid regurgitation    Vitamin B12 deficiency    Vitamin D deficiency disease     Current Outpatient Medications  Medication Sig Dispense Refill   amlodipine-olmesartan (AZOR) 10-20 MG tablet Take 1 tablet by mouth daily. 90 tablet 0   Bevacizumab (AVASTIN IV) Inject 1.25 mg into the eye every 6 (six) weeks. evry 12 weeks     clopidogrel (PLAVIX) 75 MG tablet Take 1 tablet (75 mg total) by mouth daily. 90 tablet 1   Multiple Vitamins-Minerals (PRESERVISION AREDS 2 PO) Take by mouth.     rosuvastatin (CRESTOR) 20 MG tablet Take 1 tablet (20 mg total) by mouth daily. 90 tablet 1   No current facility-administered medications for this visit.    Allergies  Allergen  Reactions   Cozaar [Losartan] Other (See Comments)    cramps   Elemental Sulfur Diarrhea and Nausea And Vomiting   Felodipine Swelling   Lisinopril Other (See Comments)   Metoprolol Swelling   Neomycin Rash   Nickel Rash   Penicillins Rash    Family History  Problem Relation Age of Onset   Cancer Mother        colon   Emphysema Father    Heart disease Father    Heart attack Father    Cancer Sister 67       breast   Lupus Sister    Heart disease Sister    Multiple sclerosis Brother    Hypertension Sister    Cancer Daughter        breast   Cancer Paternal Grandmother        female    Social History   Socioeconomic History   Marital status: Divorced    Spouse name: Not on file   Number of children: 3   Years of education: Not on file   Highest education level: 11th grade  Occupational History    Employer: RETIRED  Tobacco Use   Smoking status: Former    Packs/day: 2.00    Years: 64.00    Total pack years: 128.00    Types: Cigarettes    Quit date: 04/14/2014  Years since quitting: 8.3   Smokeless tobacco: Never   Tobacco comments:    smoking cessation materials not required  Vaping Use   Vaping Use: Never used  Substance and Sexual Activity   Alcohol use: Not Currently   Drug use: No   Sexual activity: Not Currently  Other Topics Concern   Not on file  Social History Narrative   Not on file   Social Determinants of Health   Financial Resource Strain: Low Risk  (01/04/2022)   Overall Financial Resource Strain (CARDIA)    Difficulty of Paying Living Expenses: Not hard at all  Food Insecurity: No Food Insecurity (01/04/2022)   Hunger Vital Sign    Worried About Running Out of Food in the Last Year: Never true    [place] in the Last Year: Never true  Transportation Needs: No Transportation Needs (01/04/2022)   PRAPARE - Hydrologist (Medical): No    Lack of Transportation (Non-Medical): No  Physical Activity:  Sufficiently Active (01/04/2022)   Exercise Vital Sign    Days of Exercise per Week: 7 days    Minutes of Exercise per Session: 30 min  Stress: No Stress Concern Present (01/04/2022)   [place]    Feeling of Stress : Not at all  Social Connections: Socially Isolated (01/04/2022)   Social Connection and Isolation Panel [NHANES]    Frequency of Communication with Friends and Family: More than three times a week    Frequency of Social Gatherings with Friends and Family: Once a week    Attends Religious Services: Never    Marine scientist or Organizations: No    Attends Archivist Meetings: Never    Marital Status: Divorced  Human resources officer Violence: Not At Risk (04/20/2018)   Humiliation, Afraid, Rape, and Kick questionnaire    Fear of Current or Ex-Partner: No    Emotionally Abused: No    Physically Abused: No    Sexually Abused: No     Constitutional: Denies fever, malaise, fatigue, headache or abrupt weight changes.  Respiratory: Denies difficulty breathing, shortness of breath, cough or sputum production.   Cardiovascular: Denies chest pain, chest tightness, palpitations or swelling in the hands or feet.  Musculoskeletal: Patient reports left leg swelling and tenderness.  Denies decrease in range of motion, difficulty with gait, muscle pain or joint pain.  Skin: Denies redness, rashes, lesions or ulcercations.  Neurological: Denies dizziness, difficulty with memory, difficulty with speech or problems with balance and coordination.    No other specific complaints in a complete review of systems (except as listed in HPI above).  Objective:   Physical Exam BP (!) 234/76 (BP Location: Left Arm, Patient Position: Sitting, Cuff Size: Normal)   Pulse 72   Temp (!) 97.1 F (36.2 C) (Temporal)   Wt 152 lb (68.9 kg)   SpO2 99%   BMI 29.69 kg/m   Wt Readings from Last 3 Encounters:  05/04/22 155 lb (70.3  kg)  04/12/22 151 lb (68.5 kg)  08/20/21 151 lb 12.8 oz (68.9 kg)    General: Appears her stated age, overweight, in NAD. Skin: Warm, dry and intact. PVD changes without ulceration. Cardiovascular: Normal rate and rhythm. S1,S2 noted.  No murmur, rubs or gallops noted. No JVD. 1 + pitting LLE edema.  Pulmonary/Chest: Normal effort and positive vesicular breath sounds. No respiratory distress. No wheezes, rales or ronchi noted.  Musculoskeletal: No pain  with palpation of the calf. No difficulty with gait.  Neurological: Alert and oriented.  Coordination normal.    BMET    Component Value Date/Time   NA 141 04/12/2022 1116   NA 142 09/25/2015 1135   K 3.9 04/12/2022 1116   CL 104 04/12/2022 1116   CO2 30 04/12/2022 1116   GLUCOSE 103 04/12/2022 1116   BUN 16 04/12/2022 1116   BUN 12 09/25/2015 1135   BUN 18 12/17/2014 1418   CREATININE 1.06 (H) 04/12/2022 1116   CALCIUM 9.4 04/12/2022 1116   GFRNONAA >60 02/12/2021 1125   GFRNONAA 59 (L) 12/04/2019 0933   GFRAA 68 12/04/2019 0933    Lipid Panel     Component Value Date/Time   CHOL 207 (H) 04/12/2022 1116   CHOL 218 (H) 09/25/2015 1135   TRIG 188 (H) 04/12/2022 1116   HDL 49 (L) 04/12/2022 1116   HDL 50 09/25/2015 1135   CHOLHDL 4.2 04/12/2022 1116   VLDL 22 04/18/2017 0909   LDLCALC 128 (H) 04/12/2022 1116    CBC    Component Value Date/Time   WBC 5.3 04/24/2018 0924   RBC 4.46 04/24/2018 0924   HGB 12.3 04/24/2018 0924   HGB 11.1 09/25/2015 1135   HCT 37.5 04/24/2018 0924   HCT 34.7 09/25/2015 1135   PLT 194 04/24/2018 0924   PLT 258 09/25/2015 1135   MCV 84.1 04/24/2018 0924   MCV 82 09/25/2015 1135   MCH 27.6 04/24/2018 0924   MCHC 32.8 04/24/2018 0924   RDW 14.3 04/24/2018 0924   RDW 16.8 (H) 09/25/2015 1135   LYMPHSABS 583 (L) 04/24/2018 0924   LYMPHSABS 0.8 09/25/2015 1135   MONOABS 468 12/28/2016 1043   EOSABS 21 04/24/2018 0924   EOSABS 0.1 09/25/2015 1135   BASOSABS 32 04/24/2018 0924    BASOSABS 0.0 09/25/2015 1135    Hgb A1C Lab Results  Component Value Date   HGBA1C 5.6 04/12/2022     '       Assessment & Plan:      RTC in 1 week, follow up HTN, 3 months for follow-up of chronic conditions Webb Silversmith, NP

## 2022-08-13 NOTE — Assessment & Plan Note (Signed)
With edema but without ulceration Encourage low-salt diet Encouraged elevation Encouraged her to wear the compression sleeve

## 2022-08-13 NOTE — Patient Instructions (Signed)

## 2022-08-13 NOTE — Assessment & Plan Note (Signed)
Encourage diet and exercise for weight loss 

## 2022-08-13 NOTE — Assessment & Plan Note (Signed)
Uncontrolled off meds Discussed her imminent risk for heart attack, stroke and death Rx for amlodipine-olmesartan 10-40, advised her to pick this up and take it immediately There is some confusion to whether she has had renal artery stenosis, consider ordering renal artery ultrasound in 1 week for further evaluation Reinforced DASH diet

## 2022-08-20 ENCOUNTER — Ambulatory Visit (INDEPENDENT_AMBULATORY_CARE_PROVIDER_SITE_OTHER): Payer: Medicare Other | Admitting: Internal Medicine

## 2022-08-20 ENCOUNTER — Encounter: Payer: Self-pay | Admitting: Internal Medicine

## 2022-08-20 VITALS — BP 136/72 | HR 84 | Temp 97.3°F | Wt 154.0 lb

## 2022-08-20 DIAGNOSIS — I739 Peripheral vascular disease, unspecified: Secondary | ICD-10-CM

## 2022-08-20 DIAGNOSIS — I2510 Atherosclerotic heart disease of native coronary artery without angina pectoris: Secondary | ICD-10-CM | POA: Diagnosis not present

## 2022-08-20 DIAGNOSIS — I701 Atherosclerosis of renal artery: Secondary | ICD-10-CM

## 2022-08-20 DIAGNOSIS — I708 Atherosclerosis of other arteries: Secondary | ICD-10-CM | POA: Diagnosis not present

## 2022-08-20 DIAGNOSIS — Z6829 Body mass index (BMI) 29.0-29.9, adult: Secondary | ICD-10-CM | POA: Diagnosis not present

## 2022-08-20 DIAGNOSIS — I10 Essential (primary) hypertension: Secondary | ICD-10-CM

## 2022-08-20 DIAGNOSIS — E663 Overweight: Secondary | ICD-10-CM

## 2022-08-20 DIAGNOSIS — M7989 Other specified soft tissue disorders: Secondary | ICD-10-CM | POA: Diagnosis not present

## 2022-08-20 DIAGNOSIS — I700 Atherosclerosis of aorta: Secondary | ICD-10-CM | POA: Diagnosis not present

## 2022-08-20 MED ORDER — LABETALOL HCL 100 MG PO TABS: 100.0000 mg | ORAL_TABLET | Freq: Two times a day (BID) | ORAL | 0 refills | Status: DC

## 2022-08-20 NOTE — Assessment & Plan Note (Signed)
Will obtain ultrasound LLE for further evaluation

## 2022-08-20 NOTE — Assessment & Plan Note (Signed)
Major difference between left and right arm blood pressure secondary to subclavian artery stenosis Referral to cardiology for further evaluation of this Continue amlodipine-olmesartan 10-40 Rx for labetalol 100 mg twice daily Reinforced DASH diet

## 2022-08-20 NOTE — Assessment & Plan Note (Signed)
Referral to cardiology for further evaluation and treatment

## 2022-08-20 NOTE — Progress Notes (Signed)
Subjective:    Patient ID: Angela Duffy, female    DOB: 16-Jan-1935, 87 y.o.   MRN: [id number]  HPI  Patient presents to clinic today for 1 week follow-up of HTN.  At her last visit, her BP was markedly elevated.  She was not taking her Amlodipine-Olmesartan as prescribed.  She was restarted on this and her dose was increased to 10-40 mg daily.  She has now been taking the medication as prescribed.  Her BP today is 180/58 in her left arm and 132/76 in her right arm.  ECG from 02/2021 reviewed. She did have a CTA chest from 08/2014 that showed:  IMPRESSION:  1. Tandem significant obstructive disease involving origins of both the innominate artery and right subclavian artery which would  account for the depressed right arm blood pressure. Disease at the  right subclavian origin appears more significant with estimated 70-  80% stenosis. Scattered disease is also seen throughout other great  vessels, as above, an as well as throughout the thoracic aorta.  2. Coronary atherosclerosis with 3 vessel distribution of calcified plaque is well as calcified plaque near the origin of the left main coronary artery.  3. Hepatic steatosis and evidence by CT of early cirrhosis of the liver.  4. Visible proximal celiac axis stenosis of 70- 75%.   Review of Systems  Past Medical History:  Diagnosis Date   Atherosclerosis    on Chest CT 08/2014; 70-80% innominate artery, right subclavian artery, 75% celiac artery, 3 vessel coronary artery involvement   CKD (chronic kidney disease) stage 3, GFR 30-59 ml/min (HCC)    Coronary atherosclerosis    History of tobacco use    Hyperlipidemia    Hypertension    IFG (impaired fasting glucose)    Left ventricular diastolic dysfunction    Left ventricular hypertrophy    Mitral valve regurgitation    Obesity    PVD (peripheral vascular disease) (HCC)    Renal artery atherosclerosis (HCC)    greater than 60% stenosis right renal artery   Tricuspid  regurgitation    Vitamin B12 deficiency    Vitamin D deficiency disease     Current Outpatient Medications  Medication Sig Dispense Refill   amLODipine-olmesartan (AZOR) 10-40 MG tablet Take 1 tablet by mouth daily. 90 tablet 0   Bevacizumab (AVASTIN IV) Inject 1.25 mg into the eye every 6 (six) weeks. evry 12 weeks     clopidogrel (PLAVIX) 75 MG tablet Take 1 tablet (75 mg total) by mouth daily. 90 tablet 1   Multiple Vitamins-Minerals (PRESERVISION AREDS 2 PO) Take by mouth.     rosuvastatin (CRESTOR) 20 MG tablet Take 1 tablet (20 mg total) by mouth daily. 90 tablet 1   No current facility-administered medications for this visit.    Allergies  Allergen Reactions   Cozaar [Losartan] Other (See Comments)    cramps   Elemental Sulfur Diarrhea and Nausea And Vomiting   Felodipine Swelling   Lisinopril Other (See Comments)   Metoprolol Swelling   Neomycin Rash   Nickel Rash   Penicillins Rash    Family History  Problem Relation Age of Onset   Cancer Mother        colon   Emphysema Father    Heart disease Father    Heart attack Father    Cancer Sister 37       breast   Lupus Sister    Heart disease Sister    Multiple sclerosis Brother    Hypertension  Sister    Cancer Daughter        breast   Cancer Paternal Grandmother        female    Social History   Socioeconomic History   Marital status: Divorced    Spouse name: Not on file   Number of children: 3   Years of education: Not on file   Highest education level: 11th grade  Occupational History    Employer: RETIRED  Tobacco Use   Smoking status: Former    Packs/day: 2.00    Years: 64.00    Total pack years: 128.00    Types: Cigarettes    Quit date: 04/14/2014    Years since quitting: 8.3   Smokeless tobacco: Never   Tobacco comments:    smoking cessation materials not required  Vaping Use   Vaping Use: Never used  Substance and Sexual Activity   Alcohol use: Not Currently   Drug use: No   Sexual  activity: Not Currently  Other Topics Concern   Not on file  Social History Narrative   Not on file   Social Determinants of Health   Financial Resource Strain: Low Risk  (01/04/2022)   Overall Financial Resource Strain (CARDIA)    Difficulty of Paying Living Expenses: Not hard at all  Food Insecurity: No Food Insecurity (01/04/2022)   Hunger Vital Sign    Worried About Running Out of Food in the Last Year: Never true    Ran Out of Food in the Last Year: Never true  Transportation Needs: No Transportation Needs (01/04/2022)   PRAPARE - Hydrologist (Medical): No    Lack of Transportation (Non-Medical): No  Physical Activity: Sufficiently Active (01/04/2022)   Exercise Vital Sign    Days of Exercise per Week: 7 days    Minutes of Exercise per Session: 30 min  Stress: No Stress Concern Present (01/04/2022)   [place]    Feeling of Stress : Not at all  Social Connections: Socially Isolated (01/04/2022)   Social Connection and Isolation Panel [NHANES]    Frequency of Communication with Friends and Family: More than three times a week    Frequency of Social Gatherings with Friends and Family: Once a week    Attends Religious Services: Never    Marine scientist or Organizations: No    Attends Archivist Meetings: Never    Marital Status: Divorced  Human resources officer Violence: Not At Risk (04/20/2018)   Humiliation, Afraid, Rape, and Kick questionnaire    Fear of Current or Ex-Partner: No    Emotionally Abused: No    Physically Abused: No    Sexually Abused: No     Constitutional: Denies fever, malaise, fatigue, headache or abrupt weight changes.  Respiratory: Denies difficulty breathing, shortness of breath, cough or sputum production.   Cardiovascular: Patient reports swelling in legs.  Denies chest pain, chest tightness, palpitations or swelling in the hands.  Neurological:  Denies dizziness, difficulty with memory, difficulty with speech or problems with balance and coordination.    No other specific complaints in a complete review of systems (except as listed in HPI above).     Objective:   Physical Exam  BP 136/72 (BP Location: Right Arm, Patient Position: Sitting, Cuff Size: Normal)   Pulse 84   Temp (!) 97.3 F (36.3 C) (Temporal)   Wt 154 lb (69.9 kg)   SpO2 99%   BMI 30.08  kg/m   Wt Readings from Last 3 Encounters:  08/13/22 152 lb (68.9 kg)  05/04/22 155 lb (70.3 kg)  04/12/22 151 lb (68.5 kg)    General: Appears her stated age, obese, in NAD. Skin: Vascular changes noted of the LLE without ulceration. HEENT: Head: normal shape and size; Eyes: sclera white, no icterus, conjunctiva pink, PERRLA and EOMs intact;  Cardiovascular: Normal rate and rhythm. S1,S2 noted.  No murmur, rubs or gallops noted.  1+ pitting edema LLE. Pulmonary/Chest: Normal effort and positive vesicular breath sounds. No respiratory distress. No wheezes, rales or ronchi noted.  Musculoskeletal: No difficulty with gait.  Neurological: Alert and oriented.    BMET    Component Value Date/Time   NA 141 04/12/2022 1116   NA 142 09/25/2015 1135   K 3.9 04/12/2022 1116   CL 104 04/12/2022 1116   CO2 30 04/12/2022 1116   GLUCOSE 103 04/12/2022 1116   BUN 16 04/12/2022 1116   BUN 12 09/25/2015 1135   BUN 18 12/17/2014 1418   CREATININE 1.06 (H) 04/12/2022 1116   CALCIUM 9.4 04/12/2022 1116   GFRNONAA >60 02/12/2021 1125   GFRNONAA 59 (L) 12/04/2019 0933   GFRAA 68 12/04/2019 0933    Lipid Panel     Component Value Date/Time   CHOL 207 (H) 04/12/2022 1116   CHOL 218 (H) 09/25/2015 1135   TRIG 188 (H) 04/12/2022 1116   HDL 49 (L) 04/12/2022 1116   HDL 50 09/25/2015 1135   CHOLHDL 4.2 04/12/2022 1116   VLDL 22 04/18/2017 0909   LDLCALC 128 (H) 04/12/2022 1116    CBC    Component Value Date/Time   WBC 5.3 04/24/2018 0924   RBC 4.46 04/24/2018 0924   HGB  12.3 04/24/2018 0924   HGB 11.1 09/25/2015 1135   HCT 37.5 04/24/2018 0924   HCT 34.7 09/25/2015 1135   PLT 194 04/24/2018 0924   PLT 258 09/25/2015 1135   MCV 84.1 04/24/2018 0924   MCV 82 09/25/2015 1135   MCH 27.6 04/24/2018 0924   MCHC 32.8 04/24/2018 0924   RDW 14.3 04/24/2018 0924   RDW 16.8 (H) 09/25/2015 1135   LYMPHSABS 583 (L) 04/24/2018 0924   LYMPHSABS 0.8 09/25/2015 1135   MONOABS 468 12/28/2016 1043   EOSABS 21 04/24/2018 0924   EOSABS 0.1 09/25/2015 1135   BASOSABS 32 04/24/2018 0924   BASOSABS 0.0 09/25/2015 1135    Hgb A1C Lab Results  Component Value Date   HGBA1C 5.6 04/12/2022            Assessment & Plan:     RTC in 2 months for follow-up of chronic conditions Webb Silversmith, NP

## 2022-08-20 NOTE — Assessment & Plan Note (Signed)
Encourage diet and exercise weight loss 

## 2022-08-20 NOTE — Patient Instructions (Signed)

## 2022-08-24 ENCOUNTER — Ambulatory Visit (INDEPENDENT_AMBULATORY_CARE_PROVIDER_SITE_OTHER): Payer: Medicare Other

## 2022-08-24 DIAGNOSIS — M7989 Other specified soft tissue disorders: Secondary | ICD-10-CM

## 2022-09-03 DIAGNOSIS — Z23 Encounter for immunization: Secondary | ICD-10-CM | POA: Diagnosis not present

## 2022-09-17 ENCOUNTER — Encounter (INDEPENDENT_AMBULATORY_CARE_PROVIDER_SITE_OTHER): Payer: Medicare Other | Admitting: Ophthalmology

## 2022-09-17 DIAGNOSIS — H35033 Hypertensive retinopathy, bilateral: Secondary | ICD-10-CM

## 2022-09-17 DIAGNOSIS — I10 Essential (primary) hypertension: Secondary | ICD-10-CM | POA: Diagnosis not present

## 2022-09-17 DIAGNOSIS — H43813 Vitreous degeneration, bilateral: Secondary | ICD-10-CM | POA: Diagnosis not present

## 2022-09-17 DIAGNOSIS — H353231 Exudative age-related macular degeneration, bilateral, with active choroidal neovascularization: Secondary | ICD-10-CM | POA: Diagnosis not present

## 2022-09-17 DIAGNOSIS — D3131 Benign neoplasm of right choroid: Secondary | ICD-10-CM

## 2022-10-21 ENCOUNTER — Ambulatory Visit (INDEPENDENT_AMBULATORY_CARE_PROVIDER_SITE_OTHER): Payer: Medicare Other | Admitting: Internal Medicine

## 2022-10-21 ENCOUNTER — Ambulatory Visit: Payer: Medicare Other | Admitting: Internal Medicine

## 2022-10-21 ENCOUNTER — Encounter: Payer: Self-pay | Admitting: Internal Medicine

## 2022-10-21 VITALS — BP 180/86 | HR 69 | Temp 96.9°F | Wt 147.0 lb

## 2022-10-21 DIAGNOSIS — I739 Peripheral vascular disease, unspecified: Secondary | ICD-10-CM | POA: Diagnosis not present

## 2022-10-21 DIAGNOSIS — N1831 Chronic kidney disease, stage 3a: Secondary | ICD-10-CM | POA: Diagnosis not present

## 2022-10-21 DIAGNOSIS — Z6828 Body mass index (BMI) 28.0-28.9, adult: Secondary | ICD-10-CM | POA: Diagnosis not present

## 2022-10-21 DIAGNOSIS — R7303 Prediabetes: Secondary | ICD-10-CM | POA: Diagnosis not present

## 2022-10-21 DIAGNOSIS — I10 Essential (primary) hypertension: Secondary | ICD-10-CM

## 2022-10-21 DIAGNOSIS — I708 Atherosclerosis of other arteries: Secondary | ICD-10-CM

## 2022-10-21 DIAGNOSIS — I2510 Atherosclerotic heart disease of native coronary artery without angina pectoris: Secondary | ICD-10-CM | POA: Diagnosis not present

## 2022-10-21 DIAGNOSIS — M170 Bilateral primary osteoarthritis of knee: Secondary | ICD-10-CM | POA: Diagnosis not present

## 2022-10-21 DIAGNOSIS — I700 Atherosclerosis of aorta: Secondary | ICD-10-CM

## 2022-10-21 DIAGNOSIS — E782 Mixed hyperlipidemia: Secondary | ICD-10-CM

## 2022-10-21 DIAGNOSIS — E663 Overweight: Secondary | ICD-10-CM | POA: Diagnosis not present

## 2022-10-21 MED ORDER — AMLODIPINE-OLMESARTAN 10-40 MG PO TABS: 1.0000 | ORAL_TABLET | Freq: Every day | ORAL | 0 refills | Status: DC

## 2022-10-21 NOTE — Assessment & Plan Note (Signed)
Advised her to restart olmesartan for renal protection C-Met today

## 2022-10-21 NOTE — Assessment & Plan Note (Signed)
C-Met and lipid profile today Encouraged her to consume low-fat diet Continue rosuvastatin and plavix

## 2022-10-21 NOTE — Assessment & Plan Note (Signed)
Uncontrolled but she is not compliant with her medication regimen Advised her that she should be taking amlodipine-olmesartan and labetalol Referral placed to hypertension clinic

## 2022-10-21 NOTE — Patient Instructions (Signed)
Hypertension, Adult High blood pressure (hypertension) is when the force of blood pumping through the arteries is too strong. The arteries are the blood vessels that carry blood from the heart throughout the body. Hypertension forces the heart to work harder to pump blood and may cause arteries to become narrow or stiff. Untreated or uncontrolled hypertension can lead to a heart attack, heart failure, a stroke, kidney disease, and other problems. A blood pressure reading consists of a higher number over a lower number. Ideally, your blood pressure should be below 120/80. The first ("top") number is called the systolic pressure. It is a measure of the pressure in your arteries as your heart beats. The second ("bottom") number is called the diastolic pressure. It is a measure of the pressure in your arteries as the heart relaxes. What are the causes? The exact cause of this condition is not known. There are some conditions that result in high blood pressure. What increases the risk? Certain factors may make you more likely to develop high blood pressure. Some of these risk factors are under your control, including: Smoking. Not getting enough exercise or physical activity. Being overweight. Having too much fat, sugar, calories, or salt (sodium) in your diet. Drinking too much alcohol. Other risk factors include: Having a personal history of heart disease, diabetes, high cholesterol, or kidney disease. Stress. Having a family history of high blood pressure and high cholesterol. Having obstructive sleep apnea. Age. The risk increases with age. What are the signs or symptoms? High blood pressure may not cause symptoms. Very high blood pressure (hypertensive crisis) may cause: Headache. Fast or irregular heartbeats (palpitations). Shortness of breath. Nosebleed. Nausea and vomiting. Vision changes. Severe chest pain, dizziness, and seizures. How is this diagnosed? This condition is diagnosed by  measuring your blood pressure while you are seated, with your arm resting on a flat surface, your legs uncrossed, and your feet flat on the floor. The cuff of the blood pressure monitor will be placed directly against the skin of your upper arm at the level of your heart. Blood pressure should be measured at least twice using the same arm. Certain conditions can cause a difference in blood pressure between your right and left arms. If you have a high blood pressure reading during one visit or you have normal blood pressure with other risk factors, you may be asked to: Return on a different day to have your blood pressure checked again. Monitor your blood pressure at home for 1 week or longer. If you are diagnosed with hypertension, you may have other blood or imaging tests to help your health care provider understand your overall risk for other conditions. How is this treated? This condition is treated by making healthy lifestyle changes, such as eating healthy foods, exercising more, and reducing your alcohol intake. You may be referred for counseling on a healthy diet and physical activity. Your health care provider may prescribe medicine if lifestyle changes are not enough to get your blood pressure under control and if: Your systolic blood pressure is above 130. Your diastolic blood pressure is above 80. Your personal target blood pressure may vary depending on your medical conditions, your age, and other factors. Follow these instructions at home: Eating and drinking  Eat a diet that is high in fiber and potassium, and low in sodium, added sugar, and fat. An example of this eating plan is called the DASH diet. DASH stands for Dietary Approaches to Stop Hypertension. To eat this way: Eat   plenty of fresh fruits and vegetables. Try to fill one half of your plate at each meal with fruits and vegetables. Eat whole grains, such as whole-wheat pasta, brown rice, or whole-grain bread. Fill about one  fourth of your plate with whole grains. Eat or drink low-fat dairy products, such as skim milk or low-fat yogurt. Avoid fatty cuts of meat, processed or cured meats, and poultry with skin. Fill about one fourth of your plate with lean proteins, such as fish, chicken without skin, beans, eggs, or tofu. Avoid pre-made and processed foods. These tend to be higher in sodium, added sugar, and fat. Reduce your daily sodium intake. Many people with hypertension should eat less than 1,500 mg of sodium a day. Do not drink alcohol if: Your health care provider tells you not to drink. You are pregnant, may be pregnant, or are planning to become pregnant. If you drink alcohol: Limit how much you have to: 0-1 drink a day for women. 0-2 drinks a day for men. Know how much alcohol is in your drink. In the [place] one drink equals one 12 oz bottle of beer (355 mL), one 5 oz glass of wine (148 mL), or one 1 oz glass of hard liquor (44 mL). Lifestyle  Work with your health care provider to maintain a healthy body weight or to lose weight. Ask what an ideal weight is for you. Get at least 30 minutes of exercise that causes your heart to beat faster (aerobic exercise) most days of the week. Activities may include walking, swimming, or biking. Include exercise to strengthen your muscles (resistance exercise), such as Pilates or lifting weights, as part of your weekly exercise routine. Try to do these types of exercises for 30 minutes at least 3 days a week. Do not use any products that contain nicotine or tobacco. These products include cigarettes, chewing tobacco, and vaping devices, such as e-cigarettes. If you need help quitting, ask your health care provider. Monitor your blood pressure at home as told by your health care provider. Keep all follow-up visits. This is important. Medicines Take over-the-counter and prescription medicines only as told by your health care provider. Follow directions carefully. Blood  pressure medicines must be taken as prescribed. Do not skip doses of blood pressure medicine. Doing this puts you at risk for problems and can make the medicine less effective. Ask your health care provider about side effects or reactions to medicines that you should watch for. Contact a health care provider if you: Think you are having a reaction to a medicine you are taking. Have headaches that keep coming back (recurring). Feel dizzy. Have swelling in your ankles. Have trouble with your vision. Get help right away if you: Develop a severe headache or confusion. Have unusual weakness or numbness. Feel faint. Have severe pain in your chest or abdomen. Vomit repeatedly. Have trouble breathing. These symptoms may be an emergency. Get help right away. Call 911. Do not wait to see if the symptoms will go away. Do not drive yourself to the hospital. Summary Hypertension is when the force of blood pumping through your arteries is too strong. If this condition is not controlled, it may put you at risk for serious complications. Your personal target blood pressure may vary depending on your medical conditions, your age, and other factors. For most people, a normal blood pressure is less than 120/80. Hypertension is treated with lifestyle changes, medicines, or a combination of both. Lifestyle changes include losing weight, eating a healthy,   low-sodium diet, exercising more, and limiting alcohol. This information is not intended to replace advice given to you by your health care provider. Make sure you discuss any questions you have with your health care provider. Document Revised: 06/30/2021 Document Reviewed: 06/30/2021 [employer]vier Patient Education  2023 [employer]  

## 2022-10-21 NOTE — Progress Notes (Signed)
Subjective:    Patient ID: Angela Duffy, female    DOB: [birth year], 88 y.o.   MRN: [id number]  HPI  Patient presents to clinic today for follow-up of chronic conditions.  CKD 3: Her last creatinine was 1.06, GFR 52, 04/2022.  She is taking Olmesartan for renal protection.  She does not follow with nephrology.  HLD with PVD, CAD and Aortic Atherosclerosis: Her last LDL was 128, triglycerides 188, 04/2022.  She denies myalgias on Rosuvastatin.  She is taking Plavix as well.  She does not consume low-fat diet.  HTN: Her BP today is 216/90.  She is not taking Amlodipine-Olmesartan but is taking Labetalol as prescribed.  ECG from 02/2021 reviewed.  Prediabetes: Her last A1c was 5.6%, 04/2022.  She is not taking any oral diabetic medication at this time.  She does not check her sugars.  OA: Mainly in her knees.  She takes Naproxen as needed with Duffy relief of symptoms.  She does not follow with orthopedics.  Review of Systems     Past Medical History:  Diagnosis Date   Atherosclerosis    on Chest CT 08/2014; 70-80% innominate artery, right subclavian artery, 75% celiac artery, 3 vessel coronary artery involvement   CKD (chronic kidney disease) stage 3, GFR 30-59 ml/min (HCC)    Coronary atherosclerosis    History of tobacco use    Hyperlipidemia    Hypertension    IFG (impaired fasting glucose)    Left ventricular diastolic dysfunction    Left ventricular hypertrophy    Mitral valve regurgitation    Obesity    PVD (peripheral vascular disease) (HCC)    Renal artery atherosclerosis (HCC)    greater than 60% stenosis right renal artery   Tricuspid regurgitation    Vitamin B12 deficiency    Vitamin D deficiency disease     Current Outpatient Medications  Medication Sig Dispense Refill   amLODipine-olmesartan (AZOR) 10-40 MG tablet Take 1 tablet by mouth daily. 90 tablet 0   Bevacizumab (AVASTIN IV) Inject 1.25 mg into the eye every 6 (six) weeks. evry 12 weeks      clopidogrel (PLAVIX) 75 MG tablet Take 1 tablet (75 mg total) by mouth daily. 90 tablet 1   labetalol (NORMODYNE) 100 MG tablet Take 1 tablet (100 mg total) by mouth 2 (two) times daily. 180 tablet 0   Multiple Vitamins-Minerals (PRESERVISION AREDS 2 PO) Take by mouth.     rosuvastatin (CRESTOR) 20 MG tablet Take 1 tablet (20 mg total) by mouth daily. 90 tablet 1   No current facility-administered medications for this visit.    Allergies  Allergen Reactions   Cozaar [Losartan] Other (See Comments)    cramps   Elemental Sulfur Diarrhea and Nausea And Vomiting   Felodipine Swelling   Lisinopril Other (See Comments)   Metoprolol Swelling   Neomycin Rash   Nickel Rash   Penicillins Rash    Family History  Problem Relation Age of Onset   Cancer Mother        colon   Emphysema Father    Heart disease Father    Heart attack Father    Cancer Sister 2       breast   Lupus Sister    Heart disease Sister    Multiple sclerosis Brother    Hypertension Sister    Cancer Daughter        breast   Cancer Paternal Grandmother        female  Social History   Socioeconomic History   Marital status: Divorced    Spouse name: Not on file   Number of children: 3   Years of education: Not on file   Highest education level: 11th grade  Occupational History    Employer: RETIRED  Tobacco Use   Smoking status: Former    Packs/day: 2.00    Years: 64.00    Total pack years: 128.00    Types: Cigarettes    Quit date: 04/14/2014    Years since quitting: 8.5   Smokeless tobacco: Never   Tobacco comments:    smoking cessation materials not required  Vaping Use   Vaping Use: Never used  Substance and Sexual Activity   Alcohol use: Not Currently   Drug use: No   Sexual activity: Not Currently  Other Topics Concern   Not on file  Social History Narrative   Not on file   Social Determinants of Health   Financial Resource Strain: Low Risk  (01/04/2022)   Overall Financial Resource  Strain (CARDIA)    Difficulty of Paying Living Expenses: Not hard at all  Food Insecurity: No Food Insecurity (01/04/2022)   Hunger Vital Sign    Worried About Running Out of Food in the Last Year: Never true    Ran Out of Food in the Last Year: Never true  Transportation Needs: No Transportation Needs (01/04/2022)   PRAPARE - Hydrologist (Medical): No    Lack of Transportation (Non-Medical): No  Physical Activity: Sufficiently Active (01/04/2022)   Exercise Vital Sign    Days of Exercise per Week: 7 days    Minutes of Exercise per Session: 30 min  Stress: No Stress Concern Present (01/04/2022)   [place]    Feeling of Stress : Not at all  Social Connections: Socially Isolated (01/04/2022)   Social Connection and Isolation Panel [NHANES]    Frequency of Communication with Friends and Family: More than three times a week    Frequency of Social Gatherings with Friends and Family: Once a week    Attends Religious Services: Never    Marine scientist or Organizations: No    Attends Archivist Meetings: Never    Marital Status: Divorced  Human resources officer Violence: Not At Risk (04/20/2018)   Humiliation, Afraid, Rape, and Kick questionnaire    Fear of Current or Ex-Partner: No    Emotionally Abused: No    Physically Abused: No    Sexually Abused: No     Constitutional: Denies fever, malaise, fatigue, headache or abrupt weight changes.  HEENT: Denies eye pain, eye redness, ear pain, ringing in the ears, wax buildup, runny nose, nasal congestion, bloody nose, or sore throat. Respiratory: Denies difficulty breathing, shortness of breath, cough or sputum production.   Cardiovascular: Pt reports left leg swallowing. Denies chest pain, chest tightness, palpitations or in the hands or feet.  Gastrointestinal: Denies abdominal pain, bloating, constipation, diarrhea or blood in the stool.  GU:  Denies urgency, frequency, pain with urination, burning sensation, blood in urine, odor or discharge. Musculoskeletal: Patient reports knee pain.  Denies decrease in range of motion, difficulty with gait, muscle pain or joint swelling.  Skin: Denies redness, rashes, lesions or ulcercations.  Neurological: Denies dizziness, difficulty with memory, difficulty with speech or problems with balance and coordination.  Psych: Denies anxiety, depression, SI/HI.  No other specific complaints in a complete review of systems (except as  listed in HPI above).  Objective:   Physical Exam   BP (!) 180/86 (BP Location: Right Arm, Patient Position: Sitting, Cuff Size: Large)   Pulse 69   Temp (!) 96.9 F (36.1 C) (Temporal)   Wt 147 lb (66.7 kg)   SpO2 96%   BMI 28.71 kg/m    Wt Readings from Last 3 Encounters:  10/21/22 147 lb (66.7 kg)  08/20/22 154 lb (69.9 kg)  08/13/22 152 lb (68.9 kg)    General: Appears her stated age, overweight, in NAD. Skin: Warm, dry and intact. HEENT: Head: normal shape and size; Eyes: sclera white, no icterus, conjunctiva pink, PERRLA and EOMs intact;  Cardiovascular: Normal rate and rhythm. S1,S2 noted.  Murmur noted. No JVD or BLE edema. No carotid bruits noted. Pulmonary/Chest: Normal effort and positive vesicular breath sounds. No respiratory distress. No wheezes, rales or ronchi noted.  Musculoskeletal: Gait slow and steady without device Neurological: Alert and oriented.  Coordination normal.     BMET    Component Value Date/Time   NA 141 04/12/2022 1116   NA 142 09/25/2015 1135   K 3.9 04/12/2022 1116   CL 104 04/12/2022 1116   CO2 30 04/12/2022 1116   GLUCOSE 103 04/12/2022 1116   BUN 16 04/12/2022 1116   BUN 12 09/25/2015 1135   BUN 18 12/17/2014 1418   CREATININE 1.06 (H) 04/12/2022 1116   CALCIUM 9.4 04/12/2022 1116   GFRNONAA >60 02/12/2021 1125   GFRNONAA 59 (L) 12/04/2019 0933   GFRAA 68 12/04/2019 0933    Lipid Panel     Component  Value Date/Time   CHOL 207 (H) 04/12/2022 1116   CHOL 218 (H) 09/25/2015 1135   TRIG 188 (H) 04/12/2022 1116   HDL 49 (L) 04/12/2022 1116   HDL 50 09/25/2015 1135   CHOLHDL 4.2 04/12/2022 1116   VLDL 22 04/18/2017 0909   LDLCALC 128 (H) 04/12/2022 1116    CBC    Component Value Date/Time   WBC 5.3 04/24/2018 0924   RBC 4.46 04/24/2018 0924   HGB 12.3 04/24/2018 0924   HGB 11.1 09/25/2015 1135   HCT 37.5 04/24/2018 0924   HCT 34.7 09/25/2015 1135   PLT 194 04/24/2018 0924   PLT 258 09/25/2015 1135   MCV 84.1 04/24/2018 0924   MCV 82 09/25/2015 1135   MCH 27.6 04/24/2018 0924   MCHC 32.8 04/24/2018 0924   RDW 14.3 04/24/2018 0924   RDW 16.8 (H) 09/25/2015 1135   LYMPHSABS 583 (L) 04/24/2018 0924   LYMPHSABS 0.8 09/25/2015 1135   MONOABS 468 12/28/2016 1043   EOSABS 21 04/24/2018 0924   EOSABS 0.1 09/25/2015 1135   BASOSABS 32 04/24/2018 0924   BASOSABS 0.0 09/25/2015 1135    Hgb A1C Lab Results  Component Value Date   HGBA1C 5.6 04/12/2022           Assessment & Plan:     RTC in 6 months, follow up chronic conditions

## 2022-10-21 NOTE — Assessment & Plan Note (Signed)
A1c today Encourage low-carb diet and exercise for weight

## 2022-10-21 NOTE — Assessment & Plan Note (Signed)
Encourage weight loss as this can help reduce joint pain Avoid naproxen due to CKD

## 2022-10-21 NOTE — Assessment & Plan Note (Signed)
Encourage diet and exercise for weight loss 

## 2022-10-21 NOTE — Progress Notes (Deleted)
Subjective:    Patient ID: Angela Duffy, female    DOB: 10-04-1934, 88 y.o.   MRN: [id number]  HPI  Patient presents to clinic today for follow-up of chronic conditions.  CKD 3: Her last creatinine was 1.06, GFR 52, 04/2022.  She is taking Olmesartan for renal protection..  She does not follow with nephrology.  HLD with CAD, PVD and Aortic Atherosclerosis: Her last LDL was 128, triglycerides 188, 04/2022.  She denies myalgias on Rosuvastatin.  She is taking Plavix as well.  She does not consume a low-fat diet.  HTN: Her BP today is.  She is taking Amlodipine-Olmesartan and Labetalol as prescribed.  ECG from 02/2021 reviewed.  Prediabetes: Her last A1c was 5.6%, 04/2022.  She is not taking any oral diabetic medication at this time.  She does not check her sugars.  OA: Mainly in her knees.  She takes Naproxen as needed with Duffy relief of symptoms.  She does not follow with orthopedics.  Review of Systems     Past Medical History:  Diagnosis Date   Atherosclerosis    on Chest CT 08/2014; 70-80% innominate artery, right subclavian artery, 75% celiac artery, 3 vessel coronary artery involvement   CKD (chronic kidney disease) stage 3, GFR 30-59 ml/min (HCC)    Coronary atherosclerosis    History of tobacco use    Hyperlipidemia    Hypertension    IFG (impaired fasting glucose)    Left ventricular diastolic dysfunction    Left ventricular hypertrophy    Mitral valve regurgitation    Obesity    PVD (peripheral vascular disease) (HCC)    Renal artery atherosclerosis (HCC)    greater than 60% stenosis right renal artery   Tricuspid regurgitation    Vitamin B12 deficiency    Vitamin D deficiency disease     Current Outpatient Medications  Medication Sig Dispense Refill   amLODipine-olmesartan (AZOR) 10-40 MG tablet Take 1 tablet by mouth daily. 90 tablet 0   Bevacizumab (AVASTIN IV) Inject 1.25 mg into the eye every 6 (six) weeks. evry 12 weeks     clopidogrel (PLAVIX) 75  MG tablet Take 1 tablet (75 mg total) by mouth daily. 90 tablet 1   labetalol (NORMODYNE) 100 MG tablet Take 1 tablet (100 mg total) by mouth 2 (two) times daily. 180 tablet 0   Multiple Vitamins-Minerals (PRESERVISION AREDS 2 PO) Take by mouth.     rosuvastatin (CRESTOR) 20 MG tablet Take 1 tablet (20 mg total) by mouth daily. 90 tablet 1   No current facility-administered medications for this visit.    Allergies  Allergen Reactions   Cozaar [Losartan] Other (See Comments)    cramps   Elemental Sulfur Diarrhea and Nausea And Vomiting   Felodipine Swelling   Lisinopril Other (See Comments)   Metoprolol Swelling   Neomycin Rash   Nickel Rash   Penicillins Rash    Family History  Problem Relation Age of Onset   Cancer Mother        colon   Emphysema Father    Heart disease Father    Heart attack Father    Cancer Sister 57       breast   Lupus Sister    Heart disease Sister    Multiple sclerosis Brother    Hypertension Sister    Cancer Daughter        breast   Cancer Paternal Grandmother        female    Social History  Socioeconomic History   Marital status: Divorced    Spouse name: Not on file   Number of children: 3   Years of education: Not on file   Highest education level: 11th grade  Occupational History    Employer: RETIRED  Tobacco Use   Smoking status: Former    Packs/day: 2.00    Years: 64.00    Total pack years: 128.00    Types: Cigarettes    Quit date: 04/14/2014    Years since quitting: 8.5   Smokeless tobacco: Never   Tobacco comments:    smoking cessation materials not required  Vaping Use   Vaping Use: Never used  Substance and Sexual Activity   Alcohol use: Not Currently   Drug use: No   Sexual activity: Not Currently  Other Topics Concern   Not on file  Social History Narrative   Not on file   Social Determinants of Health   Financial Resource Strain: Low Risk  (01/04/2022)   Overall Financial Resource Strain (CARDIA)     Difficulty of Paying Living Expenses: Not hard at all  Food Insecurity: No Food Insecurity (01/04/2022)   Hunger Vital Sign    Worried About Running Out of Food in the Last Year: Never true    Ran Out of Food in the Last Year: Never true  Transportation Needs: No Transportation Needs (01/04/2022)   PRAPARE - Hydrologist (Medical): No    Lack of Transportation (Non-Medical): No  Physical Activity: Sufficiently Active (01/04/2022)   Exercise Vital Sign    Days of Exercise per Week: 7 days    Minutes of Exercise per Session: 30 min  Stress: No Stress Concern Present (01/04/2022)   [place]    Feeling of Stress : Not at all  Social Connections: Socially Isolated (01/04/2022)   Social Connection and Isolation Panel [NHANES]    Frequency of Communication with Friends and Family: More than three times a week    Frequency of Social Gatherings with Friends and Family: Once a week    Attends Religious Services: Never    Marine scientist or Organizations: No    Attends Archivist Meetings: Never    Marital Status: Divorced  Human resources officer Violence: Not At Risk (04/20/2018)   Humiliation, Afraid, Rape, and Kick questionnaire    Fear of Current or Ex-Partner: No    Emotionally Abused: No    Physically Abused: No    Sexually Abused: No     Constitutional: Denies fever, malaise, fatigue, headache or abrupt weight changes.  HEENT: Denies eye pain, eye redness, ear pain, ringing in the ears, wax buildup, runny nose, nasal congestion, bloody nose, or sore throat. Respiratory: Denies difficulty breathing, shortness of breath, cough or sputum production.   Cardiovascular: Denies chest pain, chest tightness, palpitations or swelling in the hands or feet.  Gastrointestinal: Denies abdominal pain, bloating, constipation, diarrhea or blood in the stool.  GU: Denies urgency, frequency, pain with  urination, burning sensation, blood in urine, odor or discharge. Musculoskeletal: Patient reports knee pain.  Denies decrease in range of motion, difficulty with gait, muscle pain or joint swelling.  Skin: Denies redness, rashes, lesions or ulcercations.  Neurological: Denies dizziness, difficulty with memory, difficulty with speech or problems with balance and coordination.  Psych: Denies anxiety, depression, SI/HI.  No other specific complaints in a complete review of systems (except as listed in HPI above).  Objective:  Physical Exam   There were no vitals taken for this visit. Wt Readings from Last 3 Encounters:  08/20/22 154 lb (69.9 kg)  08/13/22 152 lb (68.9 kg)  05/04/22 155 lb (70.3 kg)    General: Appears their stated age, well developed, well nourished in NAD. Skin: Warm, dry and intact. No rashes, lesions or ulcerations noted. HEENT: Head: normal shape and size; Eyes: sclera white, no icterus, conjunctiva pink, PERRLA and EOMs intact; Ears: Tm's gray and intact, normal light reflex; Nose: mucosa pink and moist, septum midline; Throat/Mouth: Teeth present, mucosa pink and moist, no exudate, lesions or ulcerations noted.  Neck:  Neck supple, trachea midline. No masses, lumps or thyromegaly present.  Cardiovascular: Normal rate and rhythm. S1,S2 noted.  No murmur, rubs or gallops noted. No JVD or BLE edema. No carotid bruits noted. Pulmonary/Chest: Normal effort and positive vesicular breath sounds. No respiratory distress. No wheezes, rales or ronchi noted.  Abdomen: Soft and nontender. Normal bowel sounds. No distention or masses noted. Liver, spleen and kidneys non palpable. Musculoskeletal: Normal range of motion. No signs of joint swelling. No difficulty with gait.  Neurological: Alert and oriented. Cranial nerves II-XII grossly intact. Coordination normal.  Psychiatric: Mood and affect normal. Behavior is normal. Judgment and thought content normal.    BMET     Component Value Date/Time   NA 141 04/12/2022 1116   NA 142 09/25/2015 1135   K 3.9 04/12/2022 1116   CL 104 04/12/2022 1116   CO2 30 04/12/2022 1116   GLUCOSE 103 04/12/2022 1116   BUN 16 04/12/2022 1116   BUN 12 09/25/2015 1135   BUN 18 12/17/2014 1418   CREATININE 1.06 (H) 04/12/2022 1116   CALCIUM 9.4 04/12/2022 1116   GFRNONAA >60 02/12/2021 1125   GFRNONAA 59 (L) 12/04/2019 0933   GFRAA 68 12/04/2019 0933    Lipid Panel     Component Value Date/Time   CHOL 207 (H) 04/12/2022 1116   CHOL 218 (H) 09/25/2015 1135   TRIG 188 (H) 04/12/2022 1116   HDL 49 (L) 04/12/2022 1116   HDL 50 09/25/2015 1135   CHOLHDL 4.2 04/12/2022 1116   VLDL 22 04/18/2017 0909   LDLCALC 128 (H) 04/12/2022 1116    CBC    Component Value Date/Time   WBC 5.3 04/24/2018 0924   RBC 4.46 04/24/2018 0924   HGB 12.3 04/24/2018 0924   HGB 11.1 09/25/2015 1135   HCT 37.5 04/24/2018 0924   HCT 34.7 09/25/2015 1135   PLT 194 04/24/2018 0924   PLT 258 09/25/2015 1135   MCV 84.1 04/24/2018 0924   MCV 82 09/25/2015 1135   MCH 27.6 04/24/2018 0924   MCHC 32.8 04/24/2018 0924   RDW 14.3 04/24/2018 0924   RDW 16.8 (H) 09/25/2015 1135   LYMPHSABS 583 (L) 04/24/2018 0924   LYMPHSABS 0.8 09/25/2015 1135   MONOABS 468 12/28/2016 1043   EOSABS 21 04/24/2018 0924   EOSABS 0.1 09/25/2015 1135   BASOSABS 32 04/24/2018 0924   BASOSABS 0.0 09/25/2015 1135    Hgb A1C Lab Results  Component Value Date   HGBA1C 5.6 04/12/2022           Assessment & Plan:     RTC in 6 months, follow-up chronic conditions Webb Silversmith, NP

## 2022-10-22 LAB — COMPLETE METABOLIC PANEL WITH GFR
AG Ratio: 1.8 (calc) (ref 1.0–2.5)
ALT: 15 U/L (ref 6–29)
AST: 23 U/L (ref 10–35)
Albumin: 4.1 g/dL (ref 3.6–5.1)
Alkaline phosphatase (APISO): 66 U/L (ref 37–153)
BUN: 14 mg/dL (ref 7–25)
CO2: 25 mmol/L (ref 20–32)
Calcium: 9.2 mg/dL (ref 8.6–10.4)
Chloride: 107 mmol/L (ref 98–110)
Creat: 0.89 mg/dL (ref 0.60–0.95)
Globulin: 2.3 g/dL (calc) (ref 1.9–3.7)
Glucose, Bld: 97 mg/dL (ref 65–99)
Potassium: 4.6 mmol/L (ref 3.5–5.3)
Sodium: 143 mmol/L (ref 135–146)
Total Bilirubin: 0.6 mg/dL (ref 0.2–1.2)
Total Protein: 6.4 g/dL (ref 6.1–8.1)
eGFR: 63 mL/min/{1.73_m2} (ref >=60)

## 2022-10-22 LAB — LIPID PANEL
Cholesterol: 231 mg/dL — ABNORMAL HIGH (ref <200)
HDL: 54 mg/dL (ref >=50)
LDL Cholesterol (Calc): 151 mg/dL (calc) — ABNORMAL HIGH
Non-HDL Cholesterol (Calc): 177 mg/dL (calc) — ABNORMAL HIGH (ref <130)
Total CHOL/HDL Ratio: 4.3 (calc) (ref <5.0)
Triglycerides: 140 mg/dL (ref <150)

## 2022-10-22 LAB — CBC
HCT: 34.1 % — ABNORMAL LOW (ref 35.0–45.0)
Hemoglobin: 10.8 g/dL — ABNORMAL LOW (ref 11.7–15.5)
MCH: 26.7 pg — ABNORMAL LOW (ref 27.0–33.0)
MCHC: 31.7 g/dL — ABNORMAL LOW (ref 32.0–36.0)
MCV: 84.2 fL (ref 80.0–100.0)
MPV: 11.8 fL (ref 7.5–12.5)
Platelets: 193 10*3/uL (ref 140–400)
RBC: 4.05 10*6/uL (ref 3.80–5.10)
RDW: 14 % (ref 11.0–15.0)
WBC: 5.4 10*3/uL (ref 3.8–10.8)

## 2022-10-22 LAB — HEMOGLOBIN A1C
Hgb A1c MFr Bld: 5.8 % of total Hgb — ABNORMAL HIGH (ref <5.7)
Mean Plasma Glucose: 120 mg/dL
eAG (mmol/L): 6.6 mmol/L

## 2022-12-23 ENCOUNTER — Telehealth: Payer: Self-pay | Admitting: Internal Medicine

## 2022-12-23 NOTE — Telephone Encounter (Signed)
Contacted Angela Duffy to schedule their annual wellness visit. Appointment made for 01/14/2023.  Verlee Rossetti; Care Guide Ambulatory Clinical Support [place] [facility] Direct Dial: [phone]

## 2022-12-27 ENCOUNTER — Telehealth: Payer: Self-pay | Admitting: Internal Medicine

## 2022-12-27 NOTE — Telephone Encounter (Signed)
Contacted Angela Duffy to schedule their annual wellness visit. Appointment made for 01/20/2023.  Angela Duffy; Care Guide Ambulatory Clinical Support [place] [facility] Direct Dial: [phone]

## 2023-01-07 ENCOUNTER — Encounter (INDEPENDENT_AMBULATORY_CARE_PROVIDER_SITE_OTHER): Payer: Medicare Other | Admitting: Ophthalmology

## 2023-01-07 DIAGNOSIS — H43813 Vitreous degeneration, bilateral: Secondary | ICD-10-CM

## 2023-01-07 DIAGNOSIS — I10 Essential (primary) hypertension: Secondary | ICD-10-CM

## 2023-01-07 DIAGNOSIS — H353231 Exudative age-related macular degeneration, bilateral, with active choroidal neovascularization: Secondary | ICD-10-CM | POA: Diagnosis not present

## 2023-01-07 DIAGNOSIS — H35033 Hypertensive retinopathy, bilateral: Secondary | ICD-10-CM

## 2023-01-14 ENCOUNTER — Encounter (INDEPENDENT_AMBULATORY_CARE_PROVIDER_SITE_OTHER): Payer: Medicare Other | Admitting: Ophthalmology

## 2023-01-14 ENCOUNTER — Other Ambulatory Visit: Payer: Self-pay | Admitting: Family Medicine

## 2023-01-14 NOTE — Telephone Encounter (Signed)
Requested Prescriptions  Pending Prescriptions Disp Refills   amLODipine-olmesartan (AZOR) 10-40 MG tablet [Pharmacy Med Name: AMLODIPINE-OLMESARTAN 10-40 MG] 90 tablet 0    Sig: TAKE 1 TABLET BY MOUTH DAILY     Cardiovascular: CCB + ARB Combos Failed - 01/14/2023  2:16 AM      Failed - Last BP in normal range    BP Readings from Last 1 Encounters:  10/21/22 (!) 180/86         Passed - K in normal range and within 180 days    Potassium  Date Value Ref Range Status  10/21/2022 4.6 3.5 - 5.3 mmol/L Final         Passed - Cr in normal range and within 180 days    Creat  Date Value Ref Range Status  10/21/2022 0.89 0.60 - 0.95 mg/dL Final   Creatinine, Urine  Date Value Ref Range Status  06/18/2016 65 20 - 320 mg/dL Final         Passed - Na in normal range and within 180 days    Sodium  Date Value Ref Range Status  10/21/2022 143 135 - 146 mmol/L Final  09/25/2015 142 134 - 144 mmol/L Final         Passed - Patient is not pregnant      Passed - Valid encounter within last 6 months    Recent Outpatient Visits           2 months ago Uncontrolled hypertension   [place] [facility] [place], Salvadore Oxford, NP   4 months ago Right subclavian artery occlusion   [facility] [place], Salvadore Oxford, NP   5 months ago Hypertensive urgency   [place] [facility] [place], Salvadore Oxford, NP   8 months ago Benign essential hypertension   [place] [facility] [place], Salvadore Oxford, NP   9 months ago Prediabetes   [place] [facility] [place], Salvadore Oxford, [place]

## 2023-01-20 ENCOUNTER — Ambulatory Visit (INDEPENDENT_AMBULATORY_CARE_PROVIDER_SITE_OTHER): Payer: Medicare Other

## 2023-01-20 VITALS — Wt 147.0 lb

## 2023-01-20 DIAGNOSIS — Z0000 Encounter for general adult medical examination without abnormal findings: Secondary | ICD-10-CM

## 2023-01-20 NOTE — Progress Notes (Signed)
I connected with  Loraine Maple Levier on 01/20/23 by a audio enabled telemedicine application and verified that I am speaking with the correct person using two identifiers.  Patient Location: Home  Provider Location: Office/Clinic  I discussed the limitations of evaluation and management by telemedicine. The patient expressed understanding and agreed to proceed.  Subjective:   FRANZISKA STRENGTH is a 88 y.o. female who presents for Medicare Annual (Subsequent) preventive examination.  Review of Systems     Cardiac Risk Factors include: advanced age (>4men, >63 women);dyslipidemia;hypertension     Objective:    Today's Vitals   01/20/23 1428  PainSc: 6    There is no height or weight on file to calculate BMI.     01/20/2023    2:36 PM 02/12/2021   10:16 AM 04/20/2018    2:38 PM 06/29/2017    9:57 AM 12/28/2016    9:58 AM 07/07/2016   11:40 AM 06/29/2016    2:01 PM  Advanced Directives  Does Patient Have a Medical Advance Directive? No No No No No No No  Would patient like information on creating a medical advance directive? No - Patient declined  No - Patient declined   No - patient declined information No - patient declined information    Current Medications (verified) Outpatient Encounter Medications as of 01/20/2023  Medication Sig   amLODipine-olmesartan (AZOR) 10-40 MG tablet TAKE 1 TABLET BY MOUTH DAILY   Bevacizumab (AVASTIN IV) Inject 1.25 mg into the eye every 6 (six) weeks. evry 12 weeks   clopidogrel (PLAVIX) 75 MG tablet Take 1 tablet (75 mg total) by mouth daily.   labetalol (NORMODYNE) 100 MG tablet Take 1 tablet (100 mg total) by mouth 2 (two) times daily.   Multiple Vitamins-Minerals (PRESERVISION AREDS 2 PO) Take by mouth.   rosuvastatin (CRESTOR) 20 MG tablet Take 1 tablet (20 mg total) by mouth daily.   No facility-administered encounter medications on file as of 01/20/2023.    Allergies (verified) Cozaar [losartan], Elemental sulfur, Felodipine,  Lisinopril, Metoprolol, Neomycin, Nickel, and Penicillins   History: Past Medical History:  Diagnosis Date   Atherosclerosis    on Chest CT 08/2014; 70-80% innominate artery, right subclavian artery, 75% celiac artery, 3 vessel coronary artery involvement   CKD (chronic kidney disease) stage 3, GFR 30-59 ml/min (HCC)    Coronary atherosclerosis    History of tobacco use    Hyperlipidemia    Hypertension    IFG (impaired fasting glucose)    Left ventricular diastolic dysfunction    Left ventricular hypertrophy    Mitral valve regurgitation    Obesity    PVD (peripheral vascular disease) (HCC)    Renal artery atherosclerosis (HCC)    greater than 60% stenosis right renal artery   Tricuspid regurgitation    Vitamin B12 deficiency    Vitamin D deficiency disease    Past Surgical History:  Procedure Laterality Date   CAROTID SURGERY  Feb 2004   CERVICAL DISCECTOMY  1970s   ILIAC ARTERY STENT  2003   KIDNEY STENT  April 2016   LAPAROSCOPY  1970s   Family History  Problem Relation Age of Onset   Cancer Mother        colon   Emphysema Father    Heart disease Father    Heart attack Father    Cancer Sister 56       breast   Lupus Sister    Heart disease Sister    Multiple sclerosis Brother  Hypertension Sister    Cancer Daughter        breast   Cancer Paternal Grandmother        female   Social History   Socioeconomic History   Marital status: Divorced    Spouse name: Not on file   Number of children: 3   Years of education: Not on file   Highest education level: 11th grade  Occupational History    Employer: RETIRED  Tobacco Use   Smoking status: Former    Packs/day: 2.00    Years: 64.00    Additional pack years: 0.00    Total pack years: 128.00    Types: Cigarettes    Quit date: 04/14/2014    Years since quitting: 8.7   Smokeless tobacco: Never   Tobacco comments:    smoking cessation materials not required  Vaping Use   Vaping Use: Never used   Substance and Sexual Activity   Alcohol use: Not Currently   Drug use: No   Sexual activity: Not Currently  Other Topics Concern   Not on file  Social History Narrative   Not on file   Social Determinants of Health   Financial Resource Strain: Low Risk  (01/20/2023)   Overall Financial Resource Strain (CARDIA)    Difficulty of Paying Living Expenses: Not hard at all  Food Insecurity: No Food Insecurity (01/20/2023)   Hunger Vital Sign    Worried About Running Out of Food in the Last Year: Never true    Ran Out of Food in the Last Year: Never true  Transportation Needs: No Transportation Needs (01/20/2023)   PRAPARE - Administrator, Civil Service (Medical): No    Lack of Transportation (Non-Medical): No  Physical Activity: Insufficiently Active (01/20/2023)   Exercise Vital Sign    Days of Exercise per Week: 3 days    Minutes of Exercise per Session: 30 min  Stress: No Stress Concern Present (01/20/2023)   [employer] of Occupational Health - Occupational Stress Questionnaire    Feeling of Stress : Only a little  Social Connections: Socially Isolated (01/20/2023)   Social Connection and Isolation Panel [NHANES]    Frequency of Communication with Friends and Family: More than three times a week    Frequency of Social Gatherings with Friends and Family: Once a week    Attends Religious Services: Never    Database administrator or Organizations: No    Attends Engineer, structural: Never    Marital Status: Divorced    Tobacco Counseling Counseling given: Not Answered Tobacco comments: smoking cessation materials not required   Clinical Intake:  Pre-visit preparation completed: Yes  Pain : 0-10 Pain Score: 6  Pain Type: Chronic pain Pain Location: Leg Pain Orientation: Right, Left     Nutritional Risks: None Diabetes: No  How often do you need to have someone help you when you read instructions, pamphlets, or other written materials from  your doctor or pharmacy?: 1 - Never  Diabetic?no  Interpreter Needed?: No  Information entered by :: Kennedy Bucker, LPN   Activities of Daily Living    01/20/2023    2:38 PM 10/21/2022    2:44 PM  In your present state of health, do you have any difficulty performing the following activities:  Hearing? 0   Vision? 0 1  Difficulty concentrating or making decisions? 0 1  Walking or climbing stairs? 1 1  Dressing or bathing? 0 0  Doing errands, shopping? 0 0  Preparing Food and eating ? N   Using the Toilet? N   In the past six months, have you accidently leaked urine? N   Do you have problems with loss of bowel control? N   Managing your Medications? N   Managing your Finances? N   Housekeeping or managing your Housekeeping? N     Patient Care Team: Lorre Munroe, NP as PCP - General (Internal Medicine) Lady Gary Darlin Priestly, MD as Consulting Physician (Cardiology) Schnier, Latina Craver, MD (Vascular Surgery) Sherrie George, MD as Consulting Physician (Ophthalmology)  Indicate any recent Medical Services you may have received from other than [facility] providers in the past year (date may be approximate).     Assessment:   This is a routine wellness examination for [place].  Hearing/Vision screen Hearing Screening - Comments:: No aids Vision Screening - Comments:: Wears glasses- Dr.Matthews  Dietary issues and exercise activities discussed: Current Exercise Habits: Home exercise routine, Type of exercise: walking, Time (Minutes): 20, Frequency (Times/Week): 2, Weekly Exercise (Minutes/Week): 40, Intensity: Mild   Goals Addressed             This Visit's Progress    DIET - INCREASE WATER INTAKE         Depression Screen    01/20/2023    2:33 PM 10/21/2022    2:43 PM 08/13/2022    2:50 PM 01/04/2022    1:34 PM 11/10/2020   11:20 AM 04/07/2020   10:11 AM 12/18/2019    9:27 AM  PHQ 2/9 Scores  PHQ - 2 Score 2 0 0 0 0 0 0  PHQ- 9 Score 4     0 0    Fall Risk    01/20/2023     2:37 PM 10/21/2022    2:44 PM 08/13/2022    2:50 PM 01/04/2022    1:55 PM 11/10/2020   11:20 AM  Fall Risk   Falls in the past year? 0 1 0 0 0  Number falls in past yr: 0 0  0 0  Injury with Fall? 0 0 0 0 0  Risk for fall due to : No Fall Risks;Impaired balance/gait Impaired balance/gait;Impaired vision  No Fall Risks   Follow up Falls prevention discussed;Falls evaluation completed   Falls evaluation completed     FALL RISK PREVENTION PERTAINING TO THE HOME:  Any stairs in or around the home? Yes  If so, are there any without handrails? No  Home free of loose throw rugs in walkways, pet beds, electrical cords, etc? Yes  Adequate lighting in your home to reduce risk of falls? Yes   ASSISTIVE DEVICES UTILIZED TO PREVENT FALLS:  Life alert? No  Use of a cane, walker or w/c? Yes - walker occasionally Grab bars in the bathroom? Yes  Shower chair or bench in shower? No  Elevated toilet seat or a handicapped toilet? Yes    Cognitive Function:        01/20/2023    2:41 PM 01/04/2022    1:54 PM 04/20/2018    2:41 PM  6CIT Screen  What Year? 0 points 0 points 0 points  What month? 0 points 0 points 0 points  What time? 3 points 0 points 3 points  Count back from 20 0 points 0 points 0 points  Months in reverse 0 points 2 points 0 points  Repeat phrase 2 points 2 points 0 points  Total Score 5 points 4 points 3 points    Immunizations Immunization History  Administered Date(s) Administered   Influenza Split 05/21/2021   Influenza-Unspecified 06/09/2017, 05/25/2018, 05/08/2019   PFIZER(Purple Top)SARS-COV-2 Vaccination 10/02/2019, 10/23/2019, 06/07/2020   Pneumococcal Conjugate-13 03/18/2014   Pneumococcal Polysaccharide-23 04/30/2002, 09/06/2012   Tdap 04/24/2018    TDAP status: Up to date  Flu Vaccine status: Declined, Education has been provided regarding the importance of this vaccine but patient still declined. Advised may receive this vaccine at local pharmacy or  Health Dept. Aware to provide a copy of the vaccination record if obtained from local pharmacy or Health Dept. Verbalized acceptance and understanding.  Pneumococcal vaccine status: Up to date  Covid-19 vaccine status: Completed vaccines  Qualifies for Shingles Vaccine? Yes   Zostavax completed No   Shingrix Completed?: No.    Education has been provided regarding the importance of this vaccine. Patient has been advised to call insurance company to determine out of pocket expense if they have not yet received this vaccine. Advised may also receive vaccine at local pharmacy or Health Dept. Verbalized acceptance and understanding.  Screening Tests Health Maintenance  Topic Date Due   DEXA SCAN  Never done   Zoster Vaccines- Shingrix (1 of 2) Never done   COVID-19 Vaccine (4 - 2023-24 season) 05/07/2022   INFLUENZA VACCINE  04/07/2023   Medicare Annual Wellness (AWV)  01/20/2024   DTaP/Tdap/Td (2 - Td or Tdap) 04/24/2028   Pneumonia Vaccine 68+ Years old  Completed   HPV VACCINES  Aged Out   MAMMOGRAM  Discontinued    Health Maintenance  Health Maintenance Due  Topic Date Due   DEXA SCAN  Never done   Zoster Vaccines- Shingrix (1 of 2) Never done   COVID-19 Vaccine (4 - 2023-24 season) 05/07/2022    Colorectal cancer screening: No longer required.   Mammogram status: No longer required due to age.   Lung Cancer Screening: (Low Dose CT Chest recommended if Age 42-80 years, 30 pack-year currently smoking OR have quit w/in 15years.) does not qualify.   Additional Screening:  Hepatitis C Screening: does not qualify; Completed no  Vision Screening: Recommended annual ophthalmology exams for early detection of glaucoma and other disorders of the eye. Is the patient up to date with their annual eye exam?  Yes  Who is the provider or what is the name of the office in which the patient attends annual eye exams? Dr.Matthews If pt is not established with a provider, would they like  to be referred to a provider to establish care? No .   Dental Screening: Recommended annual dental exams for proper oral hygiene  Community Resource Referral / Chronic Care Management: CRR required this visit?  No   CCM required this visit?  No      Plan:     I have personally reviewed and noted the following in the patient's chart:   Medical and social history Use of alcohol, tobacco or illicit drugs  Current medications and supplements including opioid prescriptions. Patient is not currently taking opioid prescriptions. Functional ability and status Nutritional status Physical activity Advanced directives List of other physicians Hospitalizations, surgeries, and ER visits in previous 12 months Vitals Screenings to include cognitive, depression, and falls Referrals and appointments  In addition, I have reviewed and discussed with patient certain preventive protocols, quality metrics, and best practice recommendations. A written personalized care plan for preventive services as well as general preventive health recommendations were provided to patient.     Hal Hope, LPN   [id number]   Nurse Notes: none

## 2023-01-20 NOTE — Patient Instructions (Signed)
[name] , Thank you for taking time to come for your Medicare Wellness Visit. I appreciate your ongoing commitment to your health goals. Please review the following plan we discussed and let me know if I can assist you in the future.   These are the goals we discussed:  Goals      DIET - INCREASE WATER INTAKE     Recommend to drink at least 6-8 8oz glasses of water per day.     DIET - INCREASE WATER INTAKE        This is a list of the screening recommended for you and due dates:  Health Maintenance  Topic Date Due   DEXA scan (bone density measurement)  Never done   Zoster (Shingles) Vaccine (1 of 2) Never done   COVID-19 Vaccine (4 - 2023-24 season) 05/07/2022   Flu Shot  04/07/2023   Medicare Annual Wellness Visit  01/20/2024   DTaP/Tdap/Td vaccine (2 - Td or Tdap) 04/24/2028   Pneumonia Vaccine  Completed   HPV Vaccine  Aged Out   Mammogram  Discontinued    Advanced directives: no  Conditions/risks identified: none  Next appointment: Follow up in one year for your annual wellness visit 01/26/24 @ 2:30 pm by phone   Preventive Care 65 Years and Older, Female Preventive care refers to lifestyle choices and visits with your health care provider that can promote health and wellness. What does preventive care include? A yearly physical exam. This is also called an annual well check. Dental exams once or twice a year. Routine eye exams. Ask your health care provider how often you should have your eyes checked. Personal lifestyle choices, including: Daily care of your teeth and gums. Regular physical activity. Eating a healthy diet. Avoiding tobacco and drug use. Limiting alcohol use. Practicing safe sex. Taking low-dose aspirin every day. Taking vitamin and mineral supplements as recommended by your health care provider. What happens during an annual well check? The services and screenings done by your health care provider during your annual well check will depend  on your age, overall health, lifestyle risk factors, and family history of disease. Counseling  Your health care provider may ask you questions about your: Alcohol use. Tobacco use. Drug use. Emotional well-being. Home and relationship well-being. Sexual activity. Eating habits. History of falls. Memory and ability to understand (cognition). Work and work Astronomer. Reproductive health. Screening  You may have the following tests or measurements: Height, weight, and BMI. Blood pressure. Lipid and cholesterol levels. These may be checked every 5 years, or more frequently if you are over 74 years old. Skin check. Lung cancer screening. You may have this screening every year starting at age 97 if you have a 30-pack-year history of smoking and currently smoke or have quit within the past 15 years. Fecal occult blood test (FOBT) of the stool. You may have this test every year starting at age 46. Flexible sigmoidoscopy or colonoscopy. You may have a sigmoidoscopy every 5 years or a colonoscopy every 10 years starting at age 44. Hepatitis C blood test. Hepatitis B blood test. Sexually transmitted disease (STD) testing. Diabetes screening. This is done by checking your blood sugar (glucose) after you have not eaten for a while (fasting). You may have this done every 1-3 years. Bone density scan. This is done to screen for osteoporosis. You may have this done starting at age 45. Mammogram. This may be done every 1-2 years. Talk to your health care provider about how often  you should have regular mammograms. Talk with your health care provider about your test results, treatment options, and if necessary, the need for more tests. Vaccines  Your health care provider may recommend certain vaccines, such as: Influenza vaccine. This is recommended every year. Tetanus, diphtheria, and acellular pertussis (Tdap, Td) vaccine. You may need a Td booster every 10 years. Zoster vaccine. You may need  this after age 48. Pneumococcal 13-valent conjugate (PCV13) vaccine. One dose is recommended after age 53. Pneumococcal polysaccharide (PPSV23) vaccine. One dose is recommended after age 93. Talk to your health care provider about which screenings and vaccines you need and how often you need them. This information is not intended to replace advice given to you by your health care provider. Make sure you discuss any questions you have with your health care provider. Document Released: 09/19/2015 Document Revised: 05/12/2016 Document Reviewed: 06/24/2015 Elsevier Interactive Patient Education  2017 [place] Prevention in the Home Falls can cause injuries. They can happen to people of all ages. There are many things you can do to make your home safe and to help prevent falls. What can I do on the outside of my home? Regularly fix the edges of walkways and driveways and fix any cracks. Remove anything that might make you trip as you walk through a door, such as a raised step or threshold. Trim any bushes or trees on the path to your home. Use bright outdoor lighting. Clear any walking paths of anything that might make someone trip, such as rocks or tools. Regularly check to see if handrails are loose or broken. Make sure that both sides of any steps have handrails. Any raised decks and porches should have guardrails on the edges. Have any leaves, snow, or ice cleared regularly. Use sand or salt on walking paths during winter. Clean up any spills in your garage right away. This includes oil or grease spills. What can I do in the bathroom? Use night lights. Install grab bars by the toilet and in the tub and shower. Do not use towel bars as grab bars. Use non-skid mats or decals in the tub or shower. If you need to sit down in the shower, use a plastic, non-slip stool. Keep the floor dry. Clean up any water that spills on the floor as soon as it happens. Remove soap buildup in the tub  or shower regularly. Attach bath mats securely with double-sided non-slip rug tape. Do not have throw rugs and other things on the floor that can make you trip. What can I do in the bedroom? Use night lights. Make sure that you have a light by your bed that is easy to reach. Do not use any sheets or blankets that are too big for your bed. They should not hang down onto the floor. Have a firm chair that has side arms. You can use this for support while you get dressed. Do not have throw rugs and other things on the floor that can make you trip. What can I do in the kitchen? Clean up any spills right away. Avoid walking on wet floors. Keep items that you use a lot in easy-to-reach places. If you need to reach something above you, use a strong step stool that has a grab bar. Keep electrical cords out of the way. Do not use floor polish or wax that makes floors slippery. If you must use wax, use non-skid floor wax. Do not have throw rugs and other things on  the floor that can make you trip. What can I do with my stairs? Do not leave any items on the stairs. Make sure that there are handrails on both sides of the stairs and use them. Fix handrails that are broken or loose. Make sure that handrails are as long as the stairways. Check any carpeting to make sure that it is firmly attached to the stairs. Fix any carpet that is loose or worn. Avoid having throw rugs at the top or bottom of the stairs. If you do have throw rugs, attach them to the floor with carpet tape. Make sure that you have a light switch at the top of the stairs and the bottom of the stairs. If you do not have them, ask someone to add them for you. What else can I do to help prevent falls? Wear shoes that: Do not have high heels. Have rubber bottoms. Are comfortable and fit you well. Are closed at the toe. Do not wear sandals. If you use a stepladder: Make sure that it is fully opened. Do not climb a closed stepladder. Make  sure that both sides of the stepladder are locked into place. Ask someone to hold it for you, if possible. Clearly mark and make sure that you can see: Any grab bars or handrails. First and last steps. Where the edge of each step is. Use tools that help you move around (mobility aids) if they are needed. These include: Canes. Walkers. Scooters. Crutches. Turn on the lights when you go into a dark area. Replace any light bulbs as soon as they burn out. Set up your furniture so you have a clear path. Avoid moving your furniture around. If any of your floors are uneven, fix them. If there are any pets around you, be aware of where they are. Review your medicines with your doctor. Some medicines can make you feel dizzy. This can increase your chance of falling. Ask your doctor what other things that you can do to help prevent falls. This information is not intended to replace advice given to you by your health care provider. Make sure you discuss any questions you have with your health care provider. Document Released: 06/19/2009 Document Revised: 01/29/2016 Document Reviewed: 09/27/2014 Elsevier Interactive Patient Education  2017 [employer].

## 2023-01-25 ENCOUNTER — Encounter: Payer: Self-pay | Admitting: Internal Medicine

## 2023-01-25 ENCOUNTER — Ambulatory Visit (INDEPENDENT_AMBULATORY_CARE_PROVIDER_SITE_OTHER): Payer: Medicare Other | Admitting: Internal Medicine

## 2023-01-25 VITALS — BP 134/82 | HR 91 | Temp 96.4°F | Wt 144.0 lb

## 2023-01-25 DIAGNOSIS — I701 Atherosclerosis of renal artery: Secondary | ICD-10-CM | POA: Diagnosis not present

## 2023-01-25 DIAGNOSIS — I739 Peripheral vascular disease, unspecified: Secondary | ICD-10-CM

## 2023-01-25 MED ORDER — ROSUVASTATIN CALCIUM 40 MG PO TABS: 40.0000 mg | ORAL_TABLET | Freq: Every day | ORAL | 0 refills | Status: DC

## 2023-01-25 MED ORDER — CLOPIDOGREL BISULFATE 75 MG PO TABS: 75.0000 mg | ORAL_TABLET | Freq: Every day | ORAL | 0 refills | Status: DC

## 2023-01-25 NOTE — Progress Notes (Signed)
Subjective:    Patient ID: Angela Duffy, female    DOB: 01-06-1935, 88 y.o.   MRN: [id number]  HPI  Pt comes in today complaining of bilateral leg pain.  Pt states it started about 2-3 months but has been worsening for the last 3 weeks. She describes the pain as cramping, mainly in her calves. The pain is worse with ambulation and better with rest. She reports the calves can be tender to touch. Pt denies swelling, redness in the legs. She denies numbness or tingling in the legs. She has tried Naproxen with minimal relief of symptoms. She has a history of PAD although there is no ABI's on file. She had a stent placed in her iliac artery. She reports she smoked for 64 years.She also reports she has osteoarthritis in her knees.   Review of Systems     Past Medical History:  Diagnosis Date   Atherosclerosis    on Chest CT 08/2014; 70-80% innominate artery, right subclavian artery, 75% celiac artery, 3 vessel coronary artery involvement   CKD (chronic kidney disease) stage 3, GFR 30-59 ml/min (HCC)    Coronary atherosclerosis    History of tobacco use    Hyperlipidemia    Hypertension    IFG (impaired fasting glucose)    Left ventricular diastolic dysfunction    Left ventricular hypertrophy    Mitral valve regurgitation    Obesity    PVD (peripheral vascular disease) (HCC)    Renal artery atherosclerosis (HCC)    greater than 60% stenosis right renal artery   Tricuspid regurgitation    Vitamin B12 deficiency    Vitamin D deficiency disease     Current Outpatient Medications  Medication Sig Dispense Refill   amLODipine-olmesartan (AZOR) 10-40 MG tablet TAKE 1 TABLET BY MOUTH DAILY 90 tablet 0   Bevacizumab (AVASTIN IV) Inject 1.25 mg into the eye every 6 (six) weeks. evry 12 weeks     clopidogrel (PLAVIX) 75 MG tablet Take 1 tablet (75 mg total) by mouth daily. 90 tablet 1   labetalol (NORMODYNE) 100 MG tablet Take 1 tablet (100 mg total) by mouth 2 (two) times daily. 180  tablet 0   Multiple Vitamins-Minerals (PRESERVISION AREDS 2 PO) Take by mouth.     rosuvastatin (CRESTOR) 20 MG tablet Take 1 tablet (20 mg total) by mouth daily. 90 tablet 1   No current facility-administered medications for this visit.    Allergies  Allergen Reactions   Cozaar [Losartan] Other (See Comments)    cramps   Elemental Sulfur Diarrhea and Nausea And Vomiting   Felodipine Swelling   Lisinopril Other (See Comments)   Metoprolol Swelling   Neomycin Rash   Nickel Rash   Penicillins Rash    Family History  Problem Relation Age of Onset   Cancer Mother        colon   Emphysema Father    Heart disease Father    Heart attack Father    Cancer Sister 65       breast   Lupus Sister    Heart disease Sister    Multiple sclerosis Brother    Hypertension Sister    Cancer Daughter        breast   Cancer Paternal Grandmother        female    Social History   Socioeconomic History   Marital status: Divorced    Spouse name: Not on file   Number of children: 3   Years of education:  Not on file   Highest education level: 11th grade  Occupational History    Employer: RETIRED  Tobacco Use   Smoking status: Former    Packs/day: 2.00    Years: 64.00    Additional pack years: 0.00    Total pack years: 128.00    Types: Cigarettes    Quit date: 04/14/2014    Years since quitting: 8.7   Smokeless tobacco: Never   Tobacco comments:    smoking cessation materials not required  Vaping Use   Vaping Use: Never used  Substance and Sexual Activity   Alcohol use: Not Currently   Drug use: No   Sexual activity: Not Currently  Other Topics Concern   Not on file  Social History Narrative   Not on file   Social Determinants of Health   Financial Resource Strain: Low Risk  (01/20/2023)   Overall Financial Resource Strain (CARDIA)    Difficulty of Paying Living Expenses: Not hard at all  Food Insecurity: No Food Insecurity (01/20/2023)   Hunger Vital Sign    Worried About  Running Out of Food in the Last Year: Never true    Ran Out of Food in the Last Year: Never true  Transportation Needs: No Transportation Needs (01/20/2023)   PRAPARE - Administrator, Civil Service (Medical): No    Lack of Transportation (Non-Medical): No  Physical Activity: Insufficiently Active (01/20/2023)   Exercise Vital Sign    Days of Exercise per Week: 3 days    Minutes of Exercise per Session: 30 min  Stress: No Stress Concern Present (01/20/2023)   [employer] of Occupational Health - Occupational Stress Questionnaire    Feeling of Stress : Only a little  Social Connections: Socially Isolated (01/20/2023)   Social Connection and Isolation Panel [NHANES]    Frequency of Communication with Friends and Family: More than three times a week    Frequency of Social Gatherings with Friends and Family: Once a week    Attends Religious Services: Never    Database administrator or Organizations: No    Attends Banker Meetings: Never    Marital Status: Divorced  Catering manager Violence: Not At Risk (01/20/2023)   Humiliation, Afraid, Rape, and Kick questionnaire    Fear of Current or Ex-Partner: No    Emotionally Abused: No    Physically Abused: No    Sexually Abused: No     Constitutional: Denies fever, malaise, fatigue, headache or abrupt weight changes.  Respiratory: Denies difficulty breathing, shortness of breath, cough or sputum production.   Cardiovascular: Denies chest pain, chest tightness, palpitations or swelling in the hands or feet.  Musculoskeletal: Pt reports calf pain with ambulation, joint pain in knees. Denies decrease in range of motion, difficulty with gait, or joint swelling.  Skin: Denies redness, rashes, lesions or ulcercations.  Neurological: Pt reports intermittent dizziness. Denies difficulty with memory, difficulty with speech or problems with balance and coordination.    No other specific complaints in a complete review of  systems (except as listed in HPI above).  Objective:   Physical Exam   BP 134/82 (BP Location: Right Arm, Patient Position: Sitting, Cuff Size: Normal)   Pulse 91   Temp (!) 96.4 F (35.8 C) (Temporal)   Wt 144 lb (65.3 kg)   SpO2 95%   BMI 28.12 kg/m   Wt Readings from Last 3 Encounters:  01/20/23 147 lb (66.7 kg)  10/21/22 147 lb (66.7 kg)  08/20/22 154  lb (69.9 kg)    General: Appears her stated age,overweight in NAD. Skin: Warm, dry and intact. No ulcerations noted of the lower extremities. Cardiovascular: Normal rate and rhythm. S1,S2 noted. Murmur noted. No JVD or BLE edema. Pedal pulse 1+ bilaterally. Pulmonary/Chest: Normal effort and positive vesicular breath sounds. No respiratory distress. No wheezes, rales or ronchi noted.  Musculoskeletal: No pain with palpation of the calves. No signs of joint swelling. No difficulty with gait.  Neurological: Alert and oriented. Coordination normal.     BMET    Component Value Date/Time   NA 143 10/21/2022 1428   NA 142 09/25/2015 1135   K 4.6 10/21/2022 1428   CL 107 10/21/2022 1428   CO2 25 10/21/2022 1428   GLUCOSE 97 10/21/2022 1428   BUN 14 10/21/2022 1428   BUN 12 09/25/2015 1135   BUN 18 12/17/2014 1418   CREATININE 0.89 10/21/2022 1428   CALCIUM 9.2 10/21/2022 1428   GFRNONAA >60 02/12/2021 1125   GFRNONAA 59 (L) 12/04/2019 0933   GFRAA 68 12/04/2019 0933    Lipid Panel     Component Value Date/Time   CHOL 231 (H) 10/21/2022 1428   CHOL 218 (H) 09/25/2015 1135   TRIG 140 10/21/2022 1428   HDL 54 10/21/2022 1428   HDL 50 09/25/2015 1135   CHOLHDL 4.3 10/21/2022 1428   VLDL 22 04/18/2017 0909   LDLCALC 151 (H) 10/21/2022 1428    CBC    Component Value Date/Time   WBC 5.4 10/21/2022 1428   RBC 4.05 10/21/2022 1428   HGB 10.8 (L) 10/21/2022 1428   HGB 11.1 09/25/2015 1135   HCT 34.1 (L) 10/21/2022 1428   HCT 34.7 09/25/2015 1135   PLT 193 10/21/2022 1428   PLT 258 09/25/2015 1135   MCV 84.2  10/21/2022 1428   MCV 82 09/25/2015 1135   MCH 26.7 (L) 10/21/2022 1428   MCHC 31.7 (L) 10/21/2022 1428   RDW 14.0 10/21/2022 1428   RDW 16.8 (H) 09/25/2015 1135   LYMPHSABS 583 (L) 04/24/2018 0924   LYMPHSABS 0.8 09/25/2015 1135   MONOABS 468 12/28/2016 1043   EOSABS 21 04/24/2018 0924   EOSABS 0.1 09/25/2015 1135   BASOSABS 32 04/24/2018 0924   BASOSABS 0.0 09/25/2015 1135    Hgb A1C Lab Results  Component Value Date   HGBA1C 5.8 (H) 10/21/2022           Assessment & Plan:     RTC in 3 months for follow-up of chronic conditions Nicki Reaper, NP

## 2023-01-25 NOTE — Assessment & Plan Note (Signed)
Will obtain ABI's Referral to vascular placed

## 2023-01-25 NOTE — Patient Instructions (Signed)
Intermittent Claudication Intermittent claudication is pain in one leg or both legs that occurs when walking or exercising and goes away when resting. This condition is a symptom of peripheral vascular disease (PVD). PVD is a disease of the blood vessels. This condition is commonly treated with physical activity, medicine, and lifestyle changes. If medical management does not improve symptoms, surgery may be done to restore blood flow. This surgery is called revascularization. What are the causes?  This condition is caused by a buildup of fatty material and other substances (plaque) within the arteries (atherosclerosis). Plaque makes arteries stiff and narrow, preventing proper blood flow to the leg muscles. Pain occurs when you walk or exercise because your muscles need more blood when you are moving and exercising but cannot get it because of poor blood flow. What increases the risk? The following factors may make you more likely to develop this condition: Smoking cigarettes. A personal history of stroke or heart disease. Age. The older you are, the higher the risk. Being inactive (sedentary lifestyle) or being overweight. A family history of atherosclerosis. Having another health condition, such as: Diabetes. High blood pressure. High cholesterol. What are the signs or symptoms? Symptoms of this condition can be in one leg or both legs, and can occur in the feet, calf, thigh, hip, or buttock over time. Symptoms may include: Aches or pains when walking. Cramps. A feeling of tightness, weakness, or heaviness. A wound on the lower leg or foot that heals poorly or does not heal. How is this diagnosed? This condition may be diagnosed based on: Your symptoms. Your medical history. A physical exam. Tests, such as: Ankle-brachial index (ABI) to check blood pressure in the legs and compare it to the pressure in the arms. Exercise ankle-brachial test. For this test, you walk on a treadmill.  Tests are done to check how the condition affects your ability to walk or exercise. Arterial duplex ultrasound to view how blood flows within arteries. CT angiogram (CTA). An X-ray machine is used to take pictures of the blood vessels after dye is injected. Magnetic resonance angiogram (MRA). This creates images of blood vessels and blood flow within them. Angiogram. In this procedure, dye is injected into arteries and then X-rays are taken. Blood tests. How is this treated? Treatment for this condition involves treating the underlying cause and managing risk factors, such as high blood pressure, high cholesterol, or diabetes. Treatment may include: Lifestyle changes, such as: Starting a supervised or home-based exercise program. Losing weight. Quitting smoking. Medicines to help restore blood flow through your legs. If you have symptoms that affect your everyday activities, or have a wound that is not healing, treatment may include: Angioplasty to open a blocked artery using an inflated balloon. Stent implant to open a blocked artery using a mesh-like tube. Surgery to restore blood flow by creating a bypass around a blocked area. Follow these instructions at home: Lifestyle  Maintain a healthy weight. Eat a diet that is low in saturated fats and calories. Consider working with a dietitianto help you make healthy food choices. Do not use any products that contain nicotine or tobacco. These products include cigarettes, chewing tobacco, and vaping devices, such as e-cigarettes. If you need help quitting, ask your health care provider. If your health care provider recommended an exercise program for you, follow it as directed. Your exercise program may involve: Walking three or more times a week. Walking until you have certain symptoms of intermittent claudication, resting until your symptoms go   away, and then resuming your walk. Gradually increasing your total walking time to about 50 minutes  a day. General instructions Work with your health care provider to manage other health conditions that may increase your risk, including diabetes, high blood pressure, or high cholesterol. Take over-the-counter and prescription medicines only as told by your health care provider. Keep all follow-up visits. This is important. Contact a health care provider if: Your pain does not go away with rest. You have sores on your legs that do not heal or have pus or a bad smell. Your condition gets worse or does not improve with treatment. Get help right away if: You have chest pain. You have trouble breathing. Your foot or leg is cold or it changes color. Your foot or leg becomes numb. You have any symptoms of a stroke. "BE FAST" is an easy way to remember the main warning signs of a stroke: B - Balance. Signs are dizziness, sudden trouble walking, or loss of balance. E - Eyes. Signs are trouble seeing or a sudden change in vision. F - Face. Signs are sudden weakness or numbness of the face, or the face or eyelid drooping on one side. A - Arms. Signs are weakness or numbness in an arm. This happens suddenly and usually on one side of the body. S - Speech. Signs are sudden trouble speaking, slurred speech, or trouble understanding what people say. T - Time. Time to call emergency services. Write down what time symptoms started. You have other signs of a stroke, such as: A sudden, severe headache with no known cause. Nausea or vomiting. Seizure. These symptoms may represent a serious problem that is an emergency. Do not wait to see if the symptoms resolve. Get medical help right away. Call your local emergency services (911 in the [place]). Do not drive yourself to the hospital.  Summary Intermittent claudication is pain in the leg or legs that occurs when walking or exercising and goes away when resting. This condition is caused by a buildup of plaque in the arteries. Plaque makes arteries stiff and  narrow, which prevents proper blood flow to the leg. Intermittent claudication can be treated with medicine and lifestyle changes. If these fail, surgery may be done to restore blood flow to the affected area. Work with your health care provider to manage other health conditions you may have, including diabetes, high blood pressure, or high cholesterol. This information is not intended to replace advice given to you by your health care provider. Make sure you discuss any questions you have with your health care provider. Document Revised: 02/25/2020 Document Reviewed: 02/25/2020 [employer] Patient Education  2023 [employer]  

## 2023-02-10 ENCOUNTER — Encounter (INDEPENDENT_AMBULATORY_CARE_PROVIDER_SITE_OTHER): Payer: Self-pay | Admitting: Vascular Surgery

## 2023-02-10 DIAGNOSIS — I739 Peripheral vascular disease, unspecified: Secondary | ICD-10-CM

## 2023-02-21 ENCOUNTER — Encounter (INDEPENDENT_AMBULATORY_CARE_PROVIDER_SITE_OTHER): Payer: Self-pay | Admitting: Vascular Surgery

## 2023-02-21 ENCOUNTER — Ambulatory Visit (INDEPENDENT_AMBULATORY_CARE_PROVIDER_SITE_OTHER): Payer: Medicare Other

## 2023-02-21 ENCOUNTER — Ambulatory Visit (INDEPENDENT_AMBULATORY_CARE_PROVIDER_SITE_OTHER): Payer: Medicare Other | Admitting: Vascular Surgery

## 2023-02-21 VITALS — BP 196/70 | HR 81 | Resp 15 | Wt 145.6 lb

## 2023-02-21 DIAGNOSIS — E782 Mixed hyperlipidemia: Secondary | ICD-10-CM

## 2023-02-21 DIAGNOSIS — I708 Atherosclerosis of other arteries: Secondary | ICD-10-CM | POA: Diagnosis not present

## 2023-02-21 DIAGNOSIS — I739 Peripheral vascular disease, unspecified: Secondary | ICD-10-CM

## 2023-02-21 DIAGNOSIS — I10 Essential (primary) hypertension: Secondary | ICD-10-CM | POA: Diagnosis not present

## 2023-02-24 LAB — VAS US ABI WITH/WO TBI
Left ABI: 0.47
Right ABI: 0.57

## 2023-02-25 ENCOUNTER — Other Ambulatory Visit: Payer: Self-pay

## 2023-02-25 DIAGNOSIS — I701 Atherosclerosis of renal artery: Secondary | ICD-10-CM

## 2023-02-25 MED ORDER — ROSUVASTATIN CALCIUM 40 MG PO TABS: 40.0000 mg | ORAL_TABLET | Freq: Every day | ORAL | 1 refills | Status: DC

## 2023-02-25 MED ORDER — AMLODIPINE-OLMESARTAN 10-40 MG PO TABS: 1.0000 | ORAL_TABLET | Freq: Every day | ORAL | 1 refills | Status: DC

## 2023-02-25 MED ORDER — CLOPIDOGREL BISULFATE 75 MG PO TABS: 75.0000 mg | ORAL_TABLET | Freq: Every day | ORAL | 1 refills | Status: DC

## 2023-02-26 ENCOUNTER — Encounter (INDEPENDENT_AMBULATORY_CARE_PROVIDER_SITE_OTHER): Payer: Self-pay | Admitting: Vascular Surgery

## 2023-02-26 DIAGNOSIS — I10 Essential (primary) hypertension: Secondary | ICD-10-CM | POA: Insufficient documentation

## 2023-02-26 NOTE — Progress Notes (Signed)
MRN : [id number]  Angela Duffy is a 88 y.o. (06-20-1935) female who presents with chief complaint of check circulation.  History of Present Illness:   The patient returns to the office for followup and review of the noninvasive studies.   There have been no interval changes in lower extremity symptoms. No interval shortening of the patient's claudication distance or development of rest pain symptoms. No new ulcers or wounds have occurred since the last visit.  There have been no significant changes to the patient's overall health care.  However she does note that her blood pressure has been a bit more erratic and a bit harder to control.  The patient denies amaurosis fugax or recent TIA symptoms. There are no documented recent neurological changes noted. There is no history of DVT, PE or superficial thrombophlebitis. The patient denies recent episodes of angina or shortness of breath.   ABI Rt=0.57 and Lt=0.47    Current Meds  Medication Sig   Bevacizumab (AVASTIN IV) Inject 1.25 mg into the eye every 6 (six) weeks. evry 12 weeks   Multiple Vitamins-Minerals (PRESERVISION AREDS 2 PO) Take by mouth.   [DISCONTINUED] amLODipine-olmesartan (AZOR) 10-40 MG tablet TAKE 1 TABLET BY MOUTH DAILY   [DISCONTINUED] clopidogrel (PLAVIX) 75 MG tablet Take 1 tablet (75 mg total) by mouth daily.   [DISCONTINUED] rosuvastatin (CRESTOR) 40 MG tablet Take 1 tablet (40 mg total) by mouth daily.    Past Medical History:  Diagnosis Date   Atherosclerosis    on Chest CT 08/2014; 70-80% innominate artery, right subclavian artery, 75% celiac artery, 3 vessel coronary artery involvement   CKD (chronic kidney disease) stage 3, GFR 30-59 ml/min (HCC)    Coronary atherosclerosis    History of tobacco use    Hyperlipidemia    Hypertension    IFG (impaired fasting glucose)    Left ventricular diastolic dysfunction    Left  ventricular hypertrophy    Mitral valve regurgitation    Obesity    PVD (peripheral vascular disease) (HCC)    Renal artery atherosclerosis (HCC)    greater than 60% stenosis right renal artery   Tricuspid regurgitation    Vitamin B12 deficiency    Vitamin D deficiency disease     Past Surgical History:  Procedure Laterality Date   CAROTID SURGERY  Feb 2004   CERVICAL DISCECTOMY  1970s   ILIAC ARTERY STENT  2003   KIDNEY STENT  April 2016   LAPAROSCOPY  1970s    Social History Social History   Tobacco Use   Smoking status: Former    Packs/day: 2.00    Years: 64.00    Additional pack years: 0.00    Total pack years: 128.00    Types: Cigarettes    Quit date: 04/14/2014    Years since quitting: 8.8   Smokeless tobacco: Never   Tobacco comments:    smoking cessation materials not required  Vaping Use   Vaping Use: Never used  Substance Use Topics   Alcohol use: Not Currently   Drug use: No    Family History Family History  Problem Relation Age of Onset   Cancer Mother        colon   Emphysema Father    Heart disease Father    Heart attack Father    Cancer Sister 33       breast   Lupus Sister    Heart disease Sister    Multiple sclerosis Brother    Hypertension Sister    Cancer Daughter        breast   Cancer Paternal Grandmother        female    Allergies  Allergen Reactions   Cozaar [Losartan] Other (See Comments)    cramps   Elemental Sulfur Diarrhea and Nausea And Vomiting   Felodipine Swelling   Lisinopril Other (See Comments)   Metoprolol Swelling   Neomycin Rash   Nickel Rash   Penicillins Rash     REVIEW OF SYSTEMS (Negative unless checked)  Constitutional: [] Weight loss  [] Fever  [] Chills Cardiac: [] Chest pain   [] Chest pressure   [] Palpitations   [] Shortness of breath when laying flat   [] Shortness of breath with exertion. Vascular:  [x] Pain in legs with walking   [] Pain in legs at rest  [] History of DVT   [] Phlebitis   [] Swelling in  legs   [] Varicose veins   [] Non-healing ulcers Pulmonary:   [] Uses home oxygen   [] Productive cough   [] Hemoptysis   [] Wheeze  [] COPD   [] Asthma Neurologic:  [] Dizziness   [] Seizures   [] History of stroke   [] History of TIA  [] Aphasia   [] Vissual changes   [] Weakness or numbness in arm   [] Weakness or numbness in leg Musculoskeletal:   [] Joint swelling   [] Joint pain   [] Low back pain Hematologic:  [] Easy bruising  [] Easy bleeding   [] Hypercoagulable state   [] Anemic Gastrointestinal:  [] Diarrhea   [] Vomiting  [] Gastroesophageal reflux/heartburn   [] Difficulty swallowing. Genitourinary:  [] Chronic kidney disease   [] Difficult urination  [] Frequent urination   [] Blood in urine Skin:  [] Rashes   [] Ulcers  Psychological:  [] History of anxiety   []  History of major depression.  Physical Examination  Vitals:   02/21/23 1557  BP: (!) 196/70  Pulse: 81  Resp: 15  Weight: 145 lb 9.6 oz (66 kg)   Body mass index is 28.44 kg/m. Gen: WD/WN, NAD Head: [place]/AT, No temporalis wasting.  Ear/Nose/Throat: Hearing grossly intact, nares w/o erythema or drainage Eyes: PER, EOMI, sclera nonicteric.  Neck: Supple, no masses.  No bruit or JVD.  Pulmonary:  Good air movement, no audible wheezing, no use of accessory muscles.  Cardiac: RRR, normal S1, S2, no Murmurs. Vascular:  mild trophic changes, no open wounds Vessel Right Left  Radial Palpable Palpable  PT Not Palpable Not Palpable  DP Not Palpable Not Palpable  Gastrointestinal: soft, non-distended. No guarding/no peritoneal signs.  Musculoskeletal: M/S 5/5 throughout.  No visible deformity.  Neurologic: CN 2-12 intact. Pain and light touch intact in extremities.  Symmetrical.  Speech is fluent. Motor exam as listed above. Psychiatric: Judgment intact, Mood & affect appropriate for pt's clinical situation. Dermatologic: No rashes or ulcers noted.  No changes consistent with cellulitis.   CBC Lab Results  Component Value Date   WBC 5.4  10/21/2022   HGB 10.8 (L) 10/21/2022   HCT 34.1 (L) 10/21/2022   MCV 84.2 10/21/2022   PLT 193 10/21/2022    BMET    Component Value Date/Time   NA 143 10/21/2022 1428   NA 142 09/25/2015 1135   K  4.6 10/21/2022 1428   CL 107 10/21/2022 1428   CO2 25 10/21/2022 1428   GLUCOSE 97 10/21/2022 1428   BUN 14 10/21/2022 1428   BUN 12 09/25/2015 1135   BUN 18 12/17/2014 1418   CREATININE 0.89 10/21/2022 1428   CALCIUM 9.2 10/21/2022 1428   GFRNONAA >60 02/12/2021 1125   GFRNONAA 59 (L) 12/04/2019 0933   GFRAA 68 12/04/2019 0933   CrCl cannot be calculated (Patient's most recent lab result is older than the maximum 21 days allowed.).  COAG No results found for: "INR", "PROTIME"  Radiology VAS [place] ABI WITH/WO TBI  Result Date: 02/24/2023  LOWER EXTREMITY DOPPLER STUDY Patient Name:  JAMELL OPFER  Date of Exam:   02/21/2023 Medical Rec #: [id number]             Accession #:    [id number] Date of Birth: [birth year]             Patient Gender: F Patient Age:   72 years Exam Location:  [facility] Procedure:      VAS [place] ABI WITH/WO TBI Referring Phys: REGINA BAITY --------------------------------------------------------------------------------  Indications: Claudication, and peripheral artery disease.  Performing Technologist: Debbe Bales RVS  Examination Guidelines: A complete evaluation includes at minimum, Doppler waveform signals and systolic blood pressure reading at the level of bilateral brachial, anterior tibial, and posterior tibial arteries, when vessel segments are accessible. Bilateral testing is considered an integral part of a complete examination. Photoelectric Plethysmograph (PPG) waveforms and toe systolic pressure readings are included as required and additional duplex testing as needed. Limited examinations for reoccurring indications may be performed as noted.  ABI Findings: +---------+------------------+-----+----------+--------+ Right    Rt Pressure  (mmHg)IndexWaveform  Comment  +---------+------------------+-----+----------+--------+ Brachial 168                                       +---------+------------------+-----+----------+--------+ ATA      110               0.57 monophasic         +---------+------------------+-----+----------+--------+ PTA      0                 0.00 absent             +---------+------------------+-----+----------+--------+ PERO                            monophasic         +---------+------------------+-----+----------+--------+ Great Toe66                0.34 Abnormal           +---------+------------------+-----+----------+--------+ +---------+------------------+-----+-------------------+-------+ Left     Lt Pressure (mmHg)IndexWaveform           Comment +---------+------------------+-----+-------------------+-------+ Brachial 192                                               +---------+------------------+-----+-------------------+-------+ ATA      91                0.47 monophasic                 +---------+------------------+-----+-------------------+-------+ PTA      0  0.00 absent                     +---------+------------------+-----+-------------------+-------+ PERO                            dampened monophasic        +---------+------------------+-----+-------------------+-------+ Great Toe102               0.53 Abnormal                   +---------+------------------+-----+-------------------+-------+ +-------+-----------+-----------+------------+------------+ ABI/TBIToday's ABIToday's TBIPrevious ABIPrevious TBI +-------+-----------+-----------+------------+------------+ Right  .57        .34                                 +-------+-----------+-----------+------------+------------+ Left   .47        .53                                 +-------+-----------+-----------+------------+------------+  Summary: Right:  Resting right ankle-brachial index indicates moderate right lower extremity arterial disease. The right toe-brachial index is abnormal. Imaging and Waveforms obtained in the Right Lower Extremity from SFA to Level of the Ankle. No PTA flow seen; Monophasic Waveform seen in Vessels Imaged. Incidental Finding Rt SCA measures 59 cm/s. Left: Resting left ankle-brachial index indicates severe left lower extremity arterial disease. The left toe-brachial index is abnormal. Imaging and Waveforms obtained in the Left Lower Extremity from SFA to Level of the Ankle.No PTA flow seen; Monophasic Waveform seen in Vessels Imaged. Incidental Finding Left SCA measures 184 cm/s.  *See table(s) above for measurements and observations.   Electronically signed by Levora Dredge MD on 02/24/2023 at 3:20:51 PM.    Final      Assessment/Plan 1. PAD (peripheral artery disease) (HCC)  Recommend:  The patient has evidence of atherosclerosis of the lower extremities with claudication.  The patient does not voice lifestyle limiting changes at this point in time.  Noninvasive studies do not suggest clinically significant change.  No invasive studies, angiography or surgery at this time The patient should continue walking and begin a more formal exercise program.  The patient should continue antiplatelet therapy and aggressive treatment of the lipid abnormalities  No changes in the patient's medications at this time  Continued surveillance is indicated as atherosclerosis is likely to progress with time.    The patient will continue follow up with noninvasive studies as ordered.  - VAS [place] ABI WITH/WO TBI; Future  2. Right subclavian artery occlusion  Recommend:  The patient has evidence of atherosclerosis of the right upper extremity without claudication.  The patient does not voice lifestyle limiting changes at this point in time.  Noninvasive studies do not suggest clinically significant change.  No invasive studies,  angiography or surgery at this time The patient should continue walking and begin a more formal exercise program.  The patient should continue antiplatelet therapy and aggressive treatment of the lipid abnormalities  No changes in the patient's medications at this time  Continued surveillance is indicated as atherosclerosis is likely to progress with time.    The patient will continue follow up with noninvasive studies as ordered.   3. Essential hypertension Recommend:  Given patient's arterial disease optimal control of the patient's hypertension is important to prevent long term sequela such as CAD  and CVA.  BP is not acceptable today  The patient's vital signs suggest a significant possibility for renal artery stenosis.  Noninvasive studies are indicated at this time and a renal artery duplex will be ordered.  The patient will continue the current antihypertensive medications, no changes at this time.  The primary medical service will continue aggressive antihypertensive therapy as per the AHA guidelines  - VAS US RENAL ARTERY DUPLEX; Future  4. Mixed hyperlipidemia Continue statin as ordered and reviewed, no changes at this time    Levora Dredge, MD  02/26/2023 4:48 PM

## 2023-04-28 ENCOUNTER — Ambulatory Visit (INDEPENDENT_AMBULATORY_CARE_PROVIDER_SITE_OTHER): Payer: Medicare Other | Admitting: Internal Medicine

## 2023-04-28 ENCOUNTER — Encounter: Payer: Self-pay | Admitting: Internal Medicine

## 2023-04-28 VITALS — BP 168/68 | HR 89 | Temp 95.9°F | Wt 149.0 lb

## 2023-04-28 DIAGNOSIS — I2510 Atherosclerotic heart disease of native coronary artery without angina pectoris: Secondary | ICD-10-CM

## 2023-04-28 DIAGNOSIS — I708 Atherosclerosis of other arteries: Secondary | ICD-10-CM

## 2023-04-28 DIAGNOSIS — E663 Overweight: Secondary | ICD-10-CM | POA: Diagnosis not present

## 2023-04-28 DIAGNOSIS — R7303 Prediabetes: Secondary | ICD-10-CM | POA: Diagnosis not present

## 2023-04-28 DIAGNOSIS — I10 Essential (primary) hypertension: Secondary | ICD-10-CM

## 2023-04-28 DIAGNOSIS — I739 Peripheral vascular disease, unspecified: Secondary | ICD-10-CM | POA: Diagnosis not present

## 2023-04-28 DIAGNOSIS — I700 Atherosclerosis of aorta: Secondary | ICD-10-CM

## 2023-04-28 DIAGNOSIS — E782 Mixed hyperlipidemia: Secondary | ICD-10-CM

## 2023-04-28 DIAGNOSIS — Z6829 Body mass index (BMI) 29.0-29.9, adult: Secondary | ICD-10-CM | POA: Diagnosis not present

## 2023-04-28 DIAGNOSIS — N1831 Chronic kidney disease, stage 3a: Secondary | ICD-10-CM | POA: Diagnosis not present

## 2023-04-28 DIAGNOSIS — D631 Anemia in chronic kidney disease: Secondary | ICD-10-CM

## 2023-04-28 DIAGNOSIS — M170 Bilateral primary osteoarthritis of knee: Secondary | ICD-10-CM | POA: Diagnosis not present

## 2023-04-28 MED ORDER — AMLODIPINE-OLMESARTAN 10-40 MG PO TABS: 1.0000 | ORAL_TABLET | Freq: Every day | ORAL | 1 refills | Status: DC

## 2023-04-28 NOTE — Assessment & Plan Note (Signed)
Encourage diet and exercise for weight loss 

## 2023-04-28 NOTE — Assessment & Plan Note (Signed)
C-Met and lipid profile today Continue olmesartan for renal protection

## 2023-04-28 NOTE — Assessment & Plan Note (Signed)
Encourage weight loss as this can produce joint pain Continue Tylenol OTC as needed

## 2023-04-28 NOTE — Assessment & Plan Note (Signed)
A1c today Encourage low-carb diet and exercise for weight loss 

## 2023-04-28 NOTE — Assessment & Plan Note (Signed)
CMET and lipid profile today Encouraged her to consume a low fat diet Continue rosuvastatin and plavix

## 2023-04-28 NOTE — Patient Instructions (Signed)
Iron Deficiency Anemia, Adult  Iron deficiency anemia is a condition in which the concentration of red blood cells or hemoglobin in the blood is below normal because of too little iron. Hemoglobin is a substance in red blood cells that carries oxygen to the body's tissues. When the concentration of red blood cells or hemoglobin is too low, not enough oxygen reaches these tissues. Iron deficiency anemia is usually long-lasting, and it develops over time. It may or may not cause symptoms. It is a common type of anemia. What are the causes? This condition may be caused by: Not enough iron in the diet. Abnormal absorption in the gut. Blood loss. What increases the risk? You are more likely to develop this condition if you get menstrual periods (menstruate) or are pregnant. What are the signs or symptoms? Symptoms of this condition may include: Pale skin, lips, and nail beds. Weakness, dizziness, and getting tired easily. Shortness of breath when moving or exercising. Cold hands or feet. Mild anemia may not cause any symptoms. How is this diagnosed? This condition is diagnosed based on: Your medical history. A physical exam. Blood tests. How is this treated? This condition is treated by correcting the cause of your iron deficiency. Treatment may involve: Adding iron-rich foods to your diet. Taking iron supplements. If you are pregnant or breastfeeding, you may need to take extra iron because your normal diet usually does not provide the amount of iron that you need. Increasing vitamin C intake. Vitamin C helps your body absorb iron. Your health care provider may recommend that you take iron supplements along with a glass of orange juice or a vitamin C supplement. Medicines to make heavy menstrual flow lighter. Surgery or additional testing procedures to determine the cause of your anemia. You may need repeat blood tests to determine whether treatment is working. If the treatment does not  seem to be working, you may need more tests. Follow these instructions at home: Medicines Take over-the-counter and prescription medicines only as told by your health care provider. This includes iron supplements and vitamins. This is important because too much iron can be harmful. For the best iron absorption, you should take iron supplements when your stomach is empty. If you cannot tolerate them on an empty stomach, you may need to take them with food. Do not drink milk or take antacids at the same time as your iron supplements. Milk and antacids may interfere with how your body absorbs iron. Iron supplements may turn stool (feces) a darker color and it may appear black. If you cannot tolerate taking iron supplements by mouth, talk with your health care provider about taking them through an IV or through an injection into a muscle. Eating and drinking Talk with your health care provider before changing your diet. Your provider may recommend that you eat foods that contain a lot of iron, such as: Liver. Low-fat (lean) beef. Breads and cereals that have iron added to them (are fortified). Eggs. Dried fruit. Dark green, leafy vegetables. To help your body use the iron from iron-rich foods, eat those foods at the same time as fresh fruits and vegetables that are high in vitamin C. Foods that are high in vitamin C include: Oranges. Peppers. Tomatoes. Mangoes. Managing constipation If you are taking an iron supplement, it may cause constipation. To prevent or treat constipation, you may need to: Drink enough fluid to keep your urine pale yellow. Take over-the-counter or prescription medicines. Eat foods that are high in fiber, such   as beans, whole grains, and fresh fruits and vegetables. Limit foods that are high in fat and processed sugars, such as fried or sweet foods. General instructions Return to your normal activities as told by your health care provider. Ask your health care provider  what activities are safe for you. Keep all follow-up visits. Contact a health care provider if: You feel nauseous or you vomit. You feel weak. You become light-headed when getting up from a sitting or lying down position. You have unexplained sweating. You develop symptoms of constipation. You have a heaviness in your chest. You have trouble breathing with physical activity. Get help right away if: You faint. If this happens, do not drive yourself to the hospital. You have an irregular or rapid heartbeat. Summary Iron deficiency anemia is a condition in which the concentration of red blood cells or hemoglobin in the blood is below normal because of too little iron. This condition is treated by correcting the cause of your iron deficiency. Take over-the-counter and prescription medicines only as told by your health care provider. This includes iron supplements and vitamins. To help your body use the iron from iron-rich foods, eat those foods at the same time as fresh fruits and vegetables that are high in vitamin C. Seek medical help if you have signs or symptoms of worsening anemia. This information is not intended to replace advice given to you by your health care provider. Make sure you discuss any questions you have with your health care provider. Document Revised: 09/30/2021 Document Reviewed: 09/30/2021 Elsevier Patient Education  2024 [employer]  

## 2023-04-28 NOTE — Assessment & Plan Note (Signed)
CBC and iron panel today 

## 2023-04-28 NOTE — Progress Notes (Signed)
Subjective:    Patient ID: Angela Duffy, female    DOB: 04/21/1935, 88 y.o.   MRN: [id number]  HPI  Pt presents to the clinic today for 6 month follow up chronic conditions.  CKD 3: Her last creatinine was 0.89, GFR 63, 10/2022. She is on olmesartan for renal protection. She does not follow with nephrology.  HLD with PVD, CAD/aortic atherosclerosis: Her last LDL was 151, triglycerides 140, 10/2022. She denies myalgias on rosuvastatin. She is taking plavix as well. She does not consume a low fat diet. She follows with vascular.  HTN: her BP today is 156/72. She does have subclavian artery stenosis. She is taking amlodipine-olmesartan as prescribed, but she reports she has been out for 1 week. ECG from 02/2021 reviewed.  Prediabetes: Her last A1C was 5.8%, 10/2022. She is not taking any oral diabetic medication at this time. She does not check her sugars.  OA: Mainly in her knees. She takes tylenol as needed with some relief of symptoms. She does not follow with orthopedics.  Anemia: Her last H/H was 10.1/34.1, 10/2022. She is not taking any oral iron at this time. She does not follow with hematology.  Review of Systems  Past Medical History:  Diagnosis Date   Atherosclerosis    on Chest CT 08/2014; 70-80% innominate artery, right subclavian artery, 75% celiac artery, 3 vessel coronary artery involvement   CKD (chronic kidney disease) stage 3, GFR 30-59 ml/min (HCC)    Coronary atherosclerosis    History of tobacco use    Hyperlipidemia    Hypertension    IFG (impaired fasting glucose)    Left ventricular diastolic dysfunction    Left ventricular hypertrophy    Mitral valve regurgitation    Obesity    PVD (peripheral vascular disease) (HCC)    Renal artery atherosclerosis (HCC)    greater than 60% stenosis right renal artery   Tricuspid regurgitation    Vitamin B12 deficiency    Vitamin D deficiency disease     Current Outpatient Medications  Medication Sig Dispense  Refill   amLODipine-olmesartan (AZOR) 10-40 MG tablet Take 1 tablet by mouth daily. 90 tablet 1   Bevacizumab (AVASTIN IV) Inject 1.25 mg into the eye every 6 (six) weeks. evry 12 weeks     clopidogrel (PLAVIX) 75 MG tablet Take 1 tablet (75 mg total) by mouth daily. 90 tablet 1   Multiple Vitamins-Minerals (PRESERVISION AREDS 2 PO) Take by mouth.     rosuvastatin (CRESTOR) 40 MG tablet Take 1 tablet (40 mg total) by mouth daily. 90 tablet 1   No current facility-administered medications for this visit.    Allergies  Allergen Reactions   Cozaar [Losartan] Other (See Comments)    cramps   Elemental Sulfur Diarrhea and Nausea And Vomiting   Felodipine Swelling   Lisinopril Other (See Comments)   Metoprolol Swelling   Neomycin Rash   Nickel Rash   Penicillins Rash    Family History  Problem Relation Age of Onset   Cancer Mother        colon   Emphysema Father    Heart disease Father    Heart attack Father    Cancer Sister 76       breast   Lupus Sister    Heart disease Sister    Multiple sclerosis Brother    Hypertension Sister    Cancer Daughter        breast   Cancer Paternal Grandmother  female    Social History   Socioeconomic History   Marital status: Divorced    Spouse name: Not on file   Number of children: 3   Years of education: Not on file   Highest education level: 11th grade  Occupational History    Employer: RETIRED  Tobacco Use   Smoking status: Former    Current packs/day: 0.00    Average packs/day: 2.0 packs/day for 64.0 years (128.0 ttl pk-yrs)    Types: Cigarettes    Start date: 04/14/1950    Quit date: 04/14/2014    Years since quitting: 9.0   Smokeless tobacco: Never   Tobacco comments:    smoking cessation materials not required  Vaping Use   Vaping status: Never Used  Substance and Sexual Activity   Alcohol use: Not Currently   Drug use: No   Sexual activity: Not Currently  Other Topics Concern   Not on file  Social History  Narrative   Not on file   Social Determinants of Health   Financial Resource Strain: Low Risk  (01/20/2023)   Overall Financial Resource Strain (CARDIA)    Difficulty of Paying Living Expenses: Not hard at all  Food Insecurity: No Food Insecurity (01/20/2023)   Hunger Vital Sign    Worried About Running Out of Food in the Last Year: Never true    Ran Out of Food in the Last Year: Never true  Transportation Needs: No Transportation Needs (01/20/2023)   PRAPARE - Administrator, Civil Service (Medical): No    Lack of Transportation (Non-Medical): No  Physical Activity: Insufficiently Active (01/20/2023)   Exercise Vital Sign    Days of Exercise per Week: 3 days    Minutes of Exercise per Session: 30 min  Stress: No Stress Concern Present (01/20/2023)   [employer] of Occupational Health - Occupational Stress Questionnaire    Feeling of Stress : Only a little  Social Connections: Socially Isolated (01/20/2023)   Social Connection and Isolation Panel [NHANES]    Frequency of Communication with Friends and Family: More than three times a week    Frequency of Social Gatherings with Friends and Family: Once a week    Attends Religious Services: Never    Database administrator or Organizations: No    Attends Banker Meetings: Never    Marital Status: Divorced  Catering manager Violence: Not At Risk (01/20/2023)   Humiliation, Afraid, Rape, and Kick questionnaire    Fear of Current or Ex-Partner: No    Emotionally Abused: No    Physically Abused: No    Sexually Abused: No     Constitutional: Denies fever, malaise, fatigue, headache or abrupt weight changes.  HEENT: Denies eye pain, eye redness, ear pain, ringing in the ears, wax buildup, runny nose, nasal congestion, bloody nose, or sore throat. Respiratory: Denies difficulty breathing, shortness of breath, cough or sputum production.   Cardiovascular: Pt reports swelling in legs. Denies chest pain, chest  tightness, palpitations or swelling in the hands.  Gastrointestinal: Denies abdominal pain, bloating, constipation, diarrhea or blood in the stool.  GU: Denies urgency, frequency, pain with urination, burning sensation, blood in urine, odor or discharge. Musculoskeletal: Pt reports knee pain. Denies decrease in range of motion, difficulty with gait, muscle pain or joint swelling.  Skin: Denies redness, rashes, lesions or ulcercations.  Neurological: Denies dizziness, difficulty with memory, difficulty with speech or problems with balance and coordination.  Psych: Denies anxiety, depression, SI/HI.  No other  specific complaints in a complete review of systems (except as listed in HPI above).     Objective:   Physical Exam  BP (!) 168/68 (BP Location: Left Arm, Patient Position: Sitting, Cuff Size: Normal)   Pulse 89   Temp (!) 95.9 F (35.5 C) (Temporal)   Wt 149 lb (67.6 kg)   SpO2 96%   BMI 29.10 kg/m   Wt Readings from Last 3 Encounters:  02/21/23 145 lb 9.6 oz (66 kg)  01/25/23 144 lb (65.3 kg)  01/20/23 147 lb (66.7 kg)    General: Appears her stated age, overweight, in NAD. Skin: Warm, dry and intact.  HEENT: Head: normal shape and size; Eyes: sclera white, no icterus, conjunctiva pink, PERRLA and EOMs intact;  Cardiovascular: Normal rate and rhythm. S1,S2 noted.  Murmur noted. No JVD. Trace pitting BLE edema. Left carotid bruit noted. Pulmonary/Chest: Normal effort and positive vesicular breath sounds. No respiratory distress. No wheezes, rales or ronchi noted.  Abdomen: Normal bowel sounds.  Musculoskeletal: Joint enlargement noted in knees. No difficulty with gait.  Neurological: Alert and oriented. Coordination normal.  Psychiatric: Mood and affect normal. Behavior is normal. Judgment and thought content normal.     BMET    Component Value Date/Time   NA 143 10/21/2022 1428   NA 142 09/25/2015 1135   K 4.6 10/21/2022 1428   CL 107 10/21/2022 1428   CO2 25  10/21/2022 1428   GLUCOSE 97 10/21/2022 1428   BUN 14 10/21/2022 1428   BUN 12 09/25/2015 1135   BUN 18 12/17/2014 1418   CREATININE 0.89 10/21/2022 1428   CALCIUM 9.2 10/21/2022 1428   GFRNONAA >60 02/12/2021 1125   GFRNONAA 59 (L) 12/04/2019 0933   GFRAA 68 12/04/2019 0933    Lipid Panel     Component Value Date/Time   CHOL 231 (H) 10/21/2022 1428   CHOL 218 (H) 09/25/2015 1135   TRIG 140 10/21/2022 1428   HDL 54 10/21/2022 1428   HDL 50 09/25/2015 1135   CHOLHDL 4.3 10/21/2022 1428   VLDL 22 04/18/2017 0909   LDLCALC 151 (H) 10/21/2022 1428    CBC    Component Value Date/Time   WBC 5.4 10/21/2022 1428   RBC 4.05 10/21/2022 1428   HGB 10.8 (L) 10/21/2022 1428   HGB 11.1 09/25/2015 1135   HCT 34.1 (L) 10/21/2022 1428   HCT 34.7 09/25/2015 1135   PLT 193 10/21/2022 1428   PLT 258 09/25/2015 1135   MCV 84.2 10/21/2022 1428   MCV 82 09/25/2015 1135   MCH 26.7 (L) 10/21/2022 1428   MCHC 31.7 (L) 10/21/2022 1428   RDW 14.0 10/21/2022 1428   RDW 16.8 (H) 09/25/2015 1135   LYMPHSABS 583 (L) 04/24/2018 0924   LYMPHSABS 0.8 09/25/2015 1135   MONOABS 468 12/28/2016 1043   EOSABS 21 04/24/2018 0924   EOSABS 0.1 09/25/2015 1135   BASOSABS 32 04/24/2018 0924   BASOSABS 0.0 09/25/2015 1135    Hgb A1C Lab Results  Component Value Date   HGBA1C 5.8 (H) 10/21/2022           Assessment & Plan:    RTC in 6 months, follow up chronic conditions Nicki Reaper, NP

## 2023-04-28 NOTE — Assessment & Plan Note (Signed)
Elevated today but better than it has been  Continue amlodipine-olmesartan, refilled today Reinforced DASH diet and exercise for weight loss C-Met today

## 2023-04-29 LAB — COMPLETE METABOLIC PANEL WITH GFR
AG Ratio: 1.8 (calc) (ref 1.0–2.5)
ALT: 10 U/L (ref 6–29)
AST: 19 U/L (ref 10–35)
Albumin: 4.2 g/dL (ref 3.6–5.1)
Alkaline phosphatase (APISO): 55 U/L (ref 37–153)
BUN/Creatinine Ratio: 14 (calc) (ref 6–22)
BUN: 14 mg/dL (ref 7–25)
CO2: 29 mmol/L (ref 20–32)
Calcium: 9.5 mg/dL (ref 8.6–10.4)
Chloride: 105 mmol/L (ref 98–110)
Creat: 1.02 mg/dL — ABNORMAL HIGH (ref 0.60–0.95)
Globulin: 2.3 g/dL (ref 1.9–3.7)
Glucose, Bld: 134 mg/dL (ref 65–139)
Potassium: 4.2 mmol/L (ref 3.5–5.3)
Sodium: 143 mmol/L (ref 135–146)
Total Bilirubin: 0.6 mg/dL (ref 0.2–1.2)
Total Protein: 6.5 g/dL (ref 6.1–8.1)
eGFR: 54 mL/min/{1.73_m2} — ABNORMAL LOW (ref >=60)

## 2023-04-29 LAB — CBC WITH DIFFERENTIAL/PLATELET
Absolute Monocytes: 450 cells/uL (ref 200–950)
Basophils Absolute: 30 {cells}/uL (ref 0–200)
Basophils Relative: 0.6 %
Eosinophils Absolute: 20 cells/uL (ref 15–500)
Eosinophils Relative: 0.4 %
HCT: 31 % — ABNORMAL LOW (ref 35.0–45.0)
Hemoglobin: 9.6 g/dL — ABNORMAL LOW (ref 11.7–15.5)
Lymphs Abs: 845 {cells}/uL — ABNORMAL LOW (ref 850–3900)
MCH: 24.7 pg — ABNORMAL LOW (ref 27.0–33.0)
MCHC: 31 g/dL — ABNORMAL LOW (ref 32.0–36.0)
MCV: 79.7 fL — ABNORMAL LOW (ref 80.0–100.0)
MPV: 11.6 fL (ref 7.5–12.5)
Monocytes Relative: 9 %
Neutro Abs: 3655 {cells}/uL (ref 1500–7800)
Neutrophils Relative %: 73.1 %
Platelets: 203 10*3/uL (ref 140–400)
RBC: 3.89 10*6/uL (ref 3.80–5.10)
RDW: 14.8 % (ref 11.0–15.0)
Total Lymphocyte: 16.9 %
WBC: 5 10*3/uL (ref 3.8–10.8)

## 2023-04-29 LAB — LIPID PANEL
Cholesterol: 139 mg/dL (ref <200)
HDL: 50 mg/dL (ref >=50)
LDL Cholesterol (Calc): 68 mg/dL
Non-HDL Cholesterol (Calc): 89 mg/dL (ref <130)
Total CHOL/HDL Ratio: 2.8 (calc) (ref <5.0)
Triglycerides: 120 mg/dL (ref <150)

## 2023-04-29 LAB — HEMOGLOBIN A1C
Hgb A1c MFr Bld: 5.7 %{Hb} — ABNORMAL HIGH (ref <5.7)
Mean Plasma Glucose: 117 mg/dL
eAG (mmol/L): 6.5 mmol/L

## 2023-04-29 LAB — IRON,TIBC AND FERRITIN PANEL
%SAT: 7 % — ABNORMAL LOW (ref 16–45)
Ferritin: 7 ng/mL — ABNORMAL LOW (ref 16–288)
Iron: 29 ug/dL — ABNORMAL LOW (ref 45–160)
TIBC: 407 mcg/dL (calc) (ref 250–450)

## 2023-05-13 ENCOUNTER — Encounter (INDEPENDENT_AMBULATORY_CARE_PROVIDER_SITE_OTHER): Payer: Medicare Other | Admitting: Ophthalmology

## 2023-05-13 DIAGNOSIS — I10 Essential (primary) hypertension: Secondary | ICD-10-CM

## 2023-05-13 DIAGNOSIS — H43813 Vitreous degeneration, bilateral: Secondary | ICD-10-CM | POA: Diagnosis not present

## 2023-05-13 DIAGNOSIS — H35033 Hypertensive retinopathy, bilateral: Secondary | ICD-10-CM

## 2023-05-13 DIAGNOSIS — H353231 Exudative age-related macular degeneration, bilateral, with active choroidal neovascularization: Secondary | ICD-10-CM

## 2023-05-13 DIAGNOSIS — D3131 Benign neoplasm of right choroid: Secondary | ICD-10-CM

## 2023-05-26 ENCOUNTER — Ambulatory Visit (INDEPENDENT_AMBULATORY_CARE_PROVIDER_SITE_OTHER): Payer: Medicare Other | Admitting: Vascular Surgery

## 2023-05-26 ENCOUNTER — Ambulatory Visit (INDEPENDENT_AMBULATORY_CARE_PROVIDER_SITE_OTHER): Payer: Medicare Other

## 2023-05-26 DIAGNOSIS — I739 Peripheral vascular disease, unspecified: Secondary | ICD-10-CM | POA: Diagnosis not present

## 2023-05-26 DIAGNOSIS — I10 Essential (primary) hypertension: Secondary | ICD-10-CM | POA: Diagnosis not present

## 2023-06-09 DIAGNOSIS — H40013 Open angle with borderline findings, low risk, bilateral: Secondary | ICD-10-CM | POA: Diagnosis not present

## 2023-06-09 DIAGNOSIS — H353131 Nonexudative age-related macular degeneration, bilateral, early dry stage: Secondary | ICD-10-CM | POA: Diagnosis not present

## 2023-06-09 DIAGNOSIS — H1045 Other chronic allergic conjunctivitis: Secondary | ICD-10-CM | POA: Diagnosis not present

## 2023-06-23 LAB — VAS US ABI WITH/WO TBI
Left ABI: 0.75
Right ABI: 0.68

## 2023-07-01 ENCOUNTER — Encounter (INDEPENDENT_AMBULATORY_CARE_PROVIDER_SITE_OTHER): Payer: Medicare Other | Admitting: Ophthalmology

## 2023-07-01 DIAGNOSIS — H43813 Vitreous degeneration, bilateral: Secondary | ICD-10-CM

## 2023-07-01 DIAGNOSIS — I10 Essential (primary) hypertension: Secondary | ICD-10-CM | POA: Diagnosis not present

## 2023-07-01 DIAGNOSIS — H353231 Exudative age-related macular degeneration, bilateral, with active choroidal neovascularization: Secondary | ICD-10-CM | POA: Diagnosis not present

## 2023-07-01 DIAGNOSIS — H35033 Hypertensive retinopathy, bilateral: Secondary | ICD-10-CM | POA: Diagnosis not present

## 2023-08-19 ENCOUNTER — Encounter (INDEPENDENT_AMBULATORY_CARE_PROVIDER_SITE_OTHER): Payer: Medicare Other | Admitting: Ophthalmology

## 2023-08-19 DIAGNOSIS — I10 Essential (primary) hypertension: Secondary | ICD-10-CM

## 2023-08-19 DIAGNOSIS — D3131 Benign neoplasm of right choroid: Secondary | ICD-10-CM

## 2023-08-19 DIAGNOSIS — H353231 Exudative age-related macular degeneration, bilateral, with active choroidal neovascularization: Secondary | ICD-10-CM | POA: Diagnosis not present

## 2023-08-19 DIAGNOSIS — H35033 Hypertensive retinopathy, bilateral: Secondary | ICD-10-CM

## 2023-08-19 DIAGNOSIS — H43813 Vitreous degeneration, bilateral: Secondary | ICD-10-CM | POA: Diagnosis not present

## 2023-09-26 ENCOUNTER — Other Ambulatory Visit (INDEPENDENT_AMBULATORY_CARE_PROVIDER_SITE_OTHER): Payer: Self-pay | Admitting: Nurse Practitioner

## 2023-09-26 DIAGNOSIS — I739 Peripheral vascular disease, unspecified: Secondary | ICD-10-CM

## 2023-09-29 ENCOUNTER — Ambulatory Visit (INDEPENDENT_AMBULATORY_CARE_PROVIDER_SITE_OTHER): Payer: Medicare Other | Admitting: Vascular Surgery

## 2023-09-29 ENCOUNTER — Encounter (INDEPENDENT_AMBULATORY_CARE_PROVIDER_SITE_OTHER): Payer: Medicare Other

## 2023-10-07 ENCOUNTER — Encounter (INDEPENDENT_AMBULATORY_CARE_PROVIDER_SITE_OTHER): Payer: Medicare Other | Admitting: Ophthalmology

## 2023-10-07 DIAGNOSIS — I10 Essential (primary) hypertension: Secondary | ICD-10-CM | POA: Diagnosis not present

## 2023-10-07 DIAGNOSIS — D3131 Benign neoplasm of right choroid: Secondary | ICD-10-CM

## 2023-10-07 DIAGNOSIS — H353231 Exudative age-related macular degeneration, bilateral, with active choroidal neovascularization: Secondary | ICD-10-CM

## 2023-10-07 DIAGNOSIS — H43813 Vitreous degeneration, bilateral: Secondary | ICD-10-CM | POA: Diagnosis not present

## 2023-10-07 DIAGNOSIS — H35033 Hypertensive retinopathy, bilateral: Secondary | ICD-10-CM | POA: Diagnosis not present

## 2023-10-24 DIAGNOSIS — Z23 Encounter for immunization: Secondary | ICD-10-CM | POA: Diagnosis not present

## 2023-10-31 ENCOUNTER — Ambulatory Visit: Payer: Self-pay | Admitting: Internal Medicine

## 2023-11-06 NOTE — Progress Notes (Unsigned)
 MRN : [id number]  Angela Duffy is a 89 y.o. (09-Feb-1935) female who presents with chief complaint of check circulation.  History of Present Illness:   The patient returns to the office for followup and review of the noninvasive studies.    There have been no interval changes in lower extremity symptoms. No interval shortening of the patient's claudication distance or development of rest pain symptoms. No new ulcers or wounds have occurred since the last visit.   There have been no significant changes to the patient's overall health care.  However she does note that her blood pressure has been a bit more erratic and a bit harder to control.   The patient denies amaurosis fugax or recent TIA symptoms. There are no documented recent neurological changes noted. There is no history of DVT, PE or superficial thrombophlebitis. The patient denies recent episodes of angina or shortness of breath.    ABI Rt=0.69 and Lt=0.73  (previous ABI's ABI Rt=0.68 and Lt=0.75 )  No outpatient medications have been marked as taking for the 11/07/23 encounter (Appointment) with Gilda Crease, Latina Craver, MD.    Past Medical History:  Diagnosis Date   Atherosclerosis    on Chest CT 08/2014; 70-80% innominate artery, right subclavian artery, 75% celiac artery, 3 vessel coronary artery involvement   CKD (chronic kidney disease) stage 3, GFR 30-59 ml/min (HCC)    Coronary atherosclerosis    History of tobacco use    Hyperlipidemia    Hypertension    IFG (impaired fasting glucose)    Left ventricular diastolic dysfunction    Left ventricular hypertrophy    Mitral valve regurgitation    Obesity    PVD (peripheral vascular disease) (HCC)    Renal artery atherosclerosis (HCC)    greater than 60% stenosis right renal artery   Tricuspid regurgitation    Vitamin B12 deficiency    Vitamin D deficiency disease     Past Surgical History:   Procedure Laterality Date   CAROTID SURGERY  Feb 2004   CERVICAL DISCECTOMY  1970s   ILIAC ARTERY STENT  2003   KIDNEY STENT  April 2016   LAPAROSCOPY  1970s    Social History Social History   Tobacco Use   Smoking status: Former    Current packs/day: 0.00    Average packs/day: 2.0 packs/day for 64.0 years (128.0 ttl pk-yrs)    Types: Cigarettes    Start date: 04/14/1950    Quit date: 04/14/2014    Years since quitting: 9.5   Smokeless tobacco: Never   Tobacco comments:    smoking cessation materials not required  Vaping Use   Vaping status: Never Used  Substance Use Topics   Alcohol use: Not Currently   Drug use: No    Family History Family History  Problem Relation Age of Onset   Cancer Mother        colon   Emphysema Father    Heart disease Father    Heart attack Father    Cancer Sister 72       breast   Lupus Sister  Heart disease Sister    Multiple sclerosis Brother    Hypertension Sister    Cancer Daughter        breast   Cancer Paternal Grandmother        female    Allergies  Allergen Reactions   Cozaar [Losartan] Other (See Comments)    cramps   Elemental Sulfur Diarrhea and Nausea And Vomiting   Felodipine Swelling   Lisinopril Other (See Comments)   Metoprolol Swelling   Neomycin Rash   Nickel Rash   Penicillins Rash     REVIEW OF SYSTEMS (Negative unless checked)  Constitutional: [] Weight loss  [] Fever  [] Chills Cardiac: [] Chest pain   [] Chest pressure   [] Palpitations   [] Shortness of breath when laying flat   [] Shortness of breath with exertion. Vascular:  [x] Pain in legs with walking   [] Pain in legs at rest  [] History of DVT   [] Phlebitis   [] Swelling in legs   [] Varicose veins   [] Non-healing ulcers Pulmonary:   [] Uses home oxygen   [] Productive cough   [] Hemoptysis   [] Wheeze  [] COPD   [] Asthma Neurologic:  [] Dizziness   [] Seizures   [] History of stroke   [] History of TIA  [] Aphasia   [] Vissual changes   [] Weakness or numbness in  arm   [] Weakness or numbness in leg Musculoskeletal:   [] Joint swelling   [] Joint pain   [] Low back pain Hematologic:  [] Easy bruising  [] Easy bleeding   [] Hypercoagulable state   [] Anemic Gastrointestinal:  [] Diarrhea   [] Vomiting  [] Gastroesophageal reflux/heartburn   [] Difficulty swallowing. Genitourinary:  [] Chronic kidney disease   [] Difficult urination  [] Frequent urination   [] Blood in urine Skin:  [] Rashes   [] Ulcers  Psychological:  [] History of anxiety   []  History of major depression.  Physical Examination  There were no vitals filed for this visit. There is no height or weight on file to calculate BMI. Gen: WD/WN, NAD Head: [place]/AT, No temporalis wasting.  Ear/Nose/Throat: Hearing grossly intact, nares w/o erythema or drainage Eyes: PER, EOMI, sclera nonicteric.  Neck: Supple, no masses.  No bruit or JVD.  Pulmonary:  Good air movement, no audible wheezing, no use of accessory muscles.  Cardiac: RRR, normal S1, S2, no Murmurs. Vascular:  mild trophic changes, no open wounds Vessel Right Left  Radial Palpable Palpable  PT Not Palpable Not Palpable  DP Not Palpable Not Palpable  Gastrointestinal: soft, non-distended. No guarding/no peritoneal signs.  Musculoskeletal: M/S 5/5 throughout.  No visible deformity.  Neurologic: CN 2-12 intact. Pain and light touch intact in extremities.  Symmetrical.  Speech is fluent. Motor exam as listed above. Psychiatric: Judgment intact, Mood & affect appropriate for pt's clinical situation. Dermatologic: No rashes or ulcers noted.  No changes consistent with cellulitis.   CBC Lab Results  Component Value Date   WBC 5.0 04/28/2023   HGB 9.6 (L) 04/28/2023   HCT 31.0 (L) 04/28/2023   MCV 79.7 (L) 04/28/2023   PLT 203 04/28/2023    BMET    Component Value Date/Time   NA 143 04/28/2023 1329   NA 142 09/25/2015 1135   K 4.2 04/28/2023 1329   CL 105 04/28/2023 1329   CO2 29 04/28/2023 1329   GLUCOSE 134 04/28/2023 1329   BUN 14  04/28/2023 1329   BUN 12 09/25/2015 1135   BUN 18 12/17/2014 1418   CREATININE 1.02 (H) 04/28/2023 1329   CALCIUM 9.5 04/28/2023 1329   GFRNONAA >60 02/12/2021 1125   GFRNONAA 59 (L) 12/04/2019  0933   GFRAA 68 12/04/2019 0933   CrCl cannot be calculated (Patient's most recent lab result is older than the maximum 21 days allowed.).  COAG No results found for: "INR", "PROTIME"  Radiology No results found.   Assessment/Plan 1. PAD (peripheral artery disease) (HCC) (Primary)  Recommend:  The patient has evidence of atherosclerosis of the lower extremities with claudication.  The patient does not voice lifestyle limiting changes at this point in time.  Noninvasive studies do not suggest clinically significant change.  No invasive studies, angiography or surgery at this time The patient should continue walking and begin a more formal exercise program.  The patient should continue antiplatelet therapy and aggressive treatment of the lipid abnormalities  No changes in the patient's medications at this time  Continued surveillance is indicated as atherosclerosis is likely to progress with time.    The patient will continue follow up with noninvasive studies as ordered.  - VAS [place] ABI WITH/WO TBI; Future  2. Benign essential hypertension Continue antihypertensive medications as already ordered, these medications have been reviewed and there are no changes at this time.  3. Atherosclerosis of native coronary artery of native heart without angina pectoris Continue cardiac and antihypertensive medications as already ordered and reviewed, no changes at this time.  Continue statin as ordered and reviewed, no changes at this time  Nitrates PRN for chest pain  4. Right subclavian artery occlusion Recommend:   The patient has a history of atherosclerosis of the right upper extremity without claudication.  The patient does not voice lifestyle limiting changes at this point in time.   This appears to remain asymptomatic.   Noninvasive studies do not suggest clinically significant change.  In fact, the ABI performed today shows her systolic blood pressures are equal in both the right and left arms.   No invasive studies, angiography or surgery at this time The patient should continue walking and begin a more formal exercise program.  The patient should continue antiplatelet therapy and aggressive treatment of the lipid abnormalities   No changes in the patient's medications at this time   Continued surveillance is indicated as atherosclerosis is likely to progress with time.     The patient will continue follow up with noninvasive studies as ordered.   5. Mixed hyperlipidemia Continue statin as ordered and reviewed, no changes at this time    Levora Dredge, MD  11/06/2023 1:52 PM

## 2023-11-07 ENCOUNTER — Ambulatory Visit (INDEPENDENT_AMBULATORY_CARE_PROVIDER_SITE_OTHER): Payer: Medicare Other

## 2023-11-07 ENCOUNTER — Encounter (INDEPENDENT_AMBULATORY_CARE_PROVIDER_SITE_OTHER): Payer: Self-pay | Admitting: Vascular Surgery

## 2023-11-07 ENCOUNTER — Ambulatory Visit (INDEPENDENT_AMBULATORY_CARE_PROVIDER_SITE_OTHER): Payer: Medicare Other | Admitting: Vascular Surgery

## 2023-11-07 VITALS — BP 130/61 | HR 66 | Resp 16 | Wt 143.2 lb

## 2023-11-07 DIAGNOSIS — I739 Peripheral vascular disease, unspecified: Secondary | ICD-10-CM

## 2023-11-07 DIAGNOSIS — I708 Atherosclerosis of other arteries: Secondary | ICD-10-CM

## 2023-11-07 DIAGNOSIS — I10 Essential (primary) hypertension: Secondary | ICD-10-CM

## 2023-11-07 DIAGNOSIS — I2510 Atherosclerotic heart disease of native coronary artery without angina pectoris: Secondary | ICD-10-CM

## 2023-11-07 DIAGNOSIS — E782 Mixed hyperlipidemia: Secondary | ICD-10-CM

## 2023-11-07 LAB — VAS US ABI WITH/WO TBI
Left ABI: 0.73
Right ABI: 0.69

## 2023-11-09 ENCOUNTER — Encounter (INDEPENDENT_AMBULATORY_CARE_PROVIDER_SITE_OTHER): Payer: Self-pay | Admitting: Vascular Surgery

## 2023-11-15 ENCOUNTER — Ambulatory Visit (INDEPENDENT_AMBULATORY_CARE_PROVIDER_SITE_OTHER): Payer: Medicare Other | Admitting: Internal Medicine

## 2023-11-15 ENCOUNTER — Encounter: Payer: Self-pay | Admitting: Internal Medicine

## 2023-11-15 VITALS — BP 138/64 | Ht 60.0 in | Wt 145.2 lb

## 2023-11-15 DIAGNOSIS — E663 Overweight: Secondary | ICD-10-CM

## 2023-11-15 DIAGNOSIS — I701 Atherosclerosis of renal artery: Secondary | ICD-10-CM

## 2023-11-15 DIAGNOSIS — E782 Mixed hyperlipidemia: Secondary | ICD-10-CM | POA: Diagnosis not present

## 2023-11-15 DIAGNOSIS — I2510 Atherosclerotic heart disease of native coronary artery without angina pectoris: Secondary | ICD-10-CM

## 2023-11-15 DIAGNOSIS — M170 Bilateral primary osteoarthritis of knee: Secondary | ICD-10-CM

## 2023-11-15 DIAGNOSIS — I700 Atherosclerosis of aorta: Secondary | ICD-10-CM

## 2023-11-15 DIAGNOSIS — I10 Essential (primary) hypertension: Secondary | ICD-10-CM

## 2023-11-15 DIAGNOSIS — R7303 Prediabetes: Secondary | ICD-10-CM | POA: Diagnosis not present

## 2023-11-15 DIAGNOSIS — I739 Peripheral vascular disease, unspecified: Secondary | ICD-10-CM

## 2023-11-15 DIAGNOSIS — I708 Atherosclerosis of other arteries: Secondary | ICD-10-CM | POA: Diagnosis not present

## 2023-11-15 DIAGNOSIS — Z6828 Body mass index (BMI) 28.0-28.9, adult: Secondary | ICD-10-CM

## 2023-11-15 DIAGNOSIS — N1831 Chronic kidney disease, stage 3a: Secondary | ICD-10-CM | POA: Diagnosis not present

## 2023-11-15 DIAGNOSIS — D631 Anemia in chronic kidney disease: Secondary | ICD-10-CM | POA: Diagnosis not present

## 2023-11-15 MED ORDER — CLOPIDOGREL BISULFATE 75 MG PO TABS: 75.0000 mg | ORAL_TABLET | Freq: Every day | ORAL | 1 refills | Status: AC

## 2023-11-15 MED ORDER — ROSUVASTATIN CALCIUM 40 MG PO TABS: 40.0000 mg | ORAL_TABLET | Freq: Every day | ORAL | 1 refills | Status: AC

## 2023-11-15 MED ORDER — AMLODIPINE-OLMESARTAN 10-40 MG PO TABS: 1.0000 | ORAL_TABLET | Freq: Every day | ORAL | 1 refills | Status: DC

## 2023-11-15 NOTE — Assessment & Plan Note (Signed)
C-Met and lipid profile today Continue olmesartan for renal protection

## 2023-11-15 NOTE — Progress Notes (Signed)
 Subjective:    Patient ID: Angela Duffy, female    DOB: [birth year], 89 y.o.   MRN: [id number]  HPI  Pt presents to the clinic today for 6 month follow up chronic conditions.  CKD 3: Her last creatinine was 1.02, GFR 54, 04/2023. She is on olmesartan for renal protection. She does not follow with nephrology.  HLD with pad, cad/aortic atherosclerosis: Her last LDL was 68, triglycerides 782, 04/2023. She denies myalgias on rosuvastatin. She is taking plavix as well. She does not consume a low fat diet. She follows with vascular.  HTN: her BP today is 138/64. She does have subclavian artery stenosis. She is taking amlodipine-olmesartan as prescribed. ECG from 02/2021 reviewed.  Prediabetes: Her last A1C was 5.7%, 04/2023. She is not taking any oral diabetic medication at this time. She does not check her sugars.  OA: Mainly in her knees. She takes tylenol as needed with some relief of symptoms. She does not follow with orthopedics.  Anemia: Her last H/H was 9.6/31, 04/2023. She is not taking any oral iron at this time. She does not follow with hematology.  Review of Systems  Past Medical History:  Diagnosis Date   Atherosclerosis    on Chest CT 08/2014; 70-80% innominate artery, right subclavian artery, 75% celiac artery, 3 vessel coronary artery involvement   CKD (chronic kidney disease) stage 3, GFR 30-59 ml/min (HCC)    Coronary atherosclerosis    History of tobacco use    Hyperlipidemia    Hypertension    IFG (impaired fasting glucose)    Left ventricular diastolic dysfunction    Left ventricular hypertrophy    Mitral valve regurgitation    Obesity    PVD (peripheral vascular disease) (HCC)    Renal artery atherosclerosis (HCC)    greater than 60% stenosis right renal artery   Tricuspid regurgitation    Vitamin B12 deficiency    Vitamin D deficiency disease     Current Outpatient Medications  Medication Sig Dispense Refill   amLODipine-olmesartan (AZOR) 10-40 MG  tablet Take 1 tablet by mouth daily. 90 tablet 1   Bevacizumab (AVASTIN IV) Inject 1.25 mg into the eye every 6 (six) weeks. evry 12 weeks (Patient not taking: Reported on 11/07/2023)     clopidogrel (PLAVIX) 75 MG tablet Take 1 tablet (75 mg total) by mouth daily. 90 tablet 1   Multiple Vitamins-Minerals (PRESERVISION AREDS 2 PO) Take by mouth.     rosuvastatin (CRESTOR) 40 MG tablet Take 1 tablet (40 mg total) by mouth daily. 90 tablet 1   No current facility-administered medications for this visit.    Allergies  Allergen Reactions   Cozaar [Losartan] Other (See Comments)    cramps   Elemental Sulfur Diarrhea and Nausea And Vomiting   Felodipine Swelling   Lisinopril Other (See Comments)   Metoprolol Swelling   Neomycin Rash   Nickel Rash   Penicillins Rash    Family History  Problem Relation Age of Onset   Cancer Mother        colon   Emphysema Father    Heart disease Father    Heart attack Father    Cancer Sister 57       breast   Lupus Sister    Heart disease Sister    Multiple sclerosis Brother    Hypertension Sister    Cancer Daughter        breast   Cancer Paternal Grandmother        female  Social History   Socioeconomic History   Marital status: Divorced    Spouse name: Not on file   Number of children: 3   Years of education: Not on file   Highest education level: 11th grade  Occupational History    Employer: RETIRED  Tobacco Use   Smoking status: Former    Current packs/day: 0.00    Average packs/day: 2.0 packs/day for 64.0 years (128.0 ttl pk-yrs)    Types: Cigarettes    Start date: 04/14/1950    Quit date: 04/14/2014    Years since quitting: 9.5   Smokeless tobacco: Never   Tobacco comments:    smoking cessation materials not required  Vaping Use   Vaping status: Never Used  Substance and Sexual Activity   Alcohol use: Not Currently   Drug use: No   Sexual activity: Not Currently  Other Topics Concern   Not on file  Social History  Narrative   Not on file   Social Drivers of Health   Financial Resource Strain: Low Risk  (01/20/2023)   Overall Financial Resource Strain (CARDIA)    Difficulty of Paying Living Expenses: Not hard at all  Food Insecurity: No Food Insecurity (01/20/2023)   Hunger Vital Sign    Worried About Running Out of Food in the Last Year: Never true    Ran Out of Food in the Last Year: Never true  Transportation Needs: No Transportation Needs (01/20/2023)   PRAPARE - Administrator, Civil Service (Medical): No    Lack of Transportation (Non-Medical): No  Physical Activity: Insufficiently Active (01/20/2023)   Exercise Vital Sign    Days of Exercise per Week: 3 days    Minutes of Exercise per Session: 30 min  Stress: No Stress Concern Present (01/20/2023)   [employer] of Occupational Health - Occupational Stress Questionnaire    Feeling of Stress : Only a little  Social Connections: Socially Isolated (01/20/2023)   Social Connection and Isolation Panel [NHANES]    Frequency of Communication with Friends and Family: More than three times a week    Frequency of Social Gatherings with Friends and Family: Once a week    Attends Religious Services: Never    Database administrator or Organizations: No    Attends Banker Meetings: Never    Marital Status: Divorced  Catering manager Violence: Not At Risk (01/20/2023)   Humiliation, Afraid, Rape, and Kick questionnaire    Fear of Current or Ex-Partner: No    Emotionally Abused: No    Physically Abused: No    Sexually Abused: No     Constitutional: Denies fever, malaise, fatigue, headache or abrupt weight changes.  HEENT: Denies eye pain, eye redness, ear pain, ringing in the ears, wax buildup, runny nose, nasal congestion, bloody nose, or sore throat. Respiratory: Denies difficulty breathing, shortness of breath, cough or sputum production.   Cardiovascular: Pt reports swelling in legs. Denies chest pain, chest  tightness, palpitations or swelling in the hands.  Gastrointestinal: Denies abdominal pain, bloating, constipation, diarrhea or blood in the stool.  GU: Denies urgency, frequency, pain with urination, burning sensation, blood in urine, odor or discharge. Musculoskeletal: Pt reports knee pain. Denies decrease in range of motion, difficulty with gait, muscle pain or joint swelling.  Skin: Denies redness, rashes, lesions or ulcercations.  Neurological: Denies dizziness, difficulty with memory, difficulty with speech or problems with balance and coordination.  Psych: Denies anxiety, depression, SI/HI.  No other specific complaints in a  complete review of systems (except as listed in HPI above).     Objective:   Physical Exam BP 138/64 (BP Location: Left Arm, Patient Position: Sitting, Cuff Size: Normal)   Ht 5' (1.524 m)   Wt 145 lb 3.2 oz (65.9 kg)   BMI 28.36 kg/m    Wt Readings from Last 3 Encounters:  11/07/23 143 lb 3.2 oz (65 kg)  04/28/23 149 lb (67.6 kg)  02/21/23 145 lb 9.6 oz (66 kg)    General: Appears her stated age, overweight, in NAD. Skin: Warm, dry and intact.  HEENT: Head: normal shape and size; Eyes: sclera white, no icterus, conjunctiva pink, PERRLA and EOMs intact;  Cardiovascular: Normal rate and rhythm. S1,S2 noted.  Murmur noted. No JVD. Trace pitting BLE edema.  Bilateral carotid bruits noted. Pulmonary/Chest: Normal effort and positive vesicular breath sounds. No respiratory distress. No wheezes, rales or ronchi noted.  Abdomen: Normal bowel sounds.  Musculoskeletal: Joint enlargement noted in knees. No difficulty with gait.  Neurological: Alert and oriented. Coordination normal.  Psychiatric: Mood and affect normal. Behavior is normal. Judgment and thought content normal.     BMET    Component Value Date/Time   NA 143 04/28/2023 1329   NA 142 09/25/2015 1135   K 4.2 04/28/2023 1329   CL 105 04/28/2023 1329   CO2 29 04/28/2023 1329   GLUCOSE 134  04/28/2023 1329   BUN 14 04/28/2023 1329   BUN 12 09/25/2015 1135   BUN 18 12/17/2014 1418   CREATININE 1.02 (H) 04/28/2023 1329   CALCIUM 9.5 04/28/2023 1329   GFRNONAA >60 02/12/2021 1125   GFRNONAA 59 (L) 12/04/2019 0933   GFRAA 68 12/04/2019 0933    Lipid Panel     Component Value Date/Time   CHOL 139 04/28/2023 1329   CHOL 218 (H) 09/25/2015 1135   TRIG 120 04/28/2023 1329   HDL 50 04/28/2023 1329   HDL 50 09/25/2015 1135   CHOLHDL 2.8 04/28/2023 1329   VLDL 22 04/18/2017 0909   LDLCALC 68 04/28/2023 1329    CBC    Component Value Date/Time   WBC 5.0 04/28/2023 1329   RBC 3.89 04/28/2023 1329   HGB 9.6 (L) 04/28/2023 1329   HGB 11.1 09/25/2015 1135   HCT 31.0 (L) 04/28/2023 1329   HCT 34.7 09/25/2015 1135   PLT 203 04/28/2023 1329   PLT 258 09/25/2015 1135   MCV 79.7 (L) 04/28/2023 1329   MCV 82 09/25/2015 1135   MCH 24.7 (L) 04/28/2023 1329   MCHC 31.0 (L) 04/28/2023 1329   RDW 14.8 04/28/2023 1329   RDW 16.8 (H) 09/25/2015 1135   LYMPHSABS 845 (L) 04/28/2023 1329   LYMPHSABS 0.8 09/25/2015 1135   MONOABS 468 12/28/2016 1043   EOSABS 20 04/28/2023 1329   EOSABS 0.1 09/25/2015 1135   BASOSABS 30 04/28/2023 1329   BASOSABS 0.0 09/25/2015 1135    Hgb A1C Lab Results  Component Value Date   HGBA1C 5.7 (H) 04/28/2023           Assessment & Plan:    RTC in 6 months, follow up chronic conditions Nicki Reaper, NP

## 2023-11-15 NOTE — Assessment & Plan Note (Signed)
CMET and lipid profile today Encouraged her to consume a low fat diet Continue rosuvastatin and plavix

## 2023-11-15 NOTE — Assessment & Plan Note (Signed)
 Encourage diet and exercise for weight loss

## 2023-11-15 NOTE — Assessment & Plan Note (Signed)
 A1c today Encourage low-carb diet and exercise for weight loss

## 2023-11-15 NOTE — Assessment & Plan Note (Addendum)
 Encourage weight loss as this can reduce joint pain Continue Tylenol OTC as needed

## 2023-11-15 NOTE — Patient Instructions (Signed)

## 2023-11-15 NOTE — Assessment & Plan Note (Signed)
CBC and iron panel today 

## 2023-11-15 NOTE — Assessment & Plan Note (Signed)
 Controlled on amlodipine-olmesartan, refilled today Reinforced DASH diet and exercise for weight loss [facility] today

## 2023-11-16 LAB — HEMOGLOBIN A1C
Hgb A1c MFr Bld: 5.8 %{Hb} — ABNORMAL HIGH (ref <5.7)
Mean Plasma Glucose: 120 mg/dL
eAG (mmol/L): 6.6 mmol/L

## 2023-11-16 LAB — CBC
HCT: 32.7 % — ABNORMAL LOW (ref 35.0–45.0)
Hemoglobin: 10 g/dL — ABNORMAL LOW (ref 11.7–15.5)
MCH: 26.5 pg — ABNORMAL LOW (ref 27.0–33.0)
MCHC: 30.6 g/dL — ABNORMAL LOW (ref 32.0–36.0)
MCV: 86.7 fL (ref 80.0–100.0)
MPV: 12.5 fL (ref 7.5–12.5)
Platelets: 200 10*3/uL (ref 140–400)
RBC: 3.77 10*6/uL — ABNORMAL LOW (ref 3.80–5.10)
RDW: 14.4 % (ref 11.0–15.0)
WBC: 4.3 10*3/uL (ref 3.8–10.8)

## 2023-11-16 LAB — LIPID PANEL
Cholesterol: 165 mg/dL (ref <200)
HDL: 64 mg/dL (ref >=50)
LDL Cholesterol (Calc): 77 mg/dL
Non-HDL Cholesterol (Calc): 101 mg/dL (ref <130)
Total CHOL/HDL Ratio: 2.6 (calc) (ref <5.0)
Triglycerides: 141 mg/dL (ref <150)

## 2023-11-16 LAB — COMPLETE METABOLIC PANEL WITH GFR
AG Ratio: 2.2 (calc) (ref 1.0–2.5)
ALT: 16 U/L (ref 6–29)
AST: 25 U/L (ref 10–35)
Albumin: 4.6 g/dL (ref 3.6–5.1)
Alkaline phosphatase (APISO): 53 U/L (ref 37–153)
BUN/Creatinine Ratio: 13 (calc) (ref 6–22)
BUN: 14 mg/dL (ref 7–25)
CO2: 27 mmol/L (ref 20–32)
Calcium: 9.7 mg/dL (ref 8.6–10.4)
Chloride: 106 mmol/L (ref 98–110)
Creat: 1.12 mg/dL — ABNORMAL HIGH (ref 0.60–0.95)
Globulin: 2.1 g/dL (ref 1.9–3.7)
Glucose, Bld: 108 mg/dL (ref 65–139)
Potassium: 4.9 mmol/L (ref 3.5–5.3)
Sodium: 141 mmol/L (ref 135–146)
Total Bilirubin: 0.5 mg/dL (ref 0.2–1.2)
Total Protein: 6.7 g/dL (ref 6.1–8.1)
eGFR: 48 mL/min/{1.73_m2} — ABNORMAL LOW (ref >=60)

## 2023-12-02 ENCOUNTER — Encounter (INDEPENDENT_AMBULATORY_CARE_PROVIDER_SITE_OTHER): Payer: Medicare Other | Admitting: Ophthalmology

## 2023-12-02 DIAGNOSIS — D3131 Benign neoplasm of right choroid: Secondary | ICD-10-CM

## 2023-12-02 DIAGNOSIS — H35033 Hypertensive retinopathy, bilateral: Secondary | ICD-10-CM | POA: Diagnosis not present

## 2023-12-02 DIAGNOSIS — H43813 Vitreous degeneration, bilateral: Secondary | ICD-10-CM

## 2023-12-02 DIAGNOSIS — I10 Essential (primary) hypertension: Secondary | ICD-10-CM

## 2023-12-02 DIAGNOSIS — H353231 Exudative age-related macular degeneration, bilateral, with active choroidal neovascularization: Secondary | ICD-10-CM | POA: Diagnosis not present

## 2024-01-24 ENCOUNTER — Encounter (INDEPENDENT_AMBULATORY_CARE_PROVIDER_SITE_OTHER): Payer: Self-pay

## 2024-01-26 ENCOUNTER — Ambulatory Visit (INDEPENDENT_AMBULATORY_CARE_PROVIDER_SITE_OTHER): Payer: Medicare Other

## 2024-01-26 VITALS — Ht 60.0 in | Wt 147.0 lb

## 2024-01-26 DIAGNOSIS — Z0000 Encounter for general adult medical examination without abnormal findings: Secondary | ICD-10-CM

## 2024-01-26 NOTE — Patient Instructions (Addendum)
 Angela Duffy , Thank you for taking time out of your busy schedule to complete your Annual Wellness Visit with me. I enjoyed our conversation and look forward to speaking with you again next year. I, as well as your care team,  appreciate your ongoing commitment to your health goals. Please review the following plan we discussed and let me know if I can assist you in the future. Your Game plan/ To Do List    Referrals: If you haven't heard from the office you've been referred to, please reach out to them at the phone provided.   Follow up Visits: Next Medicare AWV with our clinical staff: 02/01/25 @ 10:20p   Have you seen your provider in the last 6 months (3 months if uncontrolled diabetes)?  Next Office Visit with your provider: 05/18/24 @ 10:20a  Clinician Recommendations:  Aim for 30 minutes of exercise or brisk walking, 6-8 glasses of water, and 5 servings of fruits and vegetables each day.       This is a list of the screening recommended for you and due dates:  Health Maintenance  Topic Date Due   DEXA scan (bone density measurement)  Never done   Zoster (Shingles) Vaccine (1 of 2) Never done   Flu Shot  04/06/2024   COVID-19 Vaccine (5 - 2024-25 season) 04/23/2024   Medicare Annual Wellness Visit  01/25/2025   DTaP/Tdap/Td vaccine (2 - Td or Tdap) 04/24/2028   Pneumonia Vaccine  Completed   HPV Vaccine  Aged Out   Meningitis B Vaccine  Aged Out   Mammogram  Discontinued    Advanced directives: (Copy Requested) Please bring a copy of your health care power of attorney and living will to the office to be added to your chart at your convenience. You can mail to [facility] [address] [place], [place] [address] or email to [email or web address] Advance Care Planning is important because it:  [x]  Makes sure you receive the medical care that is consistent with your values, goals, and preferences  [x]  It provides guidance to your family and loved ones and  reduces their decisional burden about whether or not they are making the right decisions based on your wishes.  Follow the link provided in your after visit summary or read over the paperwork we have mailed to you to help you started getting your Advance Directives in place. If you need assistance in completing these, please reach out to us  so that we can help you!  See attachments for Preventive Care and Fall Prevention Tips.

## 2024-01-26 NOTE — Progress Notes (Signed)
 Subjective:   Angela Duffy is a 89 y.o. who presents for a Medicare Wellness preventive visit.  As a reminder, Annual Wellness Visits don't include a physical exam, and some assessments may be limited, especially if this visit is performed virtually. We may recommend an in-person follow-up visit with your provider if needed.  Visit Complete: Virtual I connected with  Angela Duffy on 01/26/24 by a audio enabled telemedicine application and verified that I am speaking with the correct person using two identifiers.  Patient Location: Home  Provider Location: Home Office  I discussed the limitations of evaluation and management by telemedicine. The patient expressed understanding and agreed to proceed.  Vital Signs: Because this visit was a virtual/telehealth visit, some criteria may be missing or patient reported. Any vitals not documented were not able to be obtained and vitals that have been documented are patient reported.    Persons Participating in Visit: Patient.  AWV Questionnaire: No: Patient Medicare AWV questionnaire was not completed prior to this visit.  Cardiac Risk Factors include: advanced age (>62men, >39 women);hypertension     Objective:     Today's Vitals   01/26/24 1513  Weight: 147 lb (66.7 kg)  Height: 5' (1.524 m)   Body mass index is 28.71 kg/m.     01/26/2024    3:19 PM 01/20/2023    2:36 PM 02/12/2021   10:16 AM 04/20/2018    2:38 PM 06/29/2017    9:57 AM 12/28/2016    9:58 AM 07/07/2016   11:40 AM  Advanced Directives  Does Patient Have a Medical Advance Directive? Yes No No No No No No  Type of Estate agent of [place];Living will        Copy of Healthcare Power of Attorney in Chart? No - copy requested        Would patient like information on creating a medical advance directive?  No - Patient declined  No - Patient declined   No - patient declined information    Current Medications (verified) Outpatient  Encounter Medications as of 01/26/2024  Medication Sig   amLODipine -olmesartan  (AZOR ) 10-40 MG tablet Take 1 tablet by mouth daily.   Bevacizumab  (AVASTIN  IV) Inject 1.25 mg into the eye every 6 (six) weeks. evry 12 weeks   clopidogrel  (PLAVIX ) 75 MG tablet Take 1 tablet (75 mg total) by mouth daily.   Coenzyme Q10 (CO Q-10) 100 MG CAPS Take 100 mg by mouth.   Magnesium  Oxide -Mg Supplement 500 MG CAPS Take 500 mg by mouth daily.   Multiple Vitamins-Minerals (PRESERVISION AREDS 2 PO) Take by mouth.   rosuvastatin  (CRESTOR ) 40 MG tablet Take 1 tablet (40 mg total) by mouth daily.   No facility-administered encounter medications on file as of 01/26/2024.    Allergies (verified) Cozaar [losartan], Elemental sulfur, Felodipine, Lisinopril, Metoprolol, Neomycin, Nickel, and Penicillins   History: Past Medical History:  Diagnosis Date   Atherosclerosis    on Chest CT 08/2014; 70-80% innominate artery, right subclavian artery, 75% celiac artery, 3 vessel coronary artery involvement   CKD (chronic kidney disease) stage 3, GFR 30-59 ml/min (HCC)    Coronary atherosclerosis    History of tobacco use    Hyperlipidemia    Hypertension    IFG (impaired fasting glucose)    Left ventricular diastolic dysfunction    Left ventricular hypertrophy    Mitral valve regurgitation    Obesity    PVD (peripheral vascular disease) (HCC)    Renal artery atherosclerosis (  HCC)    greater than 60% stenosis right renal artery   Tricuspid regurgitation    Vitamin B12 deficiency    Vitamin D  deficiency disease    Past Surgical History:  Procedure Laterality Date   CAROTID SURGERY  Feb 2004   CERVICAL DISCECTOMY  1970s   ILIAC ARTERY STENT  2003   KIDNEY STENT  April 2016   LAPAROSCOPY  1970s   Family History  Problem Relation Age of Onset   Cancer Mother        colon   Emphysema Father    Heart disease Father    Heart attack Father    Cancer Sister 21       breast   Lupus Sister    Heart disease  Sister    Multiple sclerosis Brother    Hypertension Sister    Cancer Daughter        breast   Cancer Paternal Grandmother        female   Social History   Socioeconomic History   Marital status: Divorced    Spouse name: Not on file   Number of children: 3   Years of education: Not on file   Highest education level: 11th grade  Occupational History    Employer: RETIRED  Tobacco Use   Smoking status: Former    Current packs/day: 0.00    Average packs/day: 2.0 packs/day for 64.0 years (128.0 ttl pk-yrs)    Types: Cigarettes    Start date: 04/14/1950    Quit date: 04/14/2014    Years since quitting: 9.7   Smokeless tobacco: Never   Tobacco comments:    smoking cessation materials not required  Vaping Use   Vaping status: Never Used  Substance and Sexual Activity   Alcohol use: Not Currently   Drug use: No   Sexual activity: Not Currently  Other Topics Concern   Not on file  Social History Narrative   Not on file   Social Drivers of Health   Financial Resource Strain: Low Risk  (01/26/2024)   Overall Financial Resource Strain (CARDIA)    Difficulty of Paying Living Expenses: Not hard at all  Food Insecurity: No Food Insecurity (01/26/2024)   Hunger Vital Sign    Worried About Running Out of Food in the Last Year: Never true    Ran Out of Food in the Last Year: Never true  Transportation Needs: No Transportation Needs (01/26/2024)   PRAPARE - Administrator, Civil Service (Medical): No    Lack of Transportation (Non-Medical): No  Physical Activity: Inactive (01/26/2024)   Exercise Vital Sign    Days of Exercise per Week: 0 days    Minutes of Exercise per Session: 0 min  Stress: No Stress Concern Present (01/26/2024)   [employer] of Occupational Health - Occupational Stress Questionnaire    Feeling of Stress : Not at all  Social Connections: Socially Isolated (01/26/2024)   Social Connection and Isolation Panel [NHANES]    Frequency of Communication  with Friends and Family: More than three times a week    Frequency of Social Gatherings with Friends and Family: More than three times a week    Attends Religious Services: Never    Database administrator or Organizations: No    Attends Banker Meetings: Never    Marital Status: Divorced    Tobacco Counseling Counseling given: Not Answered Tobacco comments: smoking cessation materials not required    Clinical Intake:  Pre-visit preparation  completed: Yes  Pain : No/denies pain     BMI - recorded: 28.71 Nutritional Status: BMI 25 -29 Overweight Nutritional Risks: None Diabetes: No  Lab Results  Component Value Date   HGBA1C 5.8 (H) 11/15/2023   HGBA1C 5.7 (H) 04/28/2023   HGBA1C 5.8 (H) 10/21/2022     How often do you need to have someone help you when you read instructions, pamphlets, or other written materials from your doctor or pharmacy?: 1 - Never  Interpreter Needed?: No  Information entered by :: Farris Hong LPN   Activities of Daily Living     01/26/2024    3:19 PM 04/28/2023    1:32 PM  In your present state of health, do you have any difficulty performing the following activities:  Hearing? 0 1  Vision? 0 1  Difficulty concentrating or making decisions? 0 1  Walking or climbing stairs? 0 1  Dressing or bathing? 0 0  Doing errands, shopping? 0 1  Preparing Food and eating ? N   Using the Toilet? N   In the past six months, have you accidently leaked urine? N   Do you have problems with loss of bowel control? N   Managing your Medications? N   Managing your Finances? N   Housekeeping or managing your Housekeeping? N     Patient Care Team: Carollynn Cirri, NP as PCP - General (Internal Medicine) Cara Chancellor Wiliam Harder, MD as Consulting Physician (Cardiology) Schnier, Ninette Basque, MD (Vascular Surgery) Rexene Catching, MD as Consulting Physician (Ophthalmology)  Indicate any recent Medical Services you may have received from other than  [facility] providers in the past year (date may be approximate).     Assessment:    This is a routine wellness examination for [place].  Hearing/Vision screen Hearing Screening - Comments:: Denies hearing difficulties   Vision Screening - Comments:: Wears rx glasses - up to date with routine eye exams with  Dr Augustus Ledger   Goals Addressed               This Visit's Progress     Remain Active (pt-stated)         Depression Screen     01/26/2024    3:18 PM 11/15/2023   11:15 AM 11/15/2023   11:14 AM 04/28/2023    1:31 PM 01/25/2023    1:28 PM 01/20/2023    2:33 PM 10/21/2022    2:43 PM  PHQ 2/9 Scores  PHQ - 2 Score 0 0 0 0 0 2 0  PHQ- 9 Score  4   1 4      Fall Risk     01/26/2024    3:19 PM 11/15/2023   11:14 AM 04/28/2023    1:31 PM 01/25/2023    1:28 PM 01/20/2023    2:37 PM  Fall Risk   Falls in the past year? 0 0 0 0 0  Number falls in past yr: 0    0  Injury with Fall? 0 0 0 0 0  Risk for fall due to : No Fall Risks  No Fall Risks Impaired vision No Fall Risks;Impaired balance/gait  Follow up Falls evaluation completed    Falls prevention discussed;Falls evaluation completed    MEDICARE RISK AT HOME:  Medicare Risk at Home Any stairs in or around the home?: Yes If so, are there any without handrails?: No Home free of loose throw rugs in walkways, pet beds, electrical cords, etc?: Yes Adequate lighting in your home to reduce  risk of falls?: Yes Life alert?: No Use of a cane, walker or w/c?: No Grab bars in the bathroom?: Yes Shower chair or bench in shower?: Yes Elevated toilet seat or a handicapped toilet?: Yes  TIMED UP AND GO:  Was the test performed?  No  Cognitive Function: 6CIT completed        01/26/2024    3:20 PM 01/20/2023    2:41 PM 01/04/2022    1:54 PM 04/20/2018    2:41 PM  6CIT Screen  What Year? 0 points 0 points 0 points 0 points  What month? 0 points 0 points 0 points 0 points  What time? 0 points 3 points 0 points 3 points  Count back  from 20 0 points 0 points 0 points 0 points  Months in reverse 0 points 0 points 2 points 0 points  Repeat phrase 0 points 2 points 2 points 0 points  Total Score 0 points 5 points 4 points 3 points    Immunizations Immunization History  Administered Date(s) Administered   Influenza Split 05/21/2021   Influenza, High Dose Seasonal PF 10/25/2023   Influenza-Unspecified 06/09/2017, 05/25/2018, 05/08/2019   Moderna Covid-19 Fall Seasonal Vaccine 24yrs & older 10/25/2023   PFIZER(Purple Top)SARS-COV-2 Vaccination 10/02/2019, 10/23/2019, 06/07/2020   Pneumococcal Conjugate-13 03/18/2014   Pneumococcal Polysaccharide-23 04/30/2002, 09/06/2012   Tdap 04/24/2018    Screening Tests Health Maintenance  Topic Date Due   DEXA SCAN  Never done   Zoster Vaccines- Shingrix (1 of 2) Never done   INFLUENZA VACCINE  04/06/2024   COVID-19 Vaccine (5 - 2024-25 season) 04/23/2024   Medicare Annual Wellness (AWV)  01/25/2025   DTaP/Tdap/Td (2 - Td or Tdap) 04/24/2028   Pneumonia Vaccine 70+ Years old  Completed   HPV VACCINES  Aged Out   Meningococcal B Vaccine  Aged Out   MAMMOGRAM  Discontinued    Health Maintenance  Health Maintenance Due  Topic Date Due   DEXA SCAN  Never done   Zoster Vaccines- Shingrix (1 of 2) Never done   Health Maintenance Items Addressed:   Additional Screening:  Vision Screening: Recommended annual ophthalmology exams for early detection of glaucoma and other disorders of the eye.  Dental Screening: Recommended annual dental exams for proper oral hygiene  Community Resource Referral / Chronic Care Management: CRR required this visit?  No   CCM required this visit?  No   Plan:    I have personally reviewed and noted the following in the patient's chart:   Medical and social history Use of alcohol, tobacco or illicit drugs  Current medications and supplements including opioid prescriptions. Patient is not currently taking opioid  prescriptions. Functional ability and status Nutritional status Physical activity Advanced directives List of other physicians Hospitalizations, surgeries, and ER visits in previous 12 months Vitals Screenings to include cognitive, depression, and falls Referrals and appointments  In addition, I have reviewed and discussed with patient certain preventive protocols, quality metrics, and best practice recommendations. A written personalized care plan for preventive services as well as general preventive health recommendations were provided to patient.   Dewayne Ford, LPN   1/61/0960   After Visit Summary: (MyChart) Due to this being a telephonic visit, the after visit summary with patients personalized plan was offered to patient via MyChart   Notes: Nothing significant to report at this time.

## 2024-02-03 ENCOUNTER — Encounter (INDEPENDENT_AMBULATORY_CARE_PROVIDER_SITE_OTHER): Admitting: Ophthalmology

## 2024-02-03 DIAGNOSIS — H353231 Exudative age-related macular degeneration, bilateral, with active choroidal neovascularization: Secondary | ICD-10-CM

## 2024-02-03 DIAGNOSIS — D3131 Benign neoplasm of right choroid: Secondary | ICD-10-CM | POA: Diagnosis not present

## 2024-02-03 DIAGNOSIS — H35033 Hypertensive retinopathy, bilateral: Secondary | ICD-10-CM

## 2024-02-03 DIAGNOSIS — I10 Essential (primary) hypertension: Secondary | ICD-10-CM | POA: Diagnosis not present

## 2024-02-03 DIAGNOSIS — H43813 Vitreous degeneration, bilateral: Secondary | ICD-10-CM | POA: Diagnosis not present

## 2024-03-13 ENCOUNTER — Other Ambulatory Visit: Payer: Self-pay | Admitting: Internal Medicine

## 2024-03-15 NOTE — Telephone Encounter (Signed)
 Rx 11/15/23 #90 1RF- too soon Requested Prescriptions  Pending Prescriptions Disp Refills   amLODipine -olmesartan  (AZOR ) 10-40 MG tablet [Pharmacy Med Name: AMLODIPINE -OLMESARTAN  10-40 MG] 90 tablet 0    Sig: Take 1 tablet by mouth daily.     Cardiovascular: CCB + ARB Combos Failed - 03/15/2024  2:07 PM      Failed - Cr in normal range and within 180 days    Creat  Date Value Ref Range Status  11/15/2023 1.12 (H) 0.60 - 0.95 mg/dL Final   Creatinine, Urine  Date Value Ref Range Status  06/18/2016 65 20 - 320 mg/dL Final         Passed - K in normal range and within 180 days    Potassium  Date Value Ref Range Status  11/15/2023 4.9 3.5 - 5.3 mmol/L Final         Passed - Na in normal range and within 180 days    Sodium  Date Value Ref Range Status  11/15/2023 141 135 - 146 mmol/L Final  09/25/2015 142 134 - 144 mmol/L Final         Passed - Patient is not pregnant      Passed - Last BP in normal range    BP Readings from Last 1 Encounters:  11/15/23 138/64         Passed - Valid encounter within last 6 months    Recent Outpatient Visits           4 months ago Prediabetes   [facility] [place], Angeline [place], [place]

## 2024-04-20 ENCOUNTER — Encounter (INDEPENDENT_AMBULATORY_CARE_PROVIDER_SITE_OTHER): Admitting: Ophthalmology

## 2024-04-20 DIAGNOSIS — H353231 Exudative age-related macular degeneration, bilateral, with active choroidal neovascularization: Secondary | ICD-10-CM | POA: Diagnosis not present

## 2024-04-20 DIAGNOSIS — H43813 Vitreous degeneration, bilateral: Secondary | ICD-10-CM | POA: Diagnosis not present

## 2024-04-20 DIAGNOSIS — H35033 Hypertensive retinopathy, bilateral: Secondary | ICD-10-CM | POA: Diagnosis not present

## 2024-04-20 DIAGNOSIS — D3131 Benign neoplasm of right choroid: Secondary | ICD-10-CM

## 2024-04-20 DIAGNOSIS — I10 Essential (primary) hypertension: Secondary | ICD-10-CM | POA: Diagnosis not present

## 2024-05-10 ENCOUNTER — Ambulatory Visit (INDEPENDENT_AMBULATORY_CARE_PROVIDER_SITE_OTHER)

## 2024-05-10 ENCOUNTER — Ambulatory Visit (INDEPENDENT_AMBULATORY_CARE_PROVIDER_SITE_OTHER): Admitting: Vascular Surgery

## 2024-05-10 VITALS — BP 126/68 | Resp 17 | Ht 61.0 in | Wt 143.0 lb

## 2024-05-10 DIAGNOSIS — I10 Essential (primary) hypertension: Secondary | ICD-10-CM

## 2024-05-10 DIAGNOSIS — I739 Peripheral vascular disease, unspecified: Secondary | ICD-10-CM

## 2024-05-10 DIAGNOSIS — I2510 Atherosclerotic heart disease of native coronary artery without angina pectoris: Secondary | ICD-10-CM | POA: Diagnosis not present

## 2024-05-10 DIAGNOSIS — E782 Mixed hyperlipidemia: Secondary | ICD-10-CM | POA: Diagnosis not present

## 2024-05-10 DIAGNOSIS — I708 Atherosclerosis of other arteries: Secondary | ICD-10-CM

## 2024-05-11 LAB — VAS US ABI WITH/WO TBI

## 2024-05-18 ENCOUNTER — Ambulatory Visit (INDEPENDENT_AMBULATORY_CARE_PROVIDER_SITE_OTHER): Admitting: Internal Medicine

## 2024-05-18 ENCOUNTER — Encounter: Payer: Self-pay | Admitting: Internal Medicine

## 2024-05-18 VITALS — BP 134/60 | HR 78 | Ht 61.0 in | Wt 143.0 lb

## 2024-05-18 DIAGNOSIS — I2510 Atherosclerotic heart disease of native coronary artery without angina pectoris: Secondary | ICD-10-CM

## 2024-05-18 DIAGNOSIS — Z23 Encounter for immunization: Secondary | ICD-10-CM | POA: Diagnosis not present

## 2024-05-18 DIAGNOSIS — R7303 Prediabetes: Secondary | ICD-10-CM

## 2024-05-18 DIAGNOSIS — I700 Atherosclerosis of aorta: Secondary | ICD-10-CM

## 2024-05-18 DIAGNOSIS — M170 Bilateral primary osteoarthritis of knee: Secondary | ICD-10-CM

## 2024-05-18 DIAGNOSIS — I708 Atherosclerosis of other arteries: Secondary | ICD-10-CM | POA: Diagnosis not present

## 2024-05-18 DIAGNOSIS — D631 Anemia in chronic kidney disease: Secondary | ICD-10-CM | POA: Diagnosis not present

## 2024-05-18 DIAGNOSIS — D692 Other nonthrombocytopenic purpura: Secondary | ICD-10-CM | POA: Diagnosis not present

## 2024-05-18 DIAGNOSIS — I739 Peripheral vascular disease, unspecified: Secondary | ICD-10-CM | POA: Diagnosis not present

## 2024-05-18 DIAGNOSIS — E782 Mixed hyperlipidemia: Secondary | ICD-10-CM | POA: Diagnosis not present

## 2024-05-18 DIAGNOSIS — N1831 Chronic kidney disease, stage 3a: Secondary | ICD-10-CM

## 2024-05-18 DIAGNOSIS — Z6827 Body mass index (BMI) 27.0-27.9, adult: Secondary | ICD-10-CM

## 2024-05-18 DIAGNOSIS — I10 Essential (primary) hypertension: Secondary | ICD-10-CM | POA: Diagnosis not present

## 2024-05-18 NOTE — Progress Notes (Signed)
 Subjective:    Patient ID: Angela Duffy, female    DOB: 1934-11-26, 89 y.o.   MRN: [id number]  HPI  Pt presents to the clinic today for 6 month follow up chronic conditions.  CKD 3: Her last creatinine was 1.12, GFR 48, 11/2023. She is on olmesartan  for renal protection. She does not follow with nephrology.  HLD with pad, cad/aortic atherosclerosis: Her last LDL was 77 triglycerides 141, 11/2023. She denies myalgias on rosuvastatin . She is taking plavix  as well. She does not consume a low fat diet. She follows with vascular.  HTN: her BP today is 134/60. She does have subclavian artery stenosis. She reports she has had a stent in her renal artery years ago. She is taking amlodipine -olmesartan  as prescribed. ECG from 02/2021 reviewed.  Prediabetes: Her last A1C was 5.8%, 11/2023. She is not taking any oral diabetic medication at this time. She does not check her sugars.  OA: Mainly in her knees. She takes tylenol as needed with some relief of symptoms. She does not follow with orthopedics.  Anemia: Her last H/H was 10/30.7, 11/2023. She is not taking any oral iron at this time. She does not follow with hematology.  Review of Systems  Past Medical History:  Diagnosis Date   Atherosclerosis    on Chest CT 08/2014; 70-80% innominate artery, right subclavian artery, 75% celiac artery, 3 vessel coronary artery involvement   CKD (chronic kidney disease) stage 3, GFR 30-59 ml/min (HCC)    Coronary atherosclerosis    History of tobacco use    Hyperlipidemia    Hypertension    IFG (impaired fasting glucose)    Left ventricular diastolic dysfunction    Left ventricular hypertrophy    Mitral valve regurgitation    Obesity    PVD (peripheral vascular disease) (HCC)    Renal artery atherosclerosis (HCC)    greater than 60% stenosis right renal artery   Tricuspid regurgitation    Vitamin B12 deficiency    Vitamin D  deficiency disease     Current Outpatient Medications  Medication  Sig Dispense Refill   amLODipine -olmesartan  (AZOR ) 10-40 MG tablet Take 1 tablet by mouth daily. 90 tablet 1   Bevacizumab  (AVASTIN  IV) Inject 1.25 mg into the eye every 6 (six) weeks. evry 12 weeks     clopidogrel  (PLAVIX ) 75 MG tablet Take 1 tablet (75 mg total) by mouth daily. 90 tablet 1   Coenzyme Q10 (CO Q-10) 100 MG CAPS Take 100 mg by mouth.     Magnesium  Oxide -Mg Supplement 500 MG CAPS Take 500 mg by mouth daily.     Multiple Vitamins-Minerals (PRESERVISION AREDS 2 PO) Take by mouth. (Patient not taking: Reported on 05/10/2024)     rosuvastatin  (CRESTOR ) 40 MG tablet Take 1 tablet (40 mg total) by mouth daily. 90 tablet 1   No current facility-administered medications for this visit.    Allergies  Allergen Reactions   Cozaar [Losartan] Other (See Comments)    cramps   Elemental Sulfur Diarrhea and Nausea And Vomiting   Felodipine Swelling   Lisinopril Other (See Comments)   Metoprolol Swelling   Neomycin Rash   Nickel Rash   Penicillins Rash    Family History  Problem Relation Age of Onset   Cancer Mother        colon   Emphysema Father    Heart disease Father    Heart attack Father    Cancer Sister 95       breast  Lupus Sister    Heart disease Sister    Multiple sclerosis Brother    Hypertension Sister    Cancer Daughter        breast   Cancer Paternal Grandmother        female    Social History   Socioeconomic History   Marital status: Divorced    Spouse name: Not on file   Number of children: 3   Years of education: Not on file   Highest education level: 11th grade  Occupational History    Employer: RETIRED  Tobacco Use   Smoking status: Former    Current packs/day: 0.00    Average packs/day: 2.0 packs/day for 64.0 years (128.0 ttl pk-yrs)    Types: Cigarettes    Start date: 04/14/1950    Quit date: 04/14/2014    Years since quitting: 10.1   Smokeless tobacco: Never   Tobacco comments:    smoking cessation materials not required  Vaping Use    Vaping status: Never Used  Substance and Sexual Activity   Alcohol use: Not Currently   Drug use: No   Sexual activity: Not Currently  Other Topics Concern   Not on file  Social History Narrative   Not on file   Social Drivers of Health   Financial Resource Strain: Low Risk  (01/26/2024)   Overall Financial Resource Strain (CARDIA)    Difficulty of Paying Living Expenses: Not hard at all  Food Insecurity: No Food Insecurity (01/26/2024)   Hunger Vital Sign    Worried About Running Out of Food in the Last Year: Never true    Ran Out of Food in the Last Year: Never true  Transportation Needs: No Transportation Needs (01/26/2024)   PRAPARE - Administrator, Civil Service (Medical): No    Lack of Transportation (Non-Medical): No  Physical Activity: Inactive (01/26/2024)   Exercise Vital Sign    Days of Exercise per Week: 0 days    Minutes of Exercise per Session: 0 min  Stress: No Stress Concern Present (01/26/2024)   [employer] of Occupational Health - Occupational Stress Questionnaire    Feeling of Stress : Not at all  Social Connections: Socially Isolated (01/26/2024)   Social Connection and Isolation Panel    Frequency of Communication with Friends and Family: More than three times a week    Frequency of Social Gatherings with Friends and Family: More than three times a week    Attends Religious Services: Never    Database administrator or Organizations: No    Attends Banker Meetings: Never    Marital Status: Divorced  Catering manager Violence: Not At Risk (01/26/2024)   Humiliation, Afraid, Rape, and Kick questionnaire    Fear of Current or Ex-Partner: No    Emotionally Abused: No    Physically Abused: No    Sexually Abused: No     Constitutional: Denies fever, malaise, fatigue, headache or abrupt weight changes.  HEENT: Denies eye pain, eye redness, ear pain, ringing in the ears, wax buildup, runny nose, nasal congestion, bloody nose, or  sore throat. Respiratory: Denies difficulty breathing, shortness of breath, cough or sputum production.   Cardiovascular: Denies chest pain, chest tightness, palpitations or swelling in the hands or feet.  Gastrointestinal: Denies abdominal pain, bloating, constipation, diarrhea or blood in the stool.  GU: Denies urgency, frequency, pain with urination, burning sensation, blood in urine, odor or discharge. Musculoskeletal: Pt reports knee pain. Denies decrease in range of  motion, difficulty with gait, muscle pain or joint swelling.  Skin: Denies redness, rashes, lesions or ulcercations.  Neurological: Denies dizziness, difficulty with memory, difficulty with speech or problems with balance and coordination.  Psych: Denies anxiety, depression, SI/HI.  No other specific complaints in a complete review of systems (except as listed in HPI above).     Objective:   Physical Exam BP 134/60 (BP Location: Right Arm, Patient Position: Sitting, Cuff Size: Normal)   Pulse 78   Ht 5' 1 (1.549 m)   Wt 143 lb (64.9 kg)   SpO2 98%   BMI 27.02 kg/m     Wt Readings from Last 3 Encounters:  05/10/24 143 lb (64.9 kg)  01/26/24 147 lb (66.7 kg)  11/15/23 145 lb 3.2 oz (65.9 kg)    General: Appears her stated age, overweight, in NAD. Skin: Warm, dry and intact. Senile purpura noted of BUE. Cardiovascular: Normal rate and rhythm. S1,S2 noted.  Murmur noted. No JVD or BLE edema.  Bilateral carotid bruits noted. Pulmonary/Chest: Normal effort and positive vesicular breath sounds. No respiratory distress. No wheezes, rales or ronchi noted.  Musculoskeletal: Joint enlargement noted in knees. No difficulty with gait.  Neurological: Alert and oriented. Coordination normal.  Psychiatric: Mood and affect normal. Behavior is normal. Judgment and thought content normal.     BMET    Component Value Date/Time   NA 141 11/15/2023 1134   NA 142 09/25/2015 1135   K 4.9 11/15/2023 1134   CL 106 11/15/2023  1134   CO2 27 11/15/2023 1134   GLUCOSE 108 11/15/2023 1134   BUN 14 11/15/2023 1134   BUN 12 09/25/2015 1135   BUN 18 12/17/2014 1418   CREATININE 1.12 (H) 11/15/2023 1134   CALCIUM  9.7 11/15/2023 1134   GFRNONAA >60 02/12/2021 1125   GFRNONAA 59 (L) 12/04/2019 0933   GFRAA 68 12/04/2019 0933    Lipid Panel     Component Value Date/Time   CHOL 165 11/15/2023 1134   CHOL 218 (H) 09/25/2015 1135   TRIG 141 11/15/2023 1134   HDL 64 11/15/2023 1134   HDL 50 09/25/2015 1135   CHOLHDL 2.6 11/15/2023 1134   VLDL 22 04/18/2017 0909   LDLCALC 77 11/15/2023 1134    CBC    Component Value Date/Time   WBC 4.3 11/15/2023 1134   RBC 3.77 (L) 11/15/2023 1134   HGB 10.0 (L) 11/15/2023 1134   HGB 11.1 09/25/2015 1135   HCT 32.7 (L) 11/15/2023 1134   HCT 34.7 09/25/2015 1135   PLT 200 11/15/2023 1134   PLT 258 09/25/2015 1135   MCV 86.7 11/15/2023 1134   MCV 82 09/25/2015 1135   MCH 26.5 (L) 11/15/2023 1134   MCHC 30.6 (L) 11/15/2023 1134   RDW 14.4 11/15/2023 1134   RDW 16.8 (H) 09/25/2015 1135   LYMPHSABS 845 (L) 04/28/2023 1329   LYMPHSABS 0.8 09/25/2015 1135   MONOABS 468 12/28/2016 1043   EOSABS 20 04/28/2023 1329   EOSABS 0.1 09/25/2015 1135   BASOSABS 30 04/28/2023 1329   BASOSABS 0.0 09/25/2015 1135    Hgb A1C Lab Results  Component Value Date   HGBA1C 5.8 (H) 11/15/2023           Assessment & Plan:    RTC in 6 months, follow up chronic conditions Angeline Laura, NP

## 2024-05-18 NOTE — Assessment & Plan Note (Signed)
 CMET and lipid profile today Encouraged her to consume a low fat diet Continue rosuvastatin  40 mg and plavix  75 mg daily

## 2024-05-18 NOTE — Assessment & Plan Note (Signed)
 A1c today Encourage low-carb diet and exercise for weight loss

## 2024-05-18 NOTE — Assessment & Plan Note (Signed)
 Encourage weight loss as this can reduce joint pain Continue tylenol OTC as needed

## 2024-05-18 NOTE — Assessment & Plan Note (Signed)
 Encourage diet and exercise for weight loss

## 2024-05-18 NOTE — Assessment & Plan Note (Signed)
 CBC today.

## 2024-05-18 NOTE — Assessment & Plan Note (Signed)
 C-Met and lipid profile today Continue olmesartan  40 mg for renal protection

## 2024-05-18 NOTE — Assessment & Plan Note (Signed)
 Controlled on amlodipine -olmesartan  10-40 mg daily Reinforced DASH diet and exercise for weight loss C-Met today

## 2024-05-18 NOTE — Patient Instructions (Signed)
 Atherosclerosis  Atherosclerosis is when plaque builds up in the arteries. This causes narrowing and hardening of the arteries. Arteries are blood vessels that carry blood from the heart to all parts of the body. This blood contains oxygen. Plaque occurs due to inflammation or from a buildup of fat, cholesterol, calcium, waste products of cells, and a clotting material in the blood (fibrin). Plaque decreases the amount of blood that can flow through the artery. Atherosclerosis can affect any artery in your body, including: Heart arteries. Damage to these arteries may lead to coronary artery disease, which can cause a heart attack. Brain arteries. Damage to these arteries may cause a stroke. Leg, arm, and pelvis arteries. Peripheral artery disease (PAD) may result from damage to these arteries. Kidney arteries. Kidney (renal) failure may result from damage to kidney arteries. Treatment may slow the disease and prevent further damage to your heart, brain, peripheral arteries, and kidneys. What are the causes? This condition develops slowly over many years. The inner layers of your arteries become damaged and allow the gradual buildup of plaque. The exact cause of atherosclerosis is not fully understood. Symptoms of atherosclerosis do not occur until an artery becomes narrow or blocked. What increases the risk? The following factors may make you more likely to develop this condition: Being middle-aged or older. Certain medical conditions, including: High blood pressure. High cholesterol. High blood fats (triglycerides). Diabetes. Sleep apnea. Obesity. Certain lab levels, including: Elevated C-reactive protein (CRP). This is a sign of increased inflammation in your body. Elevated homocysteine levels. This is an amino acid that is associated with heart and blood vessel disease. Using tobacco or nicotine products. A family history of atherosclerosis. Not exercising enough (sedentary  lifestyle). Being stressed. Drinking too much alcohol or using drugs, such as cocaine or methamphetamine. What are the signs or symptoms? Symptoms of atherosclerosis do not occur until the plaque severely narrows or blocks the artery, which decreases blood flow. Sometimes, atherosclerosis does not cause symptoms. Symptoms of this condition include: Coronary artery disease. This may cause chest pain and shortness of breath. Decreased blood supply to your brain, which may cause a stroke. Signs of a stroke may include sudden: Weakness or numbness in your face, arm, or leg, especially on one side of your body. Trouble walking or difficulty moving your arms or legs. Loss of balance or coordination. Confusion. Slurred speech. Trouble speaking, or trouble understanding speech, or both (aphasia). Vision changes in one or both eyes. This may be double vision, blurred vision, or loss of vision. Severe headache with no known cause. The headache is often described as the worst headache ever experienced. PAD, which may cause pain, numbness, or nonhealing wounds, often in your legs and hips. Renal failure. This may cause tiredness, problems with urination, swelling, and itchy skin. How is this diagnosed? This condition is diagnosed based on your medical history and a physical exam. During the exam, your health care provider will: Check your pulse in different places. Listen for a "whooshing" sound over your arteries (bruit). You may also have tests, such as: Blood tests to check your levels of cholesterol, triglycerides, blood sugar, and CRP. Ankle-brachial index to compare blood pressure in your arms to blood pressure in your ankles to see how your blood is flowing. Heart (cardiac) tests. Electrocardiogram (ECG) to check for heart damage. Stress test to see how your heart reacts to exercise. Ultrasound tests. Ultrasound of your peripheral arteries to check blood flow. Echocardiogram to get images of  your heart's  chambers and valves. X-ray tests. Chest X-ray to see if you have an enlarged heart, which is a sign of heart failure. CT scan to check for damage to your heart, brain, or arteries. Angiogram. This is a test where dye is injected and X-rays are used to see the blood flow in the arteries. How is this treated? This condition is treated with lifestyle changes as the first step. These may include: Changing your diet. Losing weight. Reducing stress. Exercising and being physically active more regularly. Quitting smoking. You may also need medicine to: Lower triglycerides and cholesterol. Control blood pressure. Prevent blood clots. Lower inflammation in your body. Control your blood sugar. Sometimes, surgery is needed to: Remove plaque from an artery (endarterectomy). Open or widen a narrowed heart artery or peripheral artery (angioplasty). Create a new path for your blood with one of these procedures: Heart (coronary) artery bypass graft surgery. Peripheral artery bypass graft surgery. Place a small mesh tube (stent) in an artery to open or widen a narrowed artery. Follow these instructions at home: Eating and drinking  Eat a heart-healthy diet. Talk with your health care provider or a dietitian if you need help. A heart-healthy diet involves: Limiting unhealthy fats and increasing healthy fats. Some examples of healthy fats are avocados and olive oil. Eating plant-based foods, such as fruits, vegetables, nuts, whole grains, and legumes (such as peas and lentils). If you drink alcohol: Limit how much you have to: 0-1 drink a day for women who are not pregnant. 0-2 drinks a day for men. Know how much alcohol is in a drink. In the [place] one drink equals one 12 oz bottle of beer (355 mL), one 5 oz glass of wine (148 mL), or one 1 oz glass of hard liquor (44 mL). Lifestyle  Maintain a healthy weight. Lose weight if your health care provider says that you need to do  that. Follow an exercise program as told by your health care provider. Do not use any products that contain nicotine or tobacco. These products include cigarettes, chewing tobacco, and vaping devices, such as e-cigarettes. If you need help quitting, ask your health care provider. Do not use drugs. General instructions Take over-the-counter and prescription medicines only as told by your health care provider. Manage other health conditions as told. Keep all follow-up visits. This is important. Contact a health care provider if you have: An irregular heartbeat. Unexplained tiredness (fatigue). Trouble urinating, or you are producing less urine or foamy urine. Swelling of your hands or feet, or itchy skin. Unexplained pain or numbness in your legs or hips. A wound that is slow to heal or is not healing. Get help right away if: You have any symptoms of a heart attack. These may be: Chest pain. This includes squeezing chest pain that may feel like indigestion (angina). Shortness of breath. Pain in your neck, jaw, arms, back, or stomach. Cold sweat. Nausea. Light-headedness. Sudden pain, numbness, or coldness in a limb. You have any symptoms of a stroke. "BE FAST" is an easy way to remember the main warning signs of a stroke: B - Balance. Signs are dizziness, sudden trouble walking, or loss of balance. E - Eyes. Signs are trouble seeing or a sudden change in vision. F - Face. Signs are sudden weakness or numbness of the face, or the face or eyelid drooping on one side. A - Arms. Signs are weakness or numbness in an arm. This happens suddenly and usually on one side of the body. S -  Speech. Signs are sudden trouble speaking, slurred speech, or trouble understanding what people say. T - Time. Time to call emergency services. Write down what time symptoms started. You have other signs of a stroke, such as: A sudden, severe headache with no known cause. Nausea or vomiting. Seizure. These  symptoms may represent a serious problem that is an emergency. Do not wait to see if the symptoms will go away. Get medical help right away. Call your local emergency services (911 in the [place] Do not drive yourself to the hospital. Summary Atherosclerosis is when plaque builds up in the arteries and causes narrowing and hardening of the arteries. Plaque occurs due to inflammation or from a buildup of fat, cholesterol, calcium, cellular waste products, and fibrin. This condition may not cause any symptoms. Symptoms of atherosclerosis do not occur until the plaque severely narrows or blocks the artery. Treatment starts with lifestyle changes and may include medicines. In some cases, surgery is needed. Get help right away if you have any symptoms of a heart attack or stroke. This information is not intended to replace advice given to you by your health care provider. Make sure you discuss any questions you have with your health care provider. Document Revised: 11/26/2020 Document Reviewed: 11/26/2020 Elsevier Patient Education  2024 [employer].

## 2024-05-19 LAB — COMPREHENSIVE METABOLIC PANEL WITH GFR
AG Ratio: 1.9 (calc) (ref 1.0–2.5)
ALT: 13 U/L (ref 6–29)
AST: 20 U/L (ref 10–35)
Albumin: 4.5 g/dL (ref 3.6–5.1)
Alkaline phosphatase (APISO): 51 U/L (ref 37–153)
BUN/Creatinine Ratio: 16 (calc) (ref 6–22)
BUN: 21 mg/dL (ref 7–25)
CO2: 28 mmol/L (ref 20–32)
Calcium: 9.7 mg/dL (ref 8.6–10.4)
Chloride: 106 mmol/L (ref 98–110)
Creat: 1.34 mg/dL — ABNORMAL HIGH (ref 0.60–0.95)
Globulin: 2.4 g/dL (ref 1.9–3.7)
Glucose, Bld: 112 mg/dL — ABNORMAL HIGH (ref 65–99)
Potassium: 5.2 mmol/L (ref 3.5–5.3)
Sodium: 141 mmol/L (ref 135–146)
Total Bilirubin: 0.5 mg/dL (ref 0.2–1.2)
Total Protein: 6.9 g/dL (ref 6.1–8.1)
eGFR: 38 mL/min/1.73m2 — ABNORMAL LOW (ref >=60)

## 2024-05-19 LAB — LIPID PANEL
Cholesterol: 168 mg/dL (ref <200)
HDL: 57 mg/dL (ref >=50)
LDL Cholesterol (Calc): 86 mg/dL
Non-HDL Cholesterol (Calc): 111 mg/dL (ref <130)
Total CHOL/HDL Ratio: 2.9 (calc) (ref <5.0)
Triglycerides: 156 mg/dL — ABNORMAL HIGH (ref <150)

## 2024-05-19 LAB — CBC
HCT: 34.2 % — ABNORMAL LOW (ref 35.0–45.0)
Hemoglobin: 10.2 g/dL — ABNORMAL LOW (ref 11.7–15.5)
MCH: 25 pg — ABNORMAL LOW (ref 27.0–33.0)
MCHC: 29.8 g/dL — ABNORMAL LOW (ref 32.0–36.0)
MCV: 83.8 fL (ref 80.0–100.0)
MPV: 11.9 fL (ref 7.5–12.5)
Platelets: 211 Thousand/uL (ref 140–400)
RBC: 4.08 Million/uL (ref 3.80–5.10)
RDW: 14.9 % (ref 11.0–15.0)
WBC: 5.3 Thousand/uL (ref 3.8–10.8)

## 2024-05-19 LAB — HEMOGLOBIN A1C
Hgb A1c MFr Bld: 5.9 % — ABNORMAL HIGH (ref <5.7)
Mean Plasma Glucose: 123 mg/dL
eAG (mmol/L): 6.8 mmol/L

## 2024-05-21 ENCOUNTER — Ambulatory Visit: Payer: Self-pay | Admitting: Internal Medicine

## 2024-05-29 ENCOUNTER — Encounter (INDEPENDENT_AMBULATORY_CARE_PROVIDER_SITE_OTHER): Payer: Self-pay | Admitting: Vascular Surgery

## 2024-05-29 NOTE — Progress Notes (Signed)
 Subjective:    Patient ID: Angela Duffy, female    DOB: 1935/07/19, 89 y.o.   MRN: [id number] Chief Complaint  Patient presents with   Follow-up    ultrasound    Angela Duffy is a 89 y.o. (October 22, 1934) female who presents with chief complaint of check circulation.   History of Present Illness:    The patient returns to the office for followup and review of the noninvasive studies.    There have been interval changes in lower extremity symptoms. Positive interval shortening of the patient's claudication distance or development of rest pain symptoms. No new ulcers or wounds have occurred since the last visit.   There have been no significant changes to the patient's overall health care.  However she does note that her blood pressure has been a bit more erratic and a bit harder to control.   The patient denies amaurosis fugax or recent TIA symptoms. There are no documented recent neurological changes noted. There is no history of DVT, PE or superficial thrombophlebitis. The patient denies recent episodes of angina or shortness of breath.    ABI Rt= Non Compressible  and Lt= Non Compressible  (previous ABI's ABI Rt=0.68 and Lt=0.75 ) Left Lower extremity now with mono Phasic Wave forms     Review of Systems  Constitutional: Negative.   Musculoskeletal:  Positive for myalgias.       Objective:   Physical Exam Vitals reviewed.  Constitutional:      Appearance: Normal appearance. She is normal weight.  HENT:     Head: Normocephalic.  Eyes:     Pupils: Pupils are equal, round, and reactive to light.  Cardiovascular:     Rate and Rhythm: Normal rate and regular rhythm.     Pulses: Normal pulses.     Heart sounds: Normal heart sounds.  Pulmonary:     Effort: Pulmonary effort is normal.     Breath sounds: Normal breath sounds.  Abdominal:     General: Abdomen is flat. Bowel sounds are normal.     Palpations: Abdomen is soft.  Musculoskeletal:        General:  Tenderness present.     Comments: Positive for pain at rest as well as exercise in bilateral lower extremities.  Skin:    General: Skin is dry.     Capillary Refill: Capillary refill takes less than 2 seconds.     Comments: Bilateral lower extremities cool to touch.  Neurological:     General: No focal deficit present.     Mental Status: She is alert and oriented to person, place, and time. Mental status is at baseline.  Psychiatric:        Mood and Affect: Mood normal.        Behavior: Behavior normal.        Thought Content: Thought content normal.        Judgment: Judgment normal.     BP 126/68 (BP Location: Right Arm, Cuff Size: Normal)   Resp 17   Ht 5' 1 (1.549 m)   Wt 143 lb (64.9 kg)   BMI 27.02 kg/m   Past Medical History:  Diagnosis Date   Atherosclerosis    on Chest CT 08/2014; 70-80% innominate artery, right subclavian artery, 75% celiac artery, 3 vessel coronary artery involvement   CKD (chronic kidney disease) stage 3, GFR 30-59 ml/min (HCC)    Coronary atherosclerosis    History of tobacco use    Hyperlipidemia    Hypertension  IFG (impaired fasting glucose)    Left ventricular diastolic dysfunction    Left ventricular hypertrophy    Mitral valve regurgitation    Obesity    PVD (peripheral vascular disease)    Renal artery atherosclerosis    greater than 60% stenosis right renal artery   Tricuspid regurgitation    Vitamin B12 deficiency    Vitamin D  deficiency disease     Social History   Socioeconomic History   Marital status: Divorced    Spouse name: Not on file   Number of children: 3   Years of education: Not on file   Highest education level: 11th grade  Occupational History    Employer: RETIRED  Tobacco Use   Smoking status: Former    Current packs/day: 0.00    Average packs/day: 2.0 packs/day for 64.0 years (128.0 ttl pk-yrs)    Types: Cigarettes    Start date: 04/14/1950    Quit date: 04/14/2014    Years since quitting: 10.1    Smokeless tobacco: Never   Tobacco comments:    smoking cessation materials not required  Vaping Use   Vaping status: Never Used  Substance and Sexual Activity   Alcohol use: Not Currently   Drug use: No   Sexual activity: Not Currently  Other Topics Concern   Not on file  Social History Narrative   Not on file   Social Drivers of Health   Financial Resource Strain: Low Risk  (01/26/2024)   Overall Financial Resource Strain (CARDIA)    Difficulty of Paying Living Expenses: Not hard at all  Food Insecurity: No Food Insecurity (01/26/2024)   Hunger Vital Sign    Worried About Running Out of Food in the Last Year: Never true    Ran Out of Food in the Last Year: Never true  Transportation Needs: No Transportation Needs (01/26/2024)   PRAPARE - Administrator, Civil Service (Medical): No    Lack of Transportation (Non-Medical): No  Physical Activity: Inactive (01/26/2024)   Exercise Vital Sign    Days of Exercise per Week: 0 days    Minutes of Exercise per Session: 0 min  Stress: No Stress Concern Present (01/26/2024)   [employer] of Occupational Health - Occupational Stress Questionnaire    Feeling of Stress : Not at all  Social Connections: Socially Isolated (01/26/2024)   Social Connection and Isolation Panel    Frequency of Communication with Friends and Family: More than three times a week    Frequency of Social Gatherings with Friends and Family: More than three times a week    Attends Religious Services: Never    Database administrator or Organizations: No    Attends Banker Meetings: Never    Marital Status: Divorced  Catering manager Violence: Not At Risk (01/26/2024)   Humiliation, Afraid, Rape, and Kick questionnaire    Fear of Current or Ex-Partner: No    Emotionally Abused: No    Physically Abused: No    Sexually Abused: No    Past Surgical History:  Procedure Laterality Date   CAROTID SURGERY  Feb 2004   CERVICAL DISCECTOMY  1970s    ILIAC ARTERY STENT  2003   KIDNEY STENT  April 2016   LAPAROSCOPY  1970s    Family History  Problem Relation Age of Onset   Cancer Mother        colon   Emphysema Father    Heart disease Father    Heart attack Father  Cancer Sister 24       breast   Lupus Sister    Heart disease Sister    Multiple sclerosis Brother    Hypertension Sister    Cancer Daughter        breast   Cancer Paternal Grandmother        female    Allergies  Allergen Reactions   Cozaar [Losartan] Other (See Comments)    cramps   Elemental Sulfur Diarrhea and Nausea And Vomiting   Felodipine Swelling   Lisinopril Other (See Comments)   Metoprolol Swelling   Neomycin Rash   Nickel Rash   Penicillins Rash       Latest Ref Rng & Units 05/18/2024   10:25 AM 11/15/2023   11:34 AM 04/28/2023    1:29 PM  CBC  WBC 3.8 - 10.8 Thousand/uL 5.3  4.3  5.0   Hemoglobin 11.7 - 15.5 g/dL 89.7  89.9  9.6   Hematocrit 35.0 - 45.0 % 34.2  32.7  31.0   Platelets 140 - 400 Thousand/uL 211  200  203       CMP     Component Value Date/Time   NA 141 05/18/2024 1025   NA 142 09/25/2015 1135   K 5.2 05/18/2024 1025   CL 106 05/18/2024 1025   CO2 28 05/18/2024 1025   GLUCOSE 112 (H) 05/18/2024 1025   BUN 21 05/18/2024 1025   BUN 12 09/25/2015 1135   BUN 18 12/17/2014 1418   CREATININE 1.34 (H) 05/18/2024 1025   CALCIUM  9.7 05/18/2024 1025   PROT 6.9 05/18/2024 1025   PROT 6.7 09/25/2015 1135   ALBUMIN 4.3 12/28/2016 1043   ALBUMIN 4.1 09/25/2015 1135   AST 20 05/18/2024 1025   ALT 13 05/18/2024 1025   ALKPHOS 55 12/28/2016 1043   BILITOT 0.5 05/18/2024 1025   BILITOT 0.3 09/25/2015 1135   EGFR 38 (L) 05/18/2024 1025   GFRNONAA >60 02/12/2021 1125   GFRNONAA 59 (L) 12/04/2019 0933     VAS US  ABI WITH/WO TBI Result Date: 05/11/2024  LOWER EXTREMITY DOPPLER STUDY Patient Name:  AVIYAH SWETZ  Date of Exam:   05/10/2024 Medical Rec #: [id number]             Accession #:    [id number] Date of  Birth: 1935/06/04             Patient Gender: F Patient Age:   75 years Exam Location:  [facility] Procedure:      VAS US  ABI WITH/WO TBI Referring Phys: --------------------------------------------------------------------------------  Indications: Claudication, rest pain, and peripheral artery disease.  Performing Technologist: Jerel Croak RVT  Examination Guidelines: A complete evaluation includes at minimum, Doppler waveform signals and systolic blood pressure reading at the level of bilateral brachial, anterior tibial, and posterior tibial arteries, when vessel segments are accessible. Bilateral testing is considered an integral part of a complete examination. Photoelectric Plethysmograph (PPG) waveforms and toe systolic pressure readings are included as required and additional duplex testing as needed. Limited examinations for reoccurring indications may be performed as noted.  ABI Findings: +---------+------------------+-----+----------+--------+ Right    Rt Pressure (mmHg)IndexWaveform  Comment  +---------+------------------+-----+----------+--------+ Brachial 133                                       +---------+------------------+-----+----------+--------+ PTA      150  1.11 monophasic         +---------+------------------+-----+----------+--------+ DP       255               1.89 biphasic  NonComp  +---------+------------------+-----+----------+--------+ Great Toe57                0.42 Normal             +---------+------------------+-----+----------+--------+ +---------+------------------+-----+----------+-------+ Left     Lt Pressure (mmHg)IndexWaveform  Comment +---------+------------------+-----+----------+-------+ Brachial 135                                      +---------+------------------+-----+----------+-------+ PTA      158               1.17 monophasic        +---------+------------------+-----+----------+-------+ DP        255               1.89 monophasic        +---------+------------------+-----+----------+-------+ Great Toe88                0.65                   +---------+------------------+-----+----------+-------+ +-------+-----------+-----------+------------+------------+ ABI/TBIToday's ABIToday's TBIPrevious ABIPrevious TBI +-------+-----------+-----------+------------+------------+ Right  NonComp    .42        NonComp     .45          +-------+-----------+-----------+------------+------------+ Left   NonComp    .65        NonComp     .45          +-------+-----------+-----------+------------+------------+ Left TBIs appear increased.  Summary: Right: Resting right ankle-brachial index indicates noncompressible right lower extremity arteries. The right toe-brachial index is abnormal.  Left: Resting left ankle-brachial index indicates noncompressible left lower extremity arteries. The left toe-brachial index is abnormal.  *See table(s) above for measurements and observations.  Electronically signed by Cordella Shawl MD on 05/11/2024 at 10:42:28 AM.    Final        Assessment & Plan:   1. PAD (peripheral artery disease) (Primary) Recommend:  The patient has experienced increased claudication symptoms and is now describing lifestyle limiting claudication and appears to be having mild rest pain symptroms.  Given the severity of the patient's severe left lower extremity symptoms the patient should undergo angiography with the hope for intervention.  Risk and benefits were reviewed the patient.  Indications for the procedure were reviewed.  All questions were answered, the patient agrees to proceed with left lower extremity angiography and possible intervention.   The patient should continue walking and begin a more formal exercise program.  The patient should continue antiplatelet therapy and aggressive treatment of the lipid abnormalities  The patient will follow up with me after the  angiogram.  2. Benign essential hypertension Continue antihypertensive medications as already ordered, these medications have been reviewed and there are no changes at this time.    3. Atherosclerosis of native coronary artery of native heart without angina pectoris Continue cardiac and antihypertensive medications as already ordered and reviewed, no changes at this time.   Continue statin as ordered and reviewed, no changes at this time   Nitrates PRN for chest pain  4. Right subclavian artery occlusion Recommend:   The patient has a history of atherosclerosis of the right upper extremity without claudication.  The patient does not voice lifestyle limiting  changes at this point in time.  This appears to remain asymptomatic.   Noninvasive studies do not suggest clinically significant change.  In fact, the ABI performed today shows her systolic blood pressures are equal in both the right and left arms.   No invasive studies, angiography or surgery at this time The patient should continue walking and begin a more formal exercise program.  The patient should continue antiplatelet therapy and aggressive treatment of the lipid abnormalities   No changes in the patient's medications at this time   Continued surveillance is indicated as atherosclerosis is likely to progress with time.     The patient will continue follow up with noninvasive studies as ordered.   5. Mixed hyperlipidemia Continue statin as ordered and reviewed, no changes at this time    Current Outpatient Medications on File Prior to Visit  Medication Sig Dispense Refill   amLODipine -olmesartan  (AZOR ) 10-40 MG tablet Take 1 tablet by mouth daily. 90 tablet 1   Bevacizumab  (AVASTIN  IV) Inject 1.25 mg into the eye every 6 (six) weeks. evry 12 weeks     clopidogrel  (PLAVIX ) 75 MG tablet Take 1 tablet (75 mg total) by mouth daily. 90 tablet 1   Coenzyme Q10 (CO Q-10) 100 MG CAPS Take 100 mg by mouth.     Magnesium  Oxide  -Mg Supplement 500 MG CAPS Take 500 mg by mouth daily. (Patient taking differently: Take 100 mg by mouth daily.)     rosuvastatin  (CRESTOR ) 40 MG tablet Take 1 tablet (40 mg total) by mouth daily. 90 tablet 1   No current facility-administered medications on file prior to visit.    There are no Patient Instructions on file for this visit. No follow-ups on file.   Gwendlyn JONELLE Shank, NP

## 2024-05-31 ENCOUNTER — Telehealth (INDEPENDENT_AMBULATORY_CARE_PROVIDER_SITE_OTHER): Payer: Self-pay

## 2024-05-31 NOTE — Telephone Encounter (Signed)
 Spoke with the patient to schedule her for a LLE angio with Dr. Jama. Per the patient she is not sure if she will do another procedure, she is talking with her son to decide. The patient stated she will call if she wishes to schedule her procedure.

## 2024-06-25 ENCOUNTER — Ambulatory Visit (INDEPENDENT_AMBULATORY_CARE_PROVIDER_SITE_OTHER): Admitting: Internal Medicine

## 2024-06-25 ENCOUNTER — Encounter: Payer: Self-pay | Admitting: Internal Medicine

## 2024-06-25 VITALS — BP 136/62 | HR 84 | Temp 97.8°F | Ht 61.0 in | Wt 134.0 lb

## 2024-06-25 DIAGNOSIS — J3089 Other allergic rhinitis: Secondary | ICD-10-CM | POA: Diagnosis not present

## 2024-06-25 DIAGNOSIS — R052 Subacute cough: Secondary | ICD-10-CM | POA: Diagnosis not present

## 2024-06-25 MED ORDER — PROMETHAZINE-DM 6.25-15 MG/5ML PO SYRP
2.5000 mL | ORAL_SOLUTION | Freq: Every evening | ORAL | 0 refills | Status: AC | PRN
Start: 2024-06-25 — End: ?

## 2024-06-25 NOTE — Patient Instructions (Signed)
 Allergic Rhinitis, Adult  Allergic rhinitis is a reaction to allergens. Allergens are things that can cause an allergic reaction. This condition affects the lining inside the nose (mucous membrane). There are two types of allergic rhinitis: Seasonal. This type is also called hay fever. It happens only during some times of the year. Perennial. This type can happen at any time of the year. This condition cannot be spread from person to person (is not contagious). It can be mild, bad, or very bad. It can develop at any age and may be outgrown. What are the causes? Pollen from grasses, trees, and weeds. Other causes can be: Dust mites. Smoke. Mold. Car fumes. The pee (urine), spit, or dander of pets. Dander is dead skin cells from a pet. What increases the risk? You are more likely to develop this condition if: You have allergies in your family. You have problems like allergies in your family. You may have: Swelling of parts of your eyes and eyelids. Asthma. This affects how you breathe. Long-term redness and swelling on your skin. Food allergies. What are the signs or symptoms? The main symptom of this condition is a runny or stuffy nose (nasal congestion). Other symptoms may include: Sneezing or coughing. Itching and tearing of your eyes. Mucus that drips down the back of your throat (postnasal drip). This may cause a sore throat. Trouble sleeping. Feeling tired. Headache. How is this treated? There is no cure for this condition. You should avoid things that you are allergic to. Treatment can help to relieve symptoms. This may include: Medicines that block allergy symptoms, such as anti-inflammatories or antihistamines. These may be given as a shot, nasal spray, or pill. Avoiding things you are allergic to. Medicines that give you some of what you are allergic to over time. This is called immunotherapy. It is done if other treatments do not help. You may get: Shots. Medicine under  your tongue. Stronger medicines, if other treatments do not help. Follow these instructions at home: Avoiding allergens Find out what things you are allergic to and avoid them. To do this, try these things: If you get allergies any time of year: Replace carpet with wood, tile, or vinyl flooring. Carpet can trap pet dander and dust. Do not smoke. Do not allow smoking in your home. Change your heating and air conditioning filters at least once a month. If you get allergies only some times of the year: Keep windows closed when you can. Plan things to do outside when pollen counts are lowest. Check pollen counts before you plan things to do outside. When you come indoors, change your clothes and shower before you sit on furniture or bedding. If you are allergic to a pet: Keep the pet out of your bedroom. Vacuum, sweep, and dust often. General instructions Take over-the-counter and prescription medicines only as told by your doctor. Drink enough fluid to keep your pee pale yellow. Where to find more information American Academy of Allergy, Asthma & Immunology: aaaai.org Contact a doctor if: You have a fever. You get a cough that does not go away. You make high-pitched whistling sounds when you breathe, most often when you breathe out (wheeze). Your symptoms slow you down. Your symptoms stop you from doing your normal things each day. Get help right away if: You are short of breath. This symptom may be an emergency. Get help right away. Call 911. Do not wait to see if the symptom will go away. Do not drive yourself to the  hospital. This information is not intended to replace advice given to you by your health care provider. Make sure you discuss any questions you have with your health care provider. Document Revised: 05/03/2022 Document Reviewed: 05/03/2022 Elsevier Patient Education  2024 [employer].

## 2024-06-25 NOTE — Progress Notes (Signed)
 Subjective:    Patient ID: Blima JAYSON Flood, female    DOB: [birth year], 89 y.o.   MRN: [id number]  HPI  Discussed the use of AI scribe software for clinical note transcription with the patient, who gave verbal consent to proceed.  JORIE ZEE is an 89 year old female who presents with persistent cough and dizziness.  She has been experiencing a persistent cough for the past two weeks, which is productive with clear sputum and worsens when she lies down at night. Initially, she used coricidin until Saturday morning and then switched to m, which she feels has helped alleviate the congestion.  She also experiences dizziness described as vertigo, causing some instability. No headache, sore throat, or shortness of breath, although her son notes shortness of breath upon exertion. She has a runny nose with postnasal drip and reports that she typically experiences allergies during this time of year due to ragweed and leaves. She has had a low-grade fever of 99.51F but denies chills, body aches, or gastrointestinal symptoms.  Her son was recently ill, and she believes she contracted the illness from him, who got it from his elderly neighbor.       Review of Systems   Past Medical History:  Diagnosis Date   Atherosclerosis    on Chest CT 08/2014; 70-80% innominate artery, right subclavian artery, 75% celiac artery, 3 vessel coronary artery involvement   CKD (chronic kidney disease) stage 3, GFR 30-59 ml/min (HCC)    Coronary atherosclerosis    History of tobacco use    Hyperlipidemia    Hypertension    IFG (impaired fasting glucose)    Left ventricular diastolic dysfunction    Left ventricular hypertrophy    Mitral valve regurgitation    Obesity    PVD (peripheral vascular disease)    Renal artery atherosclerosis    greater than 60% stenosis right renal artery   Tricuspid regurgitation    Vitamin B12 deficiency    Vitamin D  deficiency disease     Current Outpatient  Medications  Medication Sig Dispense Refill   amLODipine -olmesartan  (AZOR ) 10-40 MG tablet Take 1 tablet by mouth daily. 90 tablet 1   Bevacizumab  (AVASTIN  IV) Inject 1.25 mg into the eye every 6 (six) weeks. evry 12 weeks     clopidogrel  (PLAVIX ) 75 MG tablet Take 1 tablet (75 mg total) by mouth daily. 90 tablet 1   Coenzyme Q10 (CO Q-10) 100 MG CAPS Take 100 mg by mouth.     Magnesium  Oxide -Mg Supplement 500 MG CAPS Take 500 mg by mouth daily. (Patient taking differently: Take 100 mg by mouth daily.)     rosuvastatin  (CRESTOR ) 40 MG tablet Take 1 tablet (40 mg total) by mouth daily. 90 tablet 1   No current facility-administered medications for this visit.    Allergies  Allergen Reactions   Cozaar [Losartan] Other (See Comments)    cramps   Elemental Sulfur Diarrhea and Nausea And Vomiting   Felodipine Swelling   Lisinopril Other (See Comments)   Metoprolol Swelling   Neomycin Rash   Nickel Rash   Penicillins Rash    Family History  Problem Relation Age of Onset   Cancer Mother        colon   Emphysema Father    Heart disease Father    Heart attack Father    Cancer Sister 27       breast   Lupus Sister    Heart disease Sister    Multiple  sclerosis Brother    Hypertension Sister    Cancer Daughter        breast   Cancer Paternal Grandmother        female    Social History   Socioeconomic History   Marital status: Divorced    Spouse name: Not on file   Number of children: 3   Years of education: Not on file   Highest education level: 11th grade  Occupational History    Employer: RETIRED  Tobacco Use   Smoking status: Former    Current packs/day: 0.00    Average packs/day: 2.0 packs/day for 64.0 years (128.0 ttl pk-yrs)    Types: Cigarettes    Start date: 04/14/1950    Quit date: 04/14/2014    Years since quitting: 10.2   Smokeless tobacco: Never   Tobacco comments:    smoking cessation materials not required  Vaping Use   Vaping status: Never Used   Substance and Sexual Activity   Alcohol use: Not Currently   Drug use: No   Sexual activity: Not Currently  Other Topics Concern   Not on file  Social History Narrative   Not on file   Social Drivers of Health   Financial Resource Strain: Low Risk  (01/26/2024)   Overall Financial Resource Strain (CARDIA)    Difficulty of Paying Living Expenses: Not hard at all  Food Insecurity: No Food Insecurity (01/26/2024)   Hunger Vital Sign    Worried About Running Out of Food in the Last Year: Never true    Ran Out of Food in the Last Year: Never true  Transportation Needs: No Transportation Needs (01/26/2024)   PRAPARE - Administrator, Civil Service (Medical): No    Lack of Transportation (Non-Medical): No  Physical Activity: Inactive (01/26/2024)   Exercise Vital Sign    Days of Exercise per Week: 0 days    Minutes of Exercise per Session: 0 min  Stress: No Stress Concern Present (01/26/2024)   [employer] of Occupational Health - Occupational Stress Questionnaire    Feeling of Stress : Not at all  Social Connections: Socially Isolated (01/26/2024)   Social Connection and Isolation Panel    Frequency of Communication with Friends and Family: More than three times a week    Frequency of Social Gatherings with Friends and Family: More than three times a week    Attends Religious Services: Never    Database administrator or Organizations: No    Attends Banker Meetings: Never    Marital Status: Divorced  Catering manager Violence: Not At Risk (01/26/2024)   Humiliation, Afraid, Rape, and Kick questionnaire    Fear of Current or Ex-Partner: No    Emotionally Abused: No    Physically Abused: No    Sexually Abused: No     Constitutional: Patient reports fever.  Denies malaise, fatigue, headache or abrupt weight changes.  HEENT: Pt reports reports runny nose and post nasal drip. Denies eye pain, eye redness, ear pain, ringing in the ears, wax buildup, nasal  congestion, bloody nose, or sore throat. Respiratory: Patient reports cough.  Denies difficulty breathing, shortness of breath.   Cardiovascular: Denies chest pain, chest tightness, palpitations or swelling in the hands or feet.  Gastrointestinal: Denies abdominal pain, bloating, constipation, diarrhea or blood in the stool.  GU: Denies urgency, frequency, pain with urination, burning sensation, blood in urine, odor or discharge. Musculoskeletal: Denies decrease in range of motion, difficulty with gait, muscle pain  or joint pain and swelling.  Skin: Denies redness, rashes, lesions or ulcercations.  Neurological: Pt reports dizziness. Denies difficulty with memory, difficulty with speech or problems with balance and coordination.   No other specific complaints in a complete review of systems (except as listed in HPI above).      Objective:   Physical Exam BP (!) 142/58 (BP Location: Left Arm, Patient Position: Sitting, Cuff Size: Normal)   Pulse 84   Temp 97.8 F (36.6 C)   Ht 5' 1 (1.549 m)   Wt 134 lb (60.8 kg)   SpO2 97%   BMI 25.32 kg/m   Wt Readings from Last 3 Encounters:  05/18/24 143 lb (64.9 kg)  05/10/24 143 lb (64.9 kg)  01/26/24 147 lb (66.7 kg)    General: Appears her stated age, well developed, well nourished in NAD. Skin: Warm, dry and intact.  HEENT: Head: normal shape and size, no sinus tenderness noted; Eyes: sclera white, no icterus, conjunctiva pink, PERRLA and EOMs intact;  Nose: mucosa pink and moist, septum midline; Throat/Mouth: Teeth present, mucosa pink and moist, + PND, no exudate, lesions or ulcerations noted.  Neck: No adenopathy noted. Cardiovascular: Normal rate and rhythm. S1,S2 noted.  Murmur noted. Pulmonary/Chest: Normal effort and positive vesicular breath sounds. No respiratory distress. No wheezes, rales or ronchi noted.  Musculoskeletal: No difficulty with gait.  Neurological: Alert and oriented.   BMET    Component Value Date/Time    NA 141 05/18/2024 1025   NA 142 09/25/2015 1135   K 5.2 05/18/2024 1025   CL 106 05/18/2024 1025   CO2 28 05/18/2024 1025   GLUCOSE 112 (H) 05/18/2024 1025   BUN 21 05/18/2024 1025   BUN 12 09/25/2015 1135   BUN 18 12/17/2014 1418   CREATININE 1.34 (H) 05/18/2024 1025   CALCIUM  9.7 05/18/2024 1025   GFRNONAA >60 02/12/2021 1125   GFRNONAA 59 (L) 12/04/2019 0933   GFRAA 68 12/04/2019 0933    Lipid Panel     Component Value Date/Time   CHOL 168 05/18/2024 1025   CHOL 218 (H) 09/25/2015 1135   TRIG 156 (H) 05/18/2024 1025   HDL 57 05/18/2024 1025   HDL 50 09/25/2015 1135   CHOLHDL 2.9 05/18/2024 1025   VLDL 22 04/18/2017 0909   LDLCALC 86 05/18/2024 1025    CBC    Component Value Date/Time   WBC 5.3 05/18/2024 1025   RBC 4.08 05/18/2024 1025   HGB 10.2 (L) 05/18/2024 1025   HGB 11.1 09/25/2015 1135   HCT 34.2 (L) 05/18/2024 1025   HCT 34.7 09/25/2015 1135   PLT 211 05/18/2024 1025   PLT 258 09/25/2015 1135   MCV 83.8 05/18/2024 1025   MCV 82 09/25/2015 1135   MCH 25.0 (L) 05/18/2024 1025   MCHC 29.8 (L) 05/18/2024 1025   RDW 14.9 05/18/2024 1025   RDW 16.8 (H) 09/25/2015 1135   LYMPHSABS 845 (L) 04/28/2023 1329   LYMPHSABS 0.8 09/25/2015 1135   MONOABS 468 12/28/2016 1043   EOSABS 20 04/28/2023 1329   EOSABS 0.1 09/25/2015 1135   BASOSABS 30 04/28/2023 1329   BASOSABS 0.0 09/25/2015 1135    Hgb A1C Lab Results  Component Value Date   HGBA1C 5.9 (H) 05/18/2024            Assessment & Plan:  Assessment and Plan    Allergic rhinitis with postnasal drip and cough Symptoms of rhinorrhea, postnasal drip, and nocturnal cough likely due to seasonal allergies. No bronchitis  or pneumonia. Mucinex not indicated. - Discontinue mucinex. - Start claritin 10 mg twice daily for three days, then once daily thereafter. - Rx for Promethazine DM cough syrup, at bedtime only-sedation caution given   RTC in 5 months, followup chronic conditions Angeline Laura,  NP

## 2024-06-29 ENCOUNTER — Other Ambulatory Visit: Payer: Self-pay | Admitting: Internal Medicine

## 2024-06-30 NOTE — Telephone Encounter (Signed)
 Requested Prescriptions  Pending Prescriptions Disp Refills   amLODipine -olmesartan  (AZOR ) 10-40 MG tablet [Pharmacy Med Name: AMLODIPINE -OLMESARTAN  10-40 MG] 90 tablet 0    Sig: Take 1 tablet by mouth daily.     Cardiovascular: CCB + ARB Combos Failed - 06/30/2024 10:11 PM      Failed - Cr in normal range and within 180 days    Creat  Date Value Ref Range Status  05/18/2024 1.34 (H) 0.60 - 0.95 mg/dL Final   Creatinine, Urine  Date Value Ref Range Status  06/18/2016 65 20 - 320 mg/dL Final         Passed - K in normal range and within 180 days    Potassium  Date Value Ref Range Status  05/18/2024 5.2 3.5 - 5.3 mmol/L Final         Passed - Na in normal range and within 180 days    Sodium  Date Value Ref Range Status  05/18/2024 141 135 - 146 mmol/L Final  09/25/2015 142 134 - 144 mmol/L Final         Passed - Patient is not pregnant      Passed - Last BP in normal range    BP Readings from Last 1 Encounters:  06/25/24 136/62         Passed - Valid encounter within last 6 months    Recent Outpatient Visits           5 days ago Seasonal allergic rhinitis due to other allergic trigger   [facility] [place], Angeline ORN, NP   1 month ago Prediabetes   [facility] [place], Angeline ORN, NP   7 months ago Prediabetes   [facility] [place], Angeline ORN, [place]

## 2024-07-11 ENCOUNTER — Encounter (INDEPENDENT_AMBULATORY_CARE_PROVIDER_SITE_OTHER): Admitting: Ophthalmology

## 2024-07-11 DIAGNOSIS — I10 Essential (primary) hypertension: Secondary | ICD-10-CM

## 2024-07-11 DIAGNOSIS — D3131 Benign neoplasm of right choroid: Secondary | ICD-10-CM

## 2024-07-11 DIAGNOSIS — H35033 Hypertensive retinopathy, bilateral: Secondary | ICD-10-CM | POA: Diagnosis not present

## 2024-07-11 DIAGNOSIS — H43813 Vitreous degeneration, bilateral: Secondary | ICD-10-CM

## 2024-07-11 DIAGNOSIS — H353231 Exudative age-related macular degeneration, bilateral, with active choroidal neovascularization: Secondary | ICD-10-CM | POA: Diagnosis not present

## 2024-10-03 ENCOUNTER — Encounter (INDEPENDENT_AMBULATORY_CARE_PROVIDER_SITE_OTHER): Admitting: Ophthalmology

## 2024-10-03 DIAGNOSIS — D3131 Benign neoplasm of right choroid: Secondary | ICD-10-CM | POA: Diagnosis not present

## 2024-10-03 DIAGNOSIS — H43813 Vitreous degeneration, bilateral: Secondary | ICD-10-CM | POA: Diagnosis not present

## 2024-10-03 DIAGNOSIS — H353231 Exudative age-related macular degeneration, bilateral, with active choroidal neovascularization: Secondary | ICD-10-CM | POA: Diagnosis not present

## 2024-10-03 DIAGNOSIS — I10 Essential (primary) hypertension: Secondary | ICD-10-CM | POA: Diagnosis not present

## 2024-10-03 DIAGNOSIS — H35033 Hypertensive retinopathy, bilateral: Secondary | ICD-10-CM | POA: Diagnosis not present

## 2024-11-15 ENCOUNTER — Ambulatory Visit: Admitting: Internal Medicine

## 2025-01-02 ENCOUNTER — Encounter (INDEPENDENT_AMBULATORY_CARE_PROVIDER_SITE_OTHER): Admitting: Ophthalmology

## 2025-02-01 ENCOUNTER — Ambulatory Visit

## 2025-02-06 ENCOUNTER — Ambulatory Visit
